# Patient Record
Sex: Male | Born: 1960
Health system: Southern US, Community
[De-identification: ages and names within clinical notes are randomized; demographics above are authoritative.]

## PROBLEM LIST (undated history)

## (undated) DIAGNOSIS — G5603 Carpal tunnel syndrome, bilateral upper limbs: Secondary | ICD-10-CM

## (undated) DIAGNOSIS — I10 Essential (primary) hypertension: Secondary | ICD-10-CM

## (undated) DIAGNOSIS — G4730 Sleep apnea, unspecified: Secondary | ICD-10-CM

## (undated) DIAGNOSIS — E7800 Pure hypercholesterolemia, unspecified: Secondary | ICD-10-CM

## (undated) DIAGNOSIS — I509 Heart failure, unspecified: Secondary | ICD-10-CM

## (undated) HISTORY — DX: Morbid (severe) obesity due to excess calories: E66.01

## (undated) HISTORY — DX: Carpal tunnel syndrome, bilateral upper limbs: G56.03

## (undated) HISTORY — DX: Sleep apnea, unspecified: G47.30

---

## 2009-01-28 HISTORY — PX: CARPAL TUNNEL RELEASE: SHX101

## 2010-09-09 ENCOUNTER — Emergency Department (INDEPENDENT_AMBULATORY_CARE_PROVIDER_SITE_OTHER): Payer: Self-pay

## 2010-09-09 ENCOUNTER — Emergency Department (HOSPITAL_BASED_OUTPATIENT_CLINIC_OR_DEPARTMENT_OTHER)
Admission: EM | Admit: 2010-09-09 | Discharge: 2010-09-09 | Disposition: A | Discharge status: Home / Self Care | Payer: Self-pay | Attending: Emergency Medicine | Admitting: Emergency Medicine

## 2010-09-09 ENCOUNTER — Emergency Department (HOSPITAL_BASED_OUTPATIENT_CLINIC_OR_DEPARTMENT_OTHER): Payer: Self-pay

## 2010-09-09 ENCOUNTER — Encounter: Payer: Self-pay | Admitting: Emergency Medicine

## 2010-09-09 DIAGNOSIS — M25569 Pain in unspecified knee: Secondary | ICD-10-CM

## 2010-09-09 DIAGNOSIS — M25461 Effusion, right knee: Secondary | ICD-10-CM

## 2010-09-09 DIAGNOSIS — M25469 Effusion, unspecified knee: Secondary | ICD-10-CM

## 2010-09-09 DIAGNOSIS — E7800 Pure hypercholesterolemia, unspecified: Secondary | ICD-10-CM | POA: Insufficient documentation

## 2010-09-09 DIAGNOSIS — R609 Edema, unspecified: Secondary | ICD-10-CM | POA: Insufficient documentation

## 2010-09-09 DIAGNOSIS — M898X9 Other specified disorders of bone, unspecified site: Secondary | ICD-10-CM

## 2010-09-09 DIAGNOSIS — I10 Essential (primary) hypertension: Secondary | ICD-10-CM | POA: Insufficient documentation

## 2010-09-09 HISTORY — DX: Essential (primary) hypertension: I10

## 2010-09-09 HISTORY — DX: Pure hypercholesterolemia, unspecified: E78.00

## 2010-09-09 MED ORDER — HYDROCODONE-ACETAMINOPHEN 5-325 MG PO TABS: 2.0000 | ORAL_TABLET | ORAL | Status: AC | PRN

## 2010-09-09 NOTE — ED Provider Notes (Signed)
Evaluation and management procedures were performed by the PA/NP under my supervision/collaboration.   Dione Booze, MD 09/09/10 [phone]

## 2010-09-09 NOTE — ED Notes (Signed)
Pt c/o RT knee pain x 4 days- no known injury.

## 2010-09-09 NOTE — ED Provider Notes (Signed)
History     CSN: [id number] Arrival date & time: 09/09/2010  1:09 PM  Chief Complaint  Patient presents with  . Knee Pain   Patient is a 50 y.o. male presenting with knee pain. The history is provided by the patient.  Knee Pain This is a new problem. The current episode started in the past 7 days. The problem occurs constantly. The problem has been gradually worsening. Associated symptoms include joint swelling and myalgias. Pertinent negatives include no weakness. The symptoms are aggravated by bending. He has tried nothing for the symptoms. The treatment provided no relief.    Past Medical History  Diagnosis Date  . Hypertension   . High cholesterol     Past Surgical History  Procedure Date  . Carpal tunnel release     No family history on file.  History  Substance Use Topics  . Smoking status: Never Smoker   . Smokeless tobacco: Not on file  . Alcohol Use: Yes     occ      Review of Systems  Musculoskeletal: Positive for myalgias and joint swelling.  Neurological: Negative for weakness.  All other systems reviewed and are negative.    Physical Exam  BP 157/106  Pulse 85  Temp(Src) 97.6 F (36.4 C) (Oral)  Resp 20  SpO2 98%  Physical Exam  Nursing note and vitals reviewed. Constitutional: He is oriented to person, place, and time. He appears well-developed and well-nourished.  HENT:  Head: Normocephalic and atraumatic.  Eyes: Pupils are equal, round, and reactive to light.  Neck: Normal range of motion.  Cardiovascular: Normal rate.   Pulmonary/Chest: Effort normal.  Musculoskeletal: He exhibits edema and tenderness.       Effusion knee,  Decreased range of motion,  No instability  Neurological: He is alert and oriented to person, place, and time. He has normal reflexes.  Skin: Skin is warm and dry.  Psychiatric: He has a normal mood and affect.    ED Course  Procedures  MDM Knee immbolizer,  Pt referred to Dr. Dion Saucier for evaluation        Langston Masker, [place] 09/09/10 1443

## 2012-03-29 ENCOUNTER — Encounter (HOSPITAL_BASED_OUTPATIENT_CLINIC_OR_DEPARTMENT_OTHER): Payer: Self-pay | Admitting: Emergency Medicine

## 2012-03-29 ENCOUNTER — Emergency Department (HOSPITAL_BASED_OUTPATIENT_CLINIC_OR_DEPARTMENT_OTHER)
Admission: EM | Admit: 2012-03-29 | Discharge: 2012-03-29 | Disposition: A | Discharge status: Home / Self Care | Payer: Self-pay | Attending: Emergency Medicine | Admitting: Emergency Medicine

## 2012-03-29 DIAGNOSIS — R05 Cough: Secondary | ICD-10-CM | POA: Insufficient documentation

## 2012-03-29 DIAGNOSIS — R0989 Other specified symptoms and signs involving the circulatory and respiratory systems: Secondary | ICD-10-CM | POA: Insufficient documentation

## 2012-03-29 DIAGNOSIS — R197 Diarrhea, unspecified: Secondary | ICD-10-CM | POA: Insufficient documentation

## 2012-03-29 DIAGNOSIS — J3489 Other specified disorders of nose and nasal sinuses: Secondary | ICD-10-CM | POA: Insufficient documentation

## 2012-03-29 DIAGNOSIS — J329 Chronic sinusitis, unspecified: Secondary | ICD-10-CM | POA: Insufficient documentation

## 2012-03-29 DIAGNOSIS — J069 Acute upper respiratory infection, unspecified: Secondary | ICD-10-CM | POA: Insufficient documentation

## 2012-03-29 DIAGNOSIS — Z862 Personal history of diseases of the blood and blood-forming organs and certain disorders involving the immune mechanism: Secondary | ICD-10-CM | POA: Insufficient documentation

## 2012-03-29 DIAGNOSIS — R059 Cough, unspecified: Secondary | ICD-10-CM | POA: Insufficient documentation

## 2012-03-29 DIAGNOSIS — R52 Pain, unspecified: Secondary | ICD-10-CM | POA: Insufficient documentation

## 2012-03-29 DIAGNOSIS — I10 Essential (primary) hypertension: Secondary | ICD-10-CM | POA: Insufficient documentation

## 2012-03-29 DIAGNOSIS — Z8639 Personal history of other endocrine, nutritional and metabolic disease: Secondary | ICD-10-CM | POA: Insufficient documentation

## 2012-03-29 DIAGNOSIS — R0609 Other forms of dyspnea: Secondary | ICD-10-CM | POA: Insufficient documentation

## 2012-03-29 MED ORDER — LEVOFLOXACIN 750 MG PO TABS: 750.0000 mg | ORAL_TABLET | Freq: Every day | ORAL | Status: DC

## 2012-03-29 MED ORDER — OXYMETAZOLINE HCL 0.05 % NA SOLN
1.0000 | Freq: Once | NASAL | Status: AC
Start: 2012-03-29 — End: 2012-03-29
  Administered 2012-03-29: 1 via NASAL
  Filled 2012-03-29: qty 15

## 2012-03-29 NOTE — ED Notes (Signed)
Pt having flu-like symptoms x 2 days.  Pt states he is having difficulty catching his breath.

## 2012-03-29 NOTE — ED Provider Notes (Signed)
History     CSN: [id number]  Arrival date & time 03/29/12  0725   First MD Initiated Contact with Patient 03/29/12 [phone]      Chief Complaint  Patient presents with  . Nasal Congestion  . Generalized Body Aches  . Diarrhea    (Consider location/radiation/quality/duration/timing/severity/associated sxs/prior treatment) HPI Comments: The patient presents with 2 days of persistent upper respiratory symptoms. Initially the patient had nasal congestion which has become severe and associated with sinus pressure and pain. He has now had an associated difficulty breathing because of a mild cough, diarrhea and generalized body aches. But has worse symptoms are nasal congestion and sinus pressure. Nothing seems to make this better or worse, there is no associated fevers or swelling or chest pain. He does not have any specific sick contacts of which she is aware. He is not a diabetic.  Patient is a 52 y.o. male presenting with diarrhea. The history is provided by the patient and the spouse.  Diarrhea   Past Medical History  Diagnosis Date  . Hypertension   . High cholesterol     Past Surgical History  Procedure Laterality Date  . Carpal tunnel release      No family history on file.  History  Substance Use Topics  . Smoking status: Never Smoker   . Smokeless tobacco: Not on file  . Alcohol Use: Yes     Comment: occ      Review of Systems  Gastrointestinal: Positive for diarrhea.  All other systems reviewed and are negative.    Allergies  Review of patient's allergies indicates no known allergies.  Home Medications   Current Outpatient Rx  Name  Route  Sig  Dispense  Refill  . levofloxacin (LEVAQUIN) 750 MG tablet   Oral   Take 1 tablet (750 mg total) by mouth daily.   7 tablet   0     BP 160/108  Pulse 99  Temp(Src) 99 F (37.2 C) (Oral)  Resp 24  Ht 5\' 10"  (1.778 m)  Wt 325 lb (147.419 kg)  BMI 46.63 kg/m2  SpO2 98%  Physical Exam  Nursing note and  vitals reviewed. Constitutional: He appears well-developed and well-nourished. No distress.  HENT:  Head: Normocephalic and atraumatic.  Oropharynx is erythematous but mucous membranes are moist. Tympanic membranes are clear bilaterally, nasal passages are significantly swollen with voluminous purulent and clear rhinorrhea. Tenderness over the sinuses bilaterally  Eyes: Conjunctivae and EOM are normal. Pupils are equal, round, and reactive to light. Right eye exhibits no discharge. Left eye exhibits no discharge. No scleral icterus.  Neck: Normal range of motion. Neck supple. No JVD present. No thyromegaly present.  Very supple neck, no lymphadenopathy  Cardiovascular: Normal rate, regular rhythm, normal heart sounds and intact distal pulses.  Exam reveals no gallop and no friction rub.   No murmur heard. Pulmonary/Chest: Effort normal and breath sounds normal. No respiratory distress. He has no wheezes. He has no rales.  Respiratory rate is normal, 18 on my exam, lungs are clear without wheezing rales or rhonchi, no increased work of breathing, speaks in full sentences  Abdominal: Soft. Bowel sounds are normal. He exhibits no distension and no mass. There is no tenderness.  Musculoskeletal: Normal range of motion. He exhibits no edema and no tenderness.  Lymphadenopathy:    He has no cervical adenopathy.  Neurological: He is alert. Coordination normal.  Skin: Skin is warm and dry. No rash noted. No erythema.  Psychiatric: He has  a normal mood and affect. His behavior is normal.    ED Course  Procedures (including critical care time)  Labs Reviewed - No data to display No results found.   1. Sinusitis   2. URI (upper respiratory infection)   3. Diarrhea       MDM  Vital signs reveal that the patient has mild hypertension, he does not have a fever nor is he tachycardic. His lungs are clear his oxygen level is 98% on room air. I will prescribe the patient a levofloxacin to treat a  possible sinus infection, the patient states that he gets very frequent sinus infections and with a sinus tenderness and significant nasal congestion he will need this. Afrin nasal spray has also been prescribed. His diarrhea is mild, he is under the understanding that this will not improved with the Levaquin but that he will need to drink plenty of fluids to stay hydrated. All questions have been answered, patient appears stable for discharge and has expressed his understanding for the indications for return.  Levaquin  Afrin        Vida Roller, MD 03/29/12 [phone]

## 2012-05-04 ENCOUNTER — Encounter: Payer: Self-pay | Admitting: Family Medicine

## 2012-05-04 ENCOUNTER — Other Ambulatory Visit: Payer: Self-pay

## 2012-05-04 ENCOUNTER — Ambulatory Visit (INDEPENDENT_AMBULATORY_CARE_PROVIDER_SITE_OTHER): Payer: Self-pay | Admitting: Family Medicine

## 2012-05-04 VITALS — BP 174/127 | HR 91 | Ht 70.0 in | Wt 356.0 lb

## 2012-05-04 DIAGNOSIS — J309 Allergic rhinitis, unspecified: Secondary | ICD-10-CM

## 2012-05-04 DIAGNOSIS — R0981 Nasal congestion: Secondary | ICD-10-CM

## 2012-05-04 DIAGNOSIS — J3489 Other specified disorders of nose and nasal sinuses: Secondary | ICD-10-CM

## 2012-05-04 DIAGNOSIS — I10 Essential (primary) hypertension: Secondary | ICD-10-CM

## 2012-05-04 DIAGNOSIS — G4710 Hypersomnia, unspecified: Secondary | ICD-10-CM

## 2012-05-04 MED ORDER — MOMETASONE FUROATE 50 MCG/ACT NA SUSP: 2.0000 | Freq: Every day | NASAL | Status: DC

## 2012-05-04 MED ORDER — MONTELUKAST SODIUM 10 MG PO TABS: 10.0000 mg | ORAL_TABLET | Freq: Every day | ORAL | Status: DC

## 2012-05-04 NOTE — Patient Instructions (Addendum)
1)  Head Congestion/Allergies:     A)  Neil Med Sinus Rinse - Distilled Water plus 1 blue packet in the 8 oz bottle. B)  Nasonex - 2 sprays inside each nostril twice a day C)  Antihistamines - Claritin, Zyrtec, Allegra (1-2 times per day) vs Chlorpheniramine/Benadryl - (1 up to 4 times per day)  D)  Decongestant - DO NOT USE DECONGESTANT SPRAY - Once we get your BP better you can try the phenylephrine 10 mg (2 tabs - 2 times per day)  E)  Singulair - 10 mg at night (Rx through Obamacare)    2)  Blood Pressure -   A) Start with the Tribenzor 1/2 tab daily for 7 days then increase to 1 tab.    3)  Blood Work - Fasting labs once your insurance is set.    DASH Diet The DASH diet stands for "Dietary Approaches to Stop Hypertension." It is a healthy eating plan that has been shown to reduce high blood pressure (hypertension) in as little as 14 days, while also possibly providing other significant health benefits. These other health benefits include reducing the risk of breast cancer after menopause and reducing the risk of type 2 diabetes, heart disease, colon cancer, and stroke. Health benefits also include weight loss and slowing kidney failure in patients with chronic kidney disease.  DIET GUIDELINES  Limit salt (sodium). Your diet should contain less than 1500 mg of sodium daily.  Limit refined or processed carbohydrates. Your diet should include mostly whole grains. Desserts and added sugars should be used sparingly.  Include small amounts of heart-healthy fats. These types of fats include nuts, oils, and tub margarine. Limit saturated and trans fats. These fats have been shown to be harmful in the body. CHOOSING FOODS  The following food groups are based on a 2000 calorie diet. See your Registered Dietitian for individual calorie needs. Grains and Grain Products (6 to 8 servings daily)  Eat More Often: Whole-wheat bread, brown rice, whole-grain or wheat pasta, quinoa, popcorn without  added fat or salt (air popped).  Eat Less Often: White bread, white pasta, white rice, cornbread. Vegetables (4 to 5 servings daily)  Eat More Often: Fresh, frozen, and canned vegetables. Vegetables may be raw, steamed, roasted, or grilled with a minimal amount of fat.  Eat Less Often/Avoid: Creamed or fried vegetables. Vegetables in a cheese sauce. Fruit (4 to 5 servings daily)  Eat More Often: All fresh, canned (in natural juice), or frozen fruits. Dried fruits without added sugar. One hundred percent fruit juice ( cup [237 mL] daily).  Eat Less Often: Dried fruits with added sugar. Canned fruit in light or heavy syrup. [employer], Fish, and Poultry (2 servings or less daily. One serving is 3 to 4 oz [85-114 g]).  Eat More Often: Ninety percent or leaner ground beef, tenderloin, sirloin. Round cuts of beef, chicken breast, [place] breast. All fish. Grill, bake, or broil your meat. Nothing should be fried.  Eat Less Often/Avoid: Fatty cuts of meat, [place], or chicken leg, thigh, or wing. Fried cuts of meat or fish. Dairy (2 to 3 servings)  Eat More Often: Low-fat or fat-free milk, low-fat plain or light yogurt, reduced-fat or part-skim cheese.  Eat Less Often/Avoid: Milk (whole, 2%).Whole milk yogurt. Full-fat cheeses. Nuts, Seeds, and Legumes (4 to 5 servings per week)  Eat More Often: All without added salt.  Eat Less Often/Avoid: Salted nuts and seeds, canned beans with added salt. Fats and Sweets (limited)  Eat More Often: Vegetable oils, tub margarines without trans fats, sugar-free gelatin. Mayonnaise and salad dressings.  Eat Less Often/Avoid: Coconut oils, palm oils, butter, stick margarine, cream, half and half, cookies, candy, pie. FOR MORE INFORMATION The Dash Diet Eating Plan: www.dashdiet.org Document Released: 01/03/2011 Document Revised: 04/08/2011 Document Reviewed: 01/03/2011 [facility] Patient Information 2013 [place], [place].

## 2012-05-04 NOTE — Progress Notes (Signed)
Subjective:     Patient ID: John Conner, male   DOB: 1960-09-24, 52 y.o.   MRN: [id number]  HPI John Conner is here today to establish care with our practice.  He found our name in the lobby of the [employer].  [facility].  He missed a few appointments with them and they discharged him from their practice.  He has been struggling with nasal/sinus congestion and difficulty breathing for about two years.  He has been diagnosed with hypertension in the past.    Review of Systems  Constitutional: Positive for fatigue (Excessive Sleepiness ).  HENT: Positive for congestion, rhinorrhea, sneezing, postnasal drip and sinus pressure.   Respiratory: Positive for apnea.   Cardiovascular: Negative for chest pain.       Objective:   Physical Exam  HENT:  Nose: Mucosal edema and rhinorrhea present.  Mouth/Throat: Oropharynx is clear and moist.  Neck: No thyromegaly present.  Cardiovascular: Normal rate, regular rhythm and normal heart sounds.   Pulmonary/Chest: Effort normal and breath sounds normal.  Lymphadenopathy:    He has no cervical adenopathy.       Assessment:     Sinus Congestion Allergic Rhinitis  Hypertension Sleep Apnea   Plan:     John Conner Med Sinus Rinse - Distilled Water plus 1 blue packet in the 8 oz bottle. B)  Nasonex - 2 sprays inside each nostril twice a day C)  Antihistamines - Claritin, Zyrtec, Allegra (1-2 times per day) vs Chlorpheniramine/Benadryl - (1 up to 4 times per day)  D)  Decongestant - DO NOT USE DECONGESTANT SPRAY - Once we get your BP better you can try the phenylephrine 10 mg (2 tabs - 2 times per day)  E)  Singulair - 10 mg at night (Rx through Obamacare)  F)  Tribenzor - 40/5/25 mg - Start with 1/2 tab and increase to 1 tab daily.   G)  Return for labs H)  We'll refer for a sleep study at his next visit.

## 2012-05-06 ENCOUNTER — Encounter: Payer: Self-pay | Admitting: Family Medicine

## 2012-05-06 DIAGNOSIS — R0981 Nasal congestion: Secondary | ICD-10-CM | POA: Insufficient documentation

## 2012-05-06 DIAGNOSIS — G4710 Hypersomnia, unspecified: Secondary | ICD-10-CM | POA: Insufficient documentation

## 2012-05-06 DIAGNOSIS — I10 Essential (primary) hypertension: Secondary | ICD-10-CM | POA: Insufficient documentation

## 2012-05-06 DIAGNOSIS — J309 Allergic rhinitis, unspecified: Secondary | ICD-10-CM | POA: Insufficient documentation

## 2012-06-03 ENCOUNTER — Other Ambulatory Visit: Payer: Self-pay

## 2012-06-10 ENCOUNTER — Ambulatory Visit: Payer: Self-pay | Admitting: Family Medicine

## 2012-06-10 DIAGNOSIS — Z0289 Encounter for other administrative examinations: Secondary | ICD-10-CM

## 2013-03-27 ENCOUNTER — Encounter (HOSPITAL_BASED_OUTPATIENT_CLINIC_OR_DEPARTMENT_OTHER): Payer: Self-pay | Admitting: Emergency Medicine

## 2013-03-27 ENCOUNTER — Emergency Department (HOSPITAL_BASED_OUTPATIENT_CLINIC_OR_DEPARTMENT_OTHER)
Admission: EM | Admit: 2013-03-27 | Discharge: 2013-03-27 | Disposition: A | Discharge status: Home / Self Care | Payer: Self-pay | Attending: Emergency Medicine | Admitting: Emergency Medicine

## 2013-03-27 DIAGNOSIS — L259 Unspecified contact dermatitis, unspecified cause: Secondary | ICD-10-CM | POA: Insufficient documentation

## 2013-03-27 DIAGNOSIS — Z8669 Personal history of other diseases of the nervous system and sense organs: Secondary | ICD-10-CM | POA: Insufficient documentation

## 2013-03-27 DIAGNOSIS — I10 Essential (primary) hypertension: Secondary | ICD-10-CM | POA: Insufficient documentation

## 2013-03-27 DIAGNOSIS — L309 Dermatitis, unspecified: Secondary | ICD-10-CM

## 2013-03-27 DIAGNOSIS — L853 Xerosis cutis: Secondary | ICD-10-CM

## 2013-03-27 DIAGNOSIS — Z87891 Personal history of nicotine dependence: Secondary | ICD-10-CM | POA: Insufficient documentation

## 2013-03-27 LAB — CBG MONITORING, ED: Glucose-Capillary: 117 mg/dL — ABNORMAL HIGH (ref 70–99)

## 2013-03-27 NOTE — ED Notes (Signed)
Patient here with tingling to hands and the bottom of his feet x 3 days. Reports that the symptoms were worse last night. Denies trauma, denies diabetes. States that he is not taking meds for his BP. No other associated symptoms.

## 2013-03-27 NOTE — Discharge Instructions (Signed)
Rash  A rash is a change in the color or feel of your skin. There are many different types of rashes. You may have other problems along with your rash.  HOME CARE  · Avoid the thing that caused your rash.  · Do not scratch your rash.  · You may take cools baths to help stop itching.  · Only take medicines as told by your doctor.  · Keep all doctor visits as told.  GET HELP RIGHT AWAY IF:   · Your pain, puffiness (swelling), or redness gets worse.  · You have a fever.  · You have new or severe problems.  · You have body aches, watery poop (diarrhea), or you throw up (vomit).  · Your rash is not better after 3 days.  MAKE SURE YOU:   · Understand these instructions.  · Will watch your condition.  · Will get help right away if you are not doing well or get worse.  Document Released: 07/03/2007 Document Revised: 04/08/2011 Document Reviewed: 10/29/2010  [employer]® Patient Information ©2014 [employer].

## 2013-03-27 NOTE — ED Provider Notes (Signed)
CSN: [id number]     Arrival date & time 03/27/13  0706 History   First MD Initiated Contact with Patient 03/27/13 0719     Chief Complaint  Patient presents with  . Tingling     (Consider location/radiation/quality/duration/timing/severity/associated sxs/prior Treatment) HPI Comments: She presents with complaints of burning, itching and tingling of both of his hands and feet for the last 2 or 3 days he has not noticed any rash. No new skin products have been used. Patient is concerned because his father had diabetes and he wants to know if he has diabetes.   Past Medical History  Diagnosis Date  . Hypertension   . High cholesterol   . Carpal tunnel syndrome on both sides   . Sleep apnea   . Morbid obesity    Past Surgical History  Procedure Laterality Date  . Carpal tunnel release  2011   Family History  Problem Relation Age of Onset  . Diabetes Father   . Hyperlipidemia Father   . Hypertension Father    History  Substance Use Topics  . Smoking status: Former Smoker    Types: Cigarettes    Quit date: 01/28/2005  . Smokeless tobacco: Not on file  . Alcohol Use: No     Comment: occ    Review of Systems  Skin:       Itching  All other systems reviewed and are negative.      Allergies  Review of patient's allergies indicates no known allergies.  Home Medications  No current outpatient prescriptions on file. BP 189/91  Pulse 83  Temp(Src) 98.7 F (37.1 C) (Oral)  Resp 20  Ht 5\' 11"  (1.803 m)  Wt 360 lb (163.295 kg)  BMI 50.23 kg/m2  SpO2 96% Physical Exam  Constitutional: He is oriented to person, place, and time. He appears well-developed and well-nourished. No distress.  HENT:  Head: Normocephalic and atraumatic.  Right Ear: Hearing normal.  Left Ear: Hearing normal.  Nose: Nose normal.  Mouth/Throat: Oropharynx is clear and moist and mucous membranes are normal.  Eyes: Conjunctivae and EOM are normal. Pupils are equal, round, and reactive to  light.  Neck: Normal range of motion. Neck supple.  Cardiovascular: Regular rhythm, S1 normal and S2 normal.  Exam reveals no gallop and no friction rub.   No murmur heard. Pulses:      Dorsalis pedis pulses are 2+ on the right side, and 2+ on the left side.  Pulmonary/Chest: Effort normal and breath sounds normal. No respiratory distress. He exhibits no tenderness.  Abdominal: Soft. Normal appearance and bowel sounds are normal. There is no hepatosplenomegaly. There is no tenderness. There is no rebound, no guarding, no tenderness at McBurney's point and negative Murphy's sign. No hernia.  Musculoskeletal: Normal range of motion.  Neurological: He is alert and oriented to person, place, and time. He has normal strength. No cranial nerve deficit or sensory deficit. Coordination normal. GCS eye subscore is 4. GCS verbal subscore is 5. GCS motor subscore is 6.  Skin: Skin is warm, dry and intact. No rash noted. No cyanosis.  Dryness, scaling, cracking of the skin of bilateral hands and feet  No sequela of scabies  No signs of tinea pedis  Psychiatric: He has a normal mood and affect. His speech is normal and behavior is normal. Thought content normal.    ED Course  Procedures (including critical care time) Labs Review Labs Reviewed  CBG MONITORING, ED - Abnormal; Notable for the following:  Glucose-Capillary 117 (*)    All other components within normal limits   Imaging Review No results found.   EKG Interpretation None      MDM   Final diagnoses:  Dermatitis  Dry skin dermatitis    Random blood glucose was 117. No concern for diabetes or diabetic neuropathy. No obvious rashes noted other than what appears to be significantly dry skin. Patient has normal distal blood flow. Normal neurologic function. Pulses are intact. No sign of fungal infections. No sign of bacterial infection. Patient's symptoms are likely secondary to significantly dry skin, I recommended moisturization.  Patient was reassured that his blood sugar was normal.    Orpah Greek, MD 03/27/13 [phone]

## 2013-08-24 ENCOUNTER — Emergency Department (HOSPITAL_BASED_OUTPATIENT_CLINIC_OR_DEPARTMENT_OTHER)
Admission: EM | Admit: 2013-08-24 | Discharge: 2013-08-24 | Disposition: A | Discharge status: Home / Self Care | Payer: Self-pay | Attending: Emergency Medicine | Admitting: Emergency Medicine

## 2013-08-24 ENCOUNTER — Encounter (HOSPITAL_BASED_OUTPATIENT_CLINIC_OR_DEPARTMENT_OTHER): Payer: Self-pay | Admitting: Emergency Medicine

## 2013-08-24 DIAGNOSIS — G5600 Carpal tunnel syndrome, unspecified upper limb: Secondary | ICD-10-CM | POA: Insufficient documentation

## 2013-08-24 DIAGNOSIS — K0889 Other specified disorders of teeth and supporting structures: Secondary | ICD-10-CM

## 2013-08-24 DIAGNOSIS — K089 Disorder of teeth and supporting structures, unspecified: Secondary | ICD-10-CM | POA: Insufficient documentation

## 2013-08-24 DIAGNOSIS — Z87891 Personal history of nicotine dependence: Secondary | ICD-10-CM | POA: Insufficient documentation

## 2013-08-24 DIAGNOSIS — I10 Essential (primary) hypertension: Secondary | ICD-10-CM | POA: Insufficient documentation

## 2013-08-24 DIAGNOSIS — Z79899 Other long term (current) drug therapy: Secondary | ICD-10-CM | POA: Insufficient documentation

## 2013-08-24 LAB — URINALYSIS, ROUTINE W REFLEX MICROSCOPIC
BILIRUBIN URINE: NEGATIVE
Glucose, UA: NEGATIVE mg/dL
HGB URINE DIPSTICK: NEGATIVE
KETONES UR: NEGATIVE mg/dL
Nitrite: NEGATIVE
PH: 6 (ref 5.0–8.0)
Protein, ur: NEGATIVE mg/dL
SPECIFIC GRAVITY, URINE: 1.022 (ref 1.005–1.030)
Urobilinogen, UA: 1 mg/dL (ref 0.0–1.0)

## 2013-08-24 LAB — URINE MICROSCOPIC-ADD ON

## 2013-08-24 LAB — BASIC METABOLIC PANEL
Anion gap: 13 (ref 5–15)
BUN: 10 mg/dL (ref 6–23)
CALCIUM: 9.3 mg/dL (ref 8.4–10.5)
CHLORIDE: 102 meq/L (ref 96–112)
CO2: 26 meq/L (ref 19–32)
CREATININE: 1.2 mg/dL (ref 0.50–1.35)
GFR calc Af Amer: 78 mL/min — ABNORMAL LOW (ref >90)
GFR calc non Af Amer: 67 mL/min — ABNORMAL LOW (ref >90)
Glucose, Bld: 136 mg/dL — ABNORMAL HIGH (ref 70–99)
Potassium: 4.2 mEq/L (ref 3.7–5.3)
Sodium: 141 mEq/L (ref 137–147)

## 2013-08-24 MED ORDER — HYDROCHLOROTHIAZIDE 25 MG PO TABS: 25.0000 mg | ORAL_TABLET | Freq: Every day | ORAL | Status: DC

## 2013-08-24 MED ORDER — OXYCODONE-ACETAMINOPHEN 5-325 MG PO TABS: 1.0000 | ORAL_TABLET | Freq: Four times a day (QID) | ORAL | Status: DC | PRN

## 2013-08-24 MED ORDER — LISINOPRIL 20 MG PO TABS: 20.0000 mg | ORAL_TABLET | Freq: Every day | ORAL | Status: DC

## 2013-08-24 MED ORDER — OXYCODONE-ACETAMINOPHEN 5-325 MG PO TABS
1.0000 | ORAL_TABLET | Freq: Once | ORAL | Status: AC
  Administered 2013-08-24: 1 via ORAL
  Filled 2013-08-24: qty 1

## 2013-08-24 NOTE — Discharge Instructions (Signed)
Return to the ED with any concerns including difficulty breathing or swallowing, fever/chills, chest pain, fainting, severe headache, numbness or weakness, decreased level of alertness/lethargy, or any other alarming symptoms  You should be sure to take your blood pressure medication as prescribed and arrange for a primary doctor followup appointment to get further BP medication prescriptions

## 2013-08-24 NOTE — ED Notes (Signed)
Pt reports tooth pain and facial pain on right side that started yesterday.

## 2013-08-24 NOTE — ED Provider Notes (Signed)
CSN: [id number]     Arrival date & time 08/24/13  4098 History   First MD Initiated Contact with Patient 08/24/13 0703     Chief Complaint  Patient presents with  . Dental Pain     (Consider location/radiation/quality/duration/timing/severity/associated sxs/prior Treatment) HPI Pt presenting with c/o tooth and facial pain that began yesterday.  Symptoms are constant.  He has not had any broken tooth or injury to tooth.  Pain is in right upper molar.  Pt also states he has not been  taking his blood pressure medication.  No chest pain, no difficulty breathing, no severe headache.  No fever/chills.  No difficulty swallowing.  He states he does not have a dentist.  There are no other associated systemic symptoms, there are no other alleviating or modifying factors.   Past Medical History  Diagnosis Date  . Hypertension   . High cholesterol   . Carpal tunnel syndrome on both sides   . Sleep apnea   . Morbid obesity    Past Surgical History  Procedure Laterality Date  . Carpal tunnel release  2011   Family History  Problem Relation Age of Onset  . Diabetes Father   . Hyperlipidemia Father   . Hypertension Father    History  Substance Use Topics  . Smoking status: Former Smoker    Types: Cigarettes    Quit date: 01/28/2005  . Smokeless tobacco: Not on file  . Alcohol Use: No     Comment: occ    Review of Systems ROS reviewed and all otherwise negative except for mentioned in HPI    Allergies  Review of patient's allergies indicates no known allergies.  Home Medications   Prior to Admission medications   Medication Sig Start Date End Date Taking? Authorizing Provider  hydrochlorothiazide (HYDRODIURIL) 25 MG tablet Take 1 tablet (25 mg total) by mouth daily. 08/24/13   Threasa Beards, MD  lisinopril (PRINIVIL,ZESTRIL) 20 MG tablet Take 1 tablet (20 mg total) by mouth daily. 08/24/13   Threasa Beards, MD  oxyCODONE-acetaminophen (PERCOCET/ROXICET) 5-325 MG per tablet  Take 1-2 tablets by mouth every 6 (six) hours as needed for severe pain. 08/24/13   Threasa Beards, MD   BP 145/113  Pulse 81  Temp(Src) 97.9 F (36.6 C) (Oral)  Resp 18  Ht 5\' 10"  (1.778 m)  Wt 325 lb (147.419 kg)  BMI 46.63 kg/m2  SpO2 96% Vitals reviewed Physical Exam Physical Examination: General appearance - alert, well appearing, and in no distress Mental status - alert, oriented to person, place, and time Eyes - no conjunctival injection, no scleral icterus Mouth - mucous membranes moist, pharynx normal without lesions, no dental decay present on exam, no gingival or periapical abscess Neck - supple, no significant adenopathy Chest - clear to auscultation, no wheezes, rales or rhonchi, symmetric air entry Heart - normal rate, regular rhythm, normal S1, S2, no murmurs, rubs, clicks or gallops Abdomen - soft, nontender, nondistended, no masses or organomegaly Extremities - peripheral pulses normal, no pedal edema, no clubbing or cyanosis Skin - normal coloration and turgor, no rashes  ED Course  Procedures (including critical care time) Labs Review Labs Reviewed  BASIC METABOLIC PANEL - Abnormal; Notable for the following:    Glucose, Bld 136 (*)    GFR calc non Af Amer 67 (*)    GFR calc Af Amer 78 (*)    All other components within normal limits  URINALYSIS, ROUTINE W REFLEX MICROSCOPIC - Abnormal; Notable for  the following:    APPearance CLOUDY (*)    Leukocytes, UA MODERATE (*)    All other components within normal limits  URINE MICROSCOPIC-ADD ON - Abnormal; Notable for the following:    Squamous Epithelial / LPF FEW (*)    Bacteria, UA FEW (*)    All other components within normal limits  URINE CULTURE    Imaging Review No results found.   EKG Interpretation   Date/Time:  Tuesday August 24 2013 07:28:10 EDT Ventricular Rate:  67 PR Interval:  176 QRS Duration: 98 QT Interval:  384 QTC Calculation: 405 R Axis:   -39 Text Interpretation:  Normal sinus  rhythm with sinus arrhythmia Possible  Left atrial enlargement Left axis deviation Incomplete right bundle branch  block Nonspecific T wave abnormality Abnormal ECG No old tracing to  compare Confirmed by [facility]  MD, [place] [phone]) on 08/24/2013 2:29:18 PM      MDM   Final diagnoses:  Pain, dental  Essential hypertension    Pt  Presenting with c/o right upper molar tooth pain, also hypertensive and does not have his BP medications.  Workup reveals no evidence of end organ damage.  Pt given rx for his blood pressure medications and encouraged to arrange for primary care followup for continued management of his blood pressure.  Also given information for dental followup  Discharged with strict return precautions.  Pt agreeable with plan.    Threasa Beards, MD 08/26/13 [phone]

## 2013-08-25 LAB — URINE CULTURE

## 2014-06-04 ENCOUNTER — Emergency Department (HOSPITAL_BASED_OUTPATIENT_CLINIC_OR_DEPARTMENT_OTHER)
Admission: EM | Admit: 2014-06-04 | Discharge: 2014-06-04 | Disposition: A | Discharge status: Home / Self Care | Payer: Self-pay | Attending: Emergency Medicine | Admitting: Emergency Medicine

## 2014-06-04 ENCOUNTER — Encounter (HOSPITAL_BASED_OUTPATIENT_CLINIC_OR_DEPARTMENT_OTHER): Payer: Self-pay

## 2014-06-04 ENCOUNTER — Emergency Department (HOSPITAL_BASED_OUTPATIENT_CLINIC_OR_DEPARTMENT_OTHER): Payer: Self-pay

## 2014-06-04 DIAGNOSIS — I10 Essential (primary) hypertension: Secondary | ICD-10-CM | POA: Insufficient documentation

## 2014-06-04 DIAGNOSIS — Z8669 Personal history of other diseases of the nervous system and sense organs: Secondary | ICD-10-CM | POA: Insufficient documentation

## 2014-06-04 DIAGNOSIS — H578 Other specified disorders of eye and adnexa: Secondary | ICD-10-CM | POA: Insufficient documentation

## 2014-06-04 DIAGNOSIS — M791 Myalgia: Secondary | ICD-10-CM | POA: Insufficient documentation

## 2014-06-04 DIAGNOSIS — Z79899 Other long term (current) drug therapy: Secondary | ICD-10-CM | POA: Insufficient documentation

## 2014-06-04 DIAGNOSIS — Z87891 Personal history of nicotine dependence: Secondary | ICD-10-CM | POA: Insufficient documentation

## 2014-06-04 DIAGNOSIS — B349 Viral infection, unspecified: Secondary | ICD-10-CM | POA: Insufficient documentation

## 2014-06-04 MED ORDER — CETIRIZINE HCL 10 MG PO TABS: 10.0000 mg | ORAL_TABLET | Freq: Every day | ORAL | Status: DC

## 2014-06-04 MED ORDER — BENZONATATE 100 MG PO CAPS
200.0000 mg | ORAL_CAPSULE | Freq: Once | ORAL | Status: AC
  Administered 2014-06-04: 200 mg via ORAL
  Filled 2014-06-04: qty 2

## 2014-06-04 MED ORDER — BENZONATATE 100 MG PO CAPS: 100.0000 mg | ORAL_CAPSULE | Freq: Three times a day (TID) | ORAL | Status: DC

## 2014-06-04 MED ORDER — NAPROXEN 375 MG PO TABS: 375.0000 mg | ORAL_TABLET | Freq: Two times a day (BID) | ORAL | Status: DC

## 2014-06-04 MED ORDER — FLUTICASONE PROPIONATE 50 MCG/ACT NA SUSP: 2.0000 | Freq: Every day | NASAL | Status: DC

## 2014-06-04 MED ORDER — LORATADINE 10 MG PO TABS
10.0000 mg | ORAL_TABLET | Freq: Once | ORAL | Status: AC
  Administered 2014-06-04: 10 mg via ORAL
  Filled 2014-06-04: qty 1

## 2014-06-04 MED ORDER — NAPROXEN 250 MG PO TABS
500.0000 mg | ORAL_TABLET | Freq: Once | ORAL | Status: AC
  Administered 2014-06-04: 500 mg via ORAL
  Filled 2014-06-04: qty 2

## 2014-06-04 NOTE — ED Notes (Signed)
Pt reports since yesterday with congestion, cough and sob, body aches, sore throat.  Denies fever.

## 2014-06-04 NOTE — ED Provider Notes (Signed)
CSN: [id number]     Arrival date & time 06/04/14  2213 History  This chart was scribed for John Mickelson, MD by Chester Holstein, ED Scribe. This patient was seen in room MH12/MH12 and the patient's care was started at 11:06 PM.    Chief Complaint  Patient presents with  . Influenza     Patient is a 54 y.o. male presenting with flu symptoms. The history is provided by the patient and the spouse. History limited by: falls back to sleep during history and exam. No language interpreter was used.  Influenza Presenting symptoms: cough, myalgias and sore throat   Presenting symptoms: no diarrhea, no fatigue, no fever, no shortness of breath and no vomiting   Severity:  Mild Onset quality:  Gradual Progression:  Unchanged Chronicity:  New Relieved by:  Nothing Worsened by:  Nothing tried Ineffective treatments:  OTC medications Associated symptoms: nasal congestion   Associated symptoms: no chills, no decreased appetite, no decrease in physical activity, no mental status change, no neck stiffness and no witnessed syncope   Risk factors: no liver disease    HPI Comments: John Conner is a 54 y.o. male who presents to the Emergency Department complaining of congestion with onset last night. Pt notes associated itchy eyes, cough, generalized body aches, and mild sore throat which has resolved. Pt took Benadryl for relief. Pt sleeping in exam room. Pt denies fever and chills.   Past Medical History  Diagnosis Date  . Hypertension   . High cholesterol   . Carpal tunnel syndrome on both sides   . Sleep apnea   . Morbid obesity    Past Surgical History  Procedure Laterality Date  . Carpal tunnel release  2011   Family History  Problem Relation Age of Onset  . Diabetes Father   . Hyperlipidemia Father   . Hypertension Father    History  Substance Use Topics  . Smoking status: Former Smoker    Types: Cigarettes    Quit date: 01/28/2005  . Smokeless tobacco: Not on file  . Alcohol  Use: Yes     Comment: occ    Review of Systems  Constitutional: Negative for fever, chills, fatigue and decreased appetite.  HENT: Positive for congestion and sore throat. Negative for dental problem and drooling.   Eyes: Positive for itching.  Respiratory: Positive for cough. Negative for shortness of breath.   Cardiovascular: Negative for chest pain.  Gastrointestinal: Negative for vomiting and diarrhea.  Musculoskeletal: Positive for myalgias. Negative for neck stiffness.  All other systems reviewed and are negative.     Allergies  Review of patient's allergies indicates no known allergies.  Home Medications   Prior to Admission medications   Medication Sig Start Date End Date Taking? Authorizing Provider  hydrochlorothiazide (HYDRODIURIL) 25 MG tablet Take 1 tablet (25 mg total) by mouth daily. 08/24/13   Alfonzo Beers, MD  lisinopril (PRINIVIL,ZESTRIL) 20 MG tablet Take 1 tablet (20 mg total) by mouth daily. 08/24/13   Alfonzo Beers, MD  oxyCODONE-acetaminophen (PERCOCET/ROXICET) 5-325 MG per tablet Take 1-2 tablets by mouth every 6 (six) hours as needed for severe pain. 08/24/13   Alfonzo Beers, MD   BP 162/110 mmHg  Pulse 95  Temp(Src) 98.4 F (36.9 C) (Oral)  Resp 26  Ht 5\' 10"  (1.778 m)  Wt 310 lb (140.615 kg)  BMI 44.48 kg/m2  SpO2 96% Physical Exam  Constitutional: He is oriented to person, place, and time. He appears well-developed and well-nourished. No distress.  HENT:  Head: Normocephalic.  Mouth/Throat: Uvula is midline, oropharynx is clear and moist and mucous membranes are normal. No oropharyngeal exudate.  Clear colored post nasal drip  Eyes: Conjunctivae and EOM are normal. Pupils are equal, round, and reactive to light.  Neck: Normal range of motion. Neck supple. No tracheal deviation present.  Cardiovascular: Normal rate, regular rhythm, normal heart sounds and intact distal pulses.   Pulmonary/Chest: Effort normal and breath sounds normal. No stridor.  No respiratory distress. He has no wheezes. He has no rales.  Abdominal: Soft. Bowel sounds are normal. There is no tenderness. There is no rebound and no guarding.  Musculoskeletal: Normal range of motion.  Lymphadenopathy:    He has no cervical adenopathy.  Neurological: He is alert and oriented to person, place, and time. He has normal reflexes.  Skin: Skin is warm and dry. No rash noted.  Psychiatric: He has a normal mood and affect. His behavior is normal.  Nursing note and vitals reviewed.   ED Course  Procedures (including critical care time) DIAGNOSTIC STUDIES: Oxygen Saturation is 96% on room air, normal by my interpretation.    COORDINATION OF CARE: 11:07 PM Discussed treatment plan with patient at beside, the patient agrees with the plan and has no further questions at this time.   Labs Review Labs Reviewed - No data to display  Imaging Review No results found.   EKG Interpretation None      MDM   Final diagnoses:  None  Viral syndrome: Complaining of pain but sound asleep in the room.  URI will treat symptomatically.      I personally performed the services described in this documentation, which was scribed in my presence. The recorded information has been reviewed and is accurate.      Veatrice Kells, MD 06/05/14 [phone]

## 2014-06-05 ENCOUNTER — Encounter (HOSPITAL_BASED_OUTPATIENT_CLINIC_OR_DEPARTMENT_OTHER): Payer: Self-pay | Admitting: Emergency Medicine

## 2014-08-27 ENCOUNTER — Emergency Department (HOSPITAL_BASED_OUTPATIENT_CLINIC_OR_DEPARTMENT_OTHER): Payer: Self-pay

## 2014-08-27 ENCOUNTER — Encounter (HOSPITAL_BASED_OUTPATIENT_CLINIC_OR_DEPARTMENT_OTHER): Payer: Self-pay | Admitting: Emergency Medicine

## 2014-08-27 ENCOUNTER — Emergency Department (HOSPITAL_BASED_OUTPATIENT_CLINIC_OR_DEPARTMENT_OTHER)
Admission: EM | Admit: 2014-08-27 | Discharge: 2014-08-27 | Disposition: A | Discharge status: Home / Self Care | Payer: Self-pay | Attending: Emergency Medicine | Admitting: Emergency Medicine

## 2014-08-27 DIAGNOSIS — I10 Essential (primary) hypertension: Secondary | ICD-10-CM | POA: Insufficient documentation

## 2014-08-27 DIAGNOSIS — M25561 Pain in right knee: Secondary | ICD-10-CM

## 2014-08-27 DIAGNOSIS — Z87891 Personal history of nicotine dependence: Secondary | ICD-10-CM | POA: Insufficient documentation

## 2014-08-27 DIAGNOSIS — M25461 Effusion, right knee: Secondary | ICD-10-CM | POA: Insufficient documentation

## 2014-08-27 DIAGNOSIS — Z8669 Personal history of other diseases of the nervous system and sense organs: Secondary | ICD-10-CM | POA: Insufficient documentation

## 2014-08-27 DIAGNOSIS — Z79899 Other long term (current) drug therapy: Secondary | ICD-10-CM | POA: Insufficient documentation

## 2014-08-27 MED ORDER — PREDNISONE 20 MG PO TABS: 60.0000 mg | ORAL_TABLET | Freq: Every day | ORAL | Status: DC

## 2014-08-27 MED ORDER — IBUPROFEN 800 MG PO TABS
800.0000 mg | ORAL_TABLET | Freq: Once | ORAL | Status: AC
  Administered 2014-08-27: 800 mg via ORAL
  Filled 2014-08-27: qty 1

## 2014-08-27 MED ORDER — OXYCODONE-ACETAMINOPHEN 5-325 MG PO TABS
1.0000 | ORAL_TABLET | Freq: Once | ORAL | Status: AC
  Administered 2014-08-27: 1 via ORAL
  Filled 2014-08-27: qty 1

## 2014-08-27 MED ORDER — OXYCODONE-ACETAMINOPHEN 5-325 MG PO TABS: 1.0000 | ORAL_TABLET | ORAL | Status: DC | PRN

## 2014-08-27 MED ORDER — PREDNISONE 50 MG PO TABS
60.0000 mg | ORAL_TABLET | Freq: Once | ORAL | Status: AC
  Administered 2014-08-27: 60 mg via ORAL
  Filled 2014-08-27 (×2): qty 1

## 2014-08-27 NOTE — ED Provider Notes (Signed)
CSN: [id number]     Arrival date & time 08/27/14  0539 History   First MD Initiated Contact with Patient 08/27/14 [phone]     Chief Complaint  Patient presents with  . Leg Swelling     (Consider location/radiation/quality/duration/timing/severity/associated sxs/prior Treatment) The history is provided by the patient.   54 year old male comes in complaining of right knee pain and swelling for the last 3 days. He denies any trauma. He states that couple of years ago, he had similar pain in his left knee. It is painful to walk and painful to touch. He rates pain at 9/10. He has been ambulating with a cane.  Past Medical History  Diagnosis Date  . Hypertension   . High cholesterol   . Carpal tunnel syndrome on both sides   . Sleep apnea   . Morbid obesity    Past Surgical History  Procedure Laterality Date  . Carpal tunnel release  2011   Family History  Problem Relation Age of Onset  . Diabetes Father   . Hyperlipidemia Father   . Hypertension Father    History  Substance Use Topics  . Smoking status: Former Smoker    Types: Cigarettes    Quit date: 01/28/2005  . Smokeless tobacco: Not on file  . Alcohol Use: Yes     Comment: occ    Review of Systems  All other systems reviewed and are negative.     Allergies  Review of patient's allergies indicates no known allergies.  Home Medications   Prior to Admission medications   Medication Sig Start Date End Date Taking? Authorizing Provider  hydrochlorothiazide (HYDRODIURIL) 25 MG tablet Take 1 tablet (25 mg total) by mouth daily. 08/24/13  Yes Alfonzo Beers, MD  lisinopril (PRINIVIL,ZESTRIL) 20 MG tablet Take 1 tablet (20 mg total) by mouth daily. 08/24/13  Yes Alfonzo Beers, MD  oxyCODONE-acetaminophen (PERCOCET) 5-325 MG per tablet Take 1 tablet by mouth every 4 (four) hours as needed for moderate pain. 9/51/88   Delora Fuel, MD  predniSONE (DELTASONE) 20 MG tablet Take 3 tablets (60 mg total) by mouth daily. 05/14/58    Delora Fuel, MD   BP 630/16 mmHg  Pulse 68  Temp(Src) 98.3 F (36.8 C) (Oral)  Resp 18  Ht 5\' 10"  (1.778 m)  Wt 325 lb (147.419 kg)  BMI 46.63 kg/m2  SpO2 100% Physical Exam  Nursing note and vitals reviewed.  Morbidly obese 54 year old male, resting comfortably and in no acute distress. Vital signs are significant for borderline hypertension. Oxygen saturation is 100%, which is normal. Head is normocephalic and atraumatic. PERRLA, EOMI. Oropharynx is clear. There is no sinus tenderness. Nasal cavity shows no edema of turbinates and no drainage. Neck is nontender and supple without adenopathy or JVD. Back is nontender and there is no CVA tenderness. Lungs are clear without rales, wheezes, or rhonchi. Chest is nontender. Heart has regular rate and rhythm without murmur. Abdomen is soft, flat, nontender without masses or hepatosplenomegaly and peristalsis is normoactive. Extremities have 1+ edema with venous stasis changes bilaterally. Right knee is swollen without and with a suprapatellar effusion, but no definite joint effusion. It is slightly warm to the touch compared with the left. Skin is warm and dry without rash. Neurologic: Mental status is normal, cranial nerves are intact, there are no motor or sensory deficits.  ED Course  Procedures (including critical care time)  MDM   Final diagnoses:  Pain and swelling of right knee    Right  knee pain which probably represents osteoarthritis. Consider possibility of gout. Without history of trauma no imaging is indicated. Old records are reviewed and he had asked she been seen for right knee effusion in 4 years ago. He had been referred to orthopedics for follow-up but never did follow-up. He will be treated with a short course of steroid-induced as well as NSAIDs and painkillers. He has a knee immobilizer from his previous visits and is told to use that as needed. Used a cane as needed. He is given prescriptions for prednisone and  oxycodone-acetaminophen and told to use over-the-counter naproxen.    Delora Fuel, MD 28/36/62 [id number]

## 2014-08-27 NOTE — ED Notes (Signed)
Pt with increased swelling to right knee x 3 days, painful to touch and walk on it

## 2014-08-27 NOTE — Discharge Instructions (Signed)
Wear your immobilizer as needed. Take two naproxen tablets at a time, twice a day.  Osteoarthritis Osteoarthritis is a disease that causes soreness and inflammation of a joint. It occurs when the cartilage at the affected joint wears down. Cartilage acts as a cushion, covering the ends of bones where they meet to form a joint. Osteoarthritis is the most common form of arthritis. It often occurs in older people. The joints affected most often by this condition include those in the:  Ends of the fingers.  Thumbs.  Neck.  Lower back.  Knees.  Hips. CAUSES  Over time, the cartilage that covers the ends of bones begins to wear away. This causes bone to rub on bone, producing pain and stiffness in the affected joints.  RISK FACTORS Certain factors can increase your chances of having osteoarthritis, including:  Older age.  Excessive body weight.  Overuse of joints.  Previous joint injury. SIGNS AND SYMPTOMS   Pain, swelling, and stiffness in the joint.  Over time, the joint may lose its normal shape.  Small deposits of bone (osteophytes) may grow on the edges of the joint.  Bits of bone or cartilage can break off and float inside the joint space. This may cause more pain and damage. DIAGNOSIS  Your health care provider will do a physical exam and ask about your symptoms. Various tests may be ordered, such as:  X-rays of the affected joint.  An MRI scan.  Blood tests to rule out other types of arthritis.  Joint fluid tests. This involves using a needle to draw fluid from the joint and examining the fluid under a microscope. TREATMENT  Goals of treatment are to control pain and improve joint function. Treatment plans may include:  A prescribed exercise program that allows for rest and joint relief.  A weight control plan.  Pain relief techniques, such as:  Properly applied heat and cold.  Electric pulses delivered to nerve endings under the skin (transcutaneous  electrical nerve stimulation [TENS]).  Massage.  Certain nutritional supplements.  Medicines to control pain, such as:  Acetaminophen.  Nonsteroidal anti-inflammatory drugs (NSAIDs), such as naproxen.  Narcotic or central-acting agents, such as tramadol.  Corticosteroids. These can be given orally or as an injection.  Surgery to reposition the bones and relieve pain (osteotomy) or to remove loose pieces of bone and cartilage. Joint replacement may be needed in advanced states of osteoarthritis. HOME CARE INSTRUCTIONS   Take medicines only as directed by your health care provider.  Maintain a healthy weight. Follow your health care provider's instructions for weight control. This may include dietary instructions.  Exercise as directed. Your health care provider can recommend specific types of exercise. These may include:  Strengthening exercises. These are done to strengthen the muscles that support joints affected by arthritis. They can be performed with weights or with exercise bands to add resistance.  Aerobic activities. These are exercises, such as brisk walking or low-impact aerobics, that get your heart pumping.  Range-of-motion activities. These keep your joints limber.  Balance and agility exercises. These help you maintain daily living skills.  Rest your affected joints as directed by your health care provider.  Keep all follow-up visits as directed by your health care provider. SEEK MEDICAL CARE IF:   Your skin turns red.  You develop a rash in addition to your joint pain.  You have worsening joint pain.  You have a fever along with joint or muscle aches. SEEK IMMEDIATE MEDICAL CARE IF:  You have a significant loss of weight or appetite.  You have night sweats. [place] of Arthritis and Musculoskeletal and Skin Diseases: www.niams.SouthExposed.es  [employer] on Aging: http://kim-miller.com/  American College of Rheumatology:  www.rheumatology.org Document Released: 01/14/2005 Document Revised: 05/31/2013 Document Reviewed: 09/21/2012 [facility] Patient Information 2015 [place], [place]. This information is not intended to replace advice given to you by your health care provider. Make sure you discuss any questions you have with your health care provider.  Gout Gout is an inflammatory arthritis caused by a buildup of uric acid crystals in the joints. Uric acid is a chemical that is normally present in the blood. When the level of uric acid in the blood is too high it can form crystals that deposit in your joints and tissues. This causes joint redness, soreness, and swelling (inflammation). Repeat attacks are common. Over time, uric acid crystals can form into masses (tophi) near a joint, destroying bone and causing disfigurement. Gout is treatable and often preventable. CAUSES  The disease begins with elevated levels of uric acid in the blood. Uric acid is produced by your body when it breaks down a naturally found substance called purines. Certain foods you eat, such as meats and fish, contain high amounts of purines. Causes of an elevated uric acid level include:  Being passed down from parent to child (heredity).  Diseases that cause increased uric acid production (such as obesity, psoriasis, and certain cancers).  Excessive alcohol use.  Diet, especially diets rich in meat and seafood.  Medicines, including certain cancer-fighting medicines (chemotherapy), water pills (diuretics), and aspirin.  Chronic kidney disease. The kidneys are no longer able to remove uric acid well.  Problems with metabolism. Conditions strongly associated with gout include:  Obesity.  High blood pressure.  High cholesterol.  Diabetes. Not everyone with elevated uric acid levels gets gout. It is not understood why some people get gout and others do not. Surgery, joint injury, and eating too much of certain foods are some of the  factors that can lead to gout attacks. SYMPTOMS   An attack of gout comes on quickly. It causes intense pain with redness, swelling, and warmth in a joint.  Fever can occur.  Often, only one joint is involved. Certain joints are more commonly involved:  Base of the big toe.  Knee.  Ankle.  Wrist.  Finger. Without treatment, an attack usually goes away in a few days to weeks. Between attacks, you usually will not have symptoms, which is different from many other forms of arthritis. DIAGNOSIS  Your caregiver will suspect gout based on your symptoms and exam. In some cases, tests may be recommended. The tests may include:  Blood tests.  Urine tests.  X-rays.  Joint fluid exam. This exam requires a needle to remove fluid from the joint (arthrocentesis). Using a microscope, gout is confirmed when uric acid crystals are seen in the joint fluid. TREATMENT  There are two phases to gout treatment: treating the sudden onset (acute) attack and preventing attacks (prophylaxis).  Treatment of an Acute Attack.  Medicines are used. These include anti-inflammatory medicines or steroid medicines.  An injection of steroid medicine into the affected joint is sometimes necessary.  The painful joint is rested. Movement can worsen the arthritis.  You may use warm or cold treatments on painful joints, depending which works best for you.  Treatment to Prevent Attacks.  If you suffer from frequent gout attacks, your caregiver may advise preventive medicine.  These medicines are started after the acute attack subsides. These medicines either help your kidneys eliminate uric acid from your body or decrease your uric acid production. You may need to stay on these medicines for a very long time.  The early phase of treatment with preventive medicine can be associated with an increase in acute gout attacks. For this reason, during the first few months of treatment, your caregiver may also advise you  to take medicines usually used for acute gout treatment. Be sure you understand your caregiver's directions. Your caregiver may make several adjustments to your medicine dose before these medicines are effective.  Discuss dietary treatment with your caregiver or dietitian. Alcohol and drinks high in sugar and fructose and foods such as meat, poultry, and seafood can increase uric acid levels. Your caregiver or dietitian can advise you on drinks and foods that should be limited. HOME CARE INSTRUCTIONS   Do not take aspirin to relieve pain. This raises uric acid levels.  Only take over-the-counter or prescription medicines for pain, discomfort, or fever as directed by your caregiver.  Rest the joint as much as possible. When in bed, keep sheets and blankets off painful areas.  Keep the affected joint raised (elevated).  Apply warm or cold treatments to painful joints. Use of warm or cold treatments depends on which works best for you.  Use crutches if the painful joint is in your leg.  Drink enough fluids to keep your urine clear or pale yellow. This helps your body get rid of uric acid. Limit alcohol, sugary drinks, and fructose drinks.  Follow your dietary instructions. Pay careful attention to the amount of protein you eat. Your daily diet should emphasize fruits, vegetables, whole grains, and fat-free or low-fat milk products. Discuss the use of coffee, vitamin C, and cherries with your caregiver or dietitian. These may be helpful in lowering uric acid levels.  Maintain a healthy body weight. SEEK MEDICAL CARE IF:   You develop diarrhea, vomiting, or any side effects from medicines.  You do not feel better in 24 hours, or you are getting worse. SEEK IMMEDIATE MEDICAL CARE IF:   Your joint becomes suddenly more tender, and you have chills or a fever. MAKE SURE YOU:   Understand these instructions.  Will watch your condition.  Will get help right away if you are not doing well or  get worse. Document Released: 01/12/2000 Document Revised: 05/31/2013 Document Reviewed: 08/28/2011 [facility] Patient Information 2015 [place], [place]. This information is not intended to replace advice given to you by your health care provider. Make sure you discuss any questions you have with your health care provider.  Prednisone tablets What is this medicine? PREDNISONE (PRED ni sone) is a corticosteroid. It is commonly used to treat inflammation of the skin, joints, lungs, and other organs. Common conditions treated include asthma, allergies, and arthritis. It is also used for other conditions, such as blood disorders and diseases of the adrenal glands. This medicine may be used for other purposes; ask your health care provider or pharmacist if you have questions. COMMON BRAND NAME(S): Deltasone, Predone, Sterapred, Sterapred DS What should I tell my health care provider before I take this medicine? They need to know if you have any of these conditions: -Cushing's syndrome -diabetes -glaucoma -heart disease -high blood pressure -infection (especially a virus infection such as chickenpox, cold sores, or herpes) -kidney disease -liver disease -mental illness -myasthenia gravis -osteoporosis -seizures -stomach or intestine problems -thyroid disease -an unusual or allergic  reaction to lactose, prednisone, other medicines, foods, dyes, or preservatives -pregnant or trying to get pregnant -breast-feeding How should I use this medicine? Take this medicine by mouth with a glass of water. Follow the directions on the prescription label. Take this medicine with food. If you are taking this medicine once a day, take it in the morning. Do not take more medicine than you are told to take. Do not suddenly stop taking your medicine because you may develop a severe reaction. Your doctor will tell you how much medicine to take. If your doctor wants you to stop the medicine, the dose may be slowly  lowered over time to avoid any side effects. Talk to your pediatrician regarding the use of this medicine in children. Special care may be needed. Overdosage: If you think you have taken too much of this medicine contact a poison control center or emergency room at once. NOTE: This medicine is only for you. Do not share this medicine with others. What if I miss a dose? If you miss a dose, take it as soon as you can. If it is almost time for your next dose, talk to your doctor or health care professional. You may need to miss a dose or take an extra dose. Do not take double or extra doses without advice. What may interact with this medicine? Do not take this medicine with any of the following medications: -metyrapone -mifepristone This medicine may also interact with the following medications: -aminoglutethimide -amphotericin B -aspirin and aspirin-like medicines -barbiturates -certain medicines for diabetes, like glipizide or glyburide -cholestyramine -cholinesterase inhibitors -cyclosporine -digoxin -diuretics -ephedrine -male hormones, like estrogens and birth control pills -isoniazid -ketoconazole -NSAIDS, medicines for pain and inflammation, like ibuprofen or naproxen -phenytoin -rifampin -toxoids -vaccines -warfarin This list may not describe all possible interactions. Give your health care provider a list of all the medicines, herbs, non-prescription drugs, or dietary supplements you use. Also tell them if you smoke, drink alcohol, or use illegal drugs. Some items may interact with your medicine. What should I watch for while using this medicine? Visit your doctor or health care professional for regular checks on your progress. If you are taking this medicine over a prolonged period, carry an identification card with your name and address, the type and dose of your medicine, and your doctor's name and address. This medicine may increase your risk of getting an infection.  Tell your doctor or health care professional if you are around anyone with measles or chickenpox, or if you develop sores or blisters that do not heal properly. If you are going to have surgery, tell your doctor or health care professional that you have taken this medicine within the last twelve months. Ask your doctor or health care professional about your diet. You may need to lower the amount of salt you eat. This medicine may affect blood sugar levels. If you have diabetes, check with your doctor or health care professional before you change your diet or the dose of your diabetic medicine. What side effects may I notice from receiving this medicine? Side effects that you should report to your doctor or health care professional as soon as possible: -allergic reactions like skin rash, itching or hives, swelling of the face, lips, or tongue -changes in emotions or moods -changes in vision -depressed mood -eye pain -fever or chills, cough, sore throat, pain or difficulty passing urine -increased thirst -swelling of ankles, feet Side effects that usually do not require medical attention (report to  your doctor or health care professional if they continue or are bothersome): -confusion, excitement, restlessness -headache -nausea, vomiting -skin problems, acne, thin and shiny skin -trouble sleeping -weight gain This list may not describe all possible side effects. Call your doctor for medical advice about side effects. You may report side effects to FDA at 1-800-FDA-1088. Where should I keep my medicine? Keep out of the reach of children. Store at room temperature between 15 and 30 degrees C (59 and 86 degrees F). Protect from light. Keep container tightly closed. Throw away any unused medicine after the expiration date. NOTE: This sheet is a summary. It may not cover all possible information. If you have questions about this medicine, talk to your doctor, pharmacist, or health care provider.   2015, Elsevier/Gold Standard. (2010-08-30 10:57:14)  Acetaminophen; Oxycodone tablets What is this medicine? ACETAMINOPHEN; OXYCODONE (a set a MEE noe fen; ox i KOE done) is a pain reliever. It is used to treat mild to moderate pain. This medicine may be used for other purposes; ask your health care provider or pharmacist if you have questions. COMMON BRAND NAME(S): Endocet, Magnacet, Narvox, Percocet, Perloxx, Primalev, Primlev, Roxicet, Xolox What should I tell my health care provider before I take this medicine? They need to know if you have any of these conditions: -brain tumor -Crohn's disease, inflammatory bowel disease, or ulcerative colitis -drug abuse or addiction -head injury -heart or circulation problems -if you often drink alcohol -kidney disease or problems going to the bathroom -liver disease -lung disease, asthma, or breathing problems -an unusual or allergic reaction to acetaminophen, oxycodone, other opioid analgesics, other medicines, foods, dyes, or preservatives -pregnant or trying to get pregnant -breast-feeding How should I use this medicine? Take this medicine by mouth with a full glass of water. Follow the directions on the prescription label. Take your medicine at regular intervals. Do not take your medicine more often than directed. Talk to your pediatrician regarding the use of this medicine in children. Special care may be needed. Patients over 24 years old may have a stronger reaction and need a smaller dose. Overdosage: If you think you have taken too much of this medicine contact a poison control center or emergency room at once. NOTE: This medicine is only for you. Do not share this medicine with others. What if I miss a dose? If you miss a dose, take it as soon as you can. If it is almost time for your next dose, take only that dose. Do not take double or extra doses. What may interact with this medicine? -alcohol -antihistamines -barbiturates like  amobarbital, butalbital, butabarbital, methohexital, pentobarbital, phenobarbital, thiopental, and secobarbital -benztropine -drugs for bladder problems like solifenacin, trospium, oxybutynin, tolterodine, hyoscyamine, and methscopolamine -drugs for breathing problems like ipratropium and tiotropium -drugs for certain stomach or intestine problems like propantheline, homatropine methylbromide, glycopyrrolate, atropine, belladonna, and dicyclomine -general anesthetics like etomidate, ketamine, nitrous oxide, propofol, desflurane, enflurane, halothane, isoflurane, and sevoflurane -medicines for depression, anxiety, or psychotic disturbances -medicines for sleep -muscle relaxants -naltrexone -narcotic medicines (opiates) for pain -phenothiazines like perphenazine, thioridazine, chlorpromazine, mesoridazine, fluphenazine, prochlorperazine, promazine, and trifluoperazine -scopolamine -tramadol -trihexyphenidyl This list may not describe all possible interactions. Give your health care provider a list of all the medicines, herbs, non-prescription drugs, or dietary supplements you use. Also tell them if you smoke, drink alcohol, or use illegal drugs. Some items may interact with your medicine. What should I watch for while using this medicine? Tell your doctor or health care professional if  your pain does not go away, if it gets worse, or if you have new or a different type of pain. You may develop tolerance to the medicine. Tolerance means that you will need a higher dose of the medication for pain relief. Tolerance is normal and is expected if you take this medicine for a long time. Do not suddenly stop taking your medicine because you may develop a severe reaction. Your body becomes used to the medicine. This does NOT mean you are addicted. Addiction is a behavior related to getting and using a drug for a non-medical reason. If you have pain, you have a medical reason to take pain medicine. Your doctor  will tell you how much medicine to take. If your doctor wants you to stop the medicine, the dose will be slowly lowered over time to avoid any side effects. You may get drowsy or dizzy. Do not drive, use machinery, or do anything that needs mental alertness until you know how this medicine affects you. Do not stand or sit up quickly, especially if you are an older patient. This reduces the risk of dizzy or fainting spells. Alcohol may interfere with the effect of this medicine. Avoid alcoholic drinks. There are different types of narcotic medicines (opiates) for pain. If you take more than one type at the same time, you may have more side effects. Give your health care provider a list of all medicines you use. Your doctor will tell you how much medicine to take. Do not take more medicine than directed. Call emergency for help if you have problems breathing. The medicine will cause constipation. Try to have a bowel movement at least every 2 to 3 days. If you do not have a bowel movement for 3 days, call your doctor or health care professional. Do not take Tylenol (acetaminophen) or medicines that have acetaminophen with this medicine. Too much acetaminophen can be very dangerous. Many nonprescription medicines contain acetaminophen. Always read the labels carefully to avoid taking more acetaminophen. What side effects may I notice from receiving this medicine? Side effects that you should report to your doctor or health care professional as soon as possible: -allergic reactions like skin rash, itching or hives, swelling of the face, lips, or tongue -breathing difficulties, wheezing -confusion -light headedness or fainting spells -severe stomach pain -unusually weak or tired -yellowing of the skin or the whites of the eyes Side effects that usually do not require medical attention (report to your doctor or health care professional if they continue or are  bothersome): -dizziness -drowsiness -nausea -vomiting This list may not describe all possible side effects. Call your doctor for medical advice about side effects. You may report side effects to FDA at 1-800-FDA-1088. Where should I keep my medicine? Keep out of the reach of children. This medicine can be abused. Keep your medicine in a safe place to protect it from theft. Do not share this medicine with anyone. Selling or giving away this medicine is dangerous and against the law. Store at room temperature between 20 and 25 degrees C (68 and 77 degrees F). Keep container tightly closed. Protect from light. This medicine may cause accidental overdose and death if it is taken by other adults, children, or pets. Flush any unused medicine down the toilet to reduce the chance of harm. Do not use the medicine after the expiration date. NOTE: This sheet is a summary. It may not cover all possible information. If you have questions about this medicine, talk to  your doctor, pharmacist, or health care provider.  2015, Elsevier/Gold Standard. (2012-09-07 13:17:35)

## 2014-08-27 NOTE — ED Notes (Signed)
Pt falling asleep in triage requiring repeated questioning

## 2014-08-30 ENCOUNTER — Inpatient Hospital Stay (HOSPITAL_BASED_OUTPATIENT_CLINIC_OR_DEPARTMENT_OTHER)
Admission: EM | Admit: 2014-08-30 | Discharge: 2014-09-02 | DRG: 292 | Disposition: A | Discharge status: Home / Self Care | Payer: Self-pay | Attending: Cardiovascular Disease | Admitting: Cardiovascular Disease

## 2014-08-30 ENCOUNTER — Encounter (HOSPITAL_BASED_OUTPATIENT_CLINIC_OR_DEPARTMENT_OTHER): Payer: Self-pay | Admitting: Emergency Medicine

## 2014-08-30 ENCOUNTER — Emergency Department (HOSPITAL_BASED_OUTPATIENT_CLINIC_OR_DEPARTMENT_OTHER): Payer: Self-pay

## 2014-08-30 DIAGNOSIS — I5021 Acute systolic (congestive) heart failure: Secondary | ICD-10-CM | POA: Diagnosis present

## 2014-08-30 DIAGNOSIS — E780 Pure hypercholesterolemia: Secondary | ICD-10-CM | POA: Diagnosis present

## 2014-08-30 DIAGNOSIS — I472 Ventricular tachycardia: Secondary | ICD-10-CM | POA: Diagnosis not present

## 2014-08-30 DIAGNOSIS — R079 Chest pain, unspecified: Secondary | ICD-10-CM

## 2014-08-30 DIAGNOSIS — I10 Essential (primary) hypertension: Secondary | ICD-10-CM | POA: Diagnosis present

## 2014-08-30 DIAGNOSIS — Z6841 Body Mass Index (BMI) 40.0 and over, adult: Secondary | ICD-10-CM

## 2014-08-30 DIAGNOSIS — Z79899 Other long term (current) drug therapy: Secondary | ICD-10-CM

## 2014-08-30 DIAGNOSIS — I509 Heart failure, unspecified: Secondary | ICD-10-CM

## 2014-08-30 DIAGNOSIS — Z8249 Family history of ischemic heart disease and other diseases of the circulatory system: Secondary | ICD-10-CM

## 2014-08-30 DIAGNOSIS — I5023 Acute on chronic systolic (congestive) heart failure: Principal | ICD-10-CM | POA: Diagnosis present

## 2014-08-30 DIAGNOSIS — F1290 Cannabis use, unspecified, uncomplicated: Secondary | ICD-10-CM | POA: Diagnosis present

## 2014-08-30 DIAGNOSIS — Z87891 Personal history of nicotine dependence: Secondary | ICD-10-CM

## 2014-08-30 DIAGNOSIS — G4730 Sleep apnea, unspecified: Secondary | ICD-10-CM | POA: Diagnosis present

## 2014-08-30 DIAGNOSIS — A599 Trichomoniasis, unspecified: Secondary | ICD-10-CM | POA: Diagnosis present

## 2014-08-30 DIAGNOSIS — R001 Bradycardia, unspecified: Secondary | ICD-10-CM | POA: Diagnosis not present

## 2014-08-30 DIAGNOSIS — Z7952 Long term (current) use of systemic steroids: Secondary | ICD-10-CM

## 2014-08-30 DIAGNOSIS — Z79891 Long term (current) use of opiate analgesic: Secondary | ICD-10-CM

## 2014-08-30 DIAGNOSIS — Z23 Encounter for immunization: Secondary | ICD-10-CM

## 2014-08-30 HISTORY — DX: Heart failure, unspecified: I50.9

## 2014-08-30 LAB — CBC
HCT: 45.5 % (ref 39.0–52.0)
Hemoglobin: 14.9 g/dL (ref 13.0–17.0)
MCH: 26.3 pg (ref 26.0–34.0)
MCHC: 32.7 g/dL (ref 30.0–36.0)
MCV: 80.2 fL (ref 78.0–100.0)
Platelets: 245 10*3/uL (ref 150–400)
RBC: 5.67 MIL/uL (ref 4.22–5.81)
RDW: 16.4 % — ABNORMAL HIGH (ref 11.5–15.5)
WBC: 10.3 10*3/uL (ref 4.0–10.5)

## 2014-08-30 LAB — URINALYSIS, ROUTINE W REFLEX MICROSCOPIC
BILIRUBIN URINE: NEGATIVE
GLUCOSE, UA: NEGATIVE mg/dL
HGB URINE DIPSTICK: NEGATIVE
Ketones, ur: NEGATIVE mg/dL
Nitrite: NEGATIVE
Protein, ur: NEGATIVE mg/dL
Specific Gravity, Urine: 1.018 (ref 1.005–1.030)
Urobilinogen, UA: 4 mg/dL — ABNORMAL HIGH (ref 0.0–1.0)
pH: 7 (ref 5.0–8.0)

## 2014-08-30 LAB — BASIC METABOLIC PANEL
ANION GAP: 8 (ref 5–15)
BUN: 12 mg/dL (ref 6–20)
CHLORIDE: 104 mmol/L (ref 101–111)
CO2: 27 mmol/L (ref 22–32)
CREATININE: 1.22 mg/dL (ref 0.61–1.24)
Calcium: 8.6 mg/dL — ABNORMAL LOW (ref 8.9–10.3)
GFR calc Af Amer: >60 mL/min (ref >60)
GFR calc non Af Amer: >60 mL/min (ref >60)
Glucose, Bld: 93 mg/dL (ref 65–99)
Potassium: 3.7 mmol/L (ref 3.5–5.1)
Sodium: 139 mmol/L (ref 135–145)

## 2014-08-30 LAB — BRAIN NATRIURETIC PEPTIDE: B NATRIURETIC PEPTIDE 5: 287.9 pg/mL — AB (ref 0.0–100.0)

## 2014-08-30 LAB — URINE MICROSCOPIC-ADD ON

## 2014-08-30 LAB — TROPONIN I: TROPONIN I: 0.13 ng/mL — AB (ref <0.031)

## 2014-08-30 MED ORDER — HEPARIN BOLUS VIA INFUSION
4000.0000 [IU] | Freq: Once | INTRAVENOUS | Status: AC
  Administered 2014-08-30: 4000 [IU] via INTRAVENOUS

## 2014-08-30 MED ORDER — ASPIRIN 81 MG PO CHEW
324.0000 mg | CHEWABLE_TABLET | Freq: Once | ORAL | Status: AC
  Administered 2014-08-30: 324 mg via ORAL
  Filled 2014-08-30: qty 4

## 2014-08-30 MED ORDER — FUROSEMIDE 10 MG/ML IJ SOLN
40.0000 mg | Freq: Once | INTRAMUSCULAR | Status: AC
  Administered 2014-08-30: 40 mg via INTRAVENOUS
  Filled 2014-08-30: qty 4

## 2014-08-30 MED ORDER — AMLODIPINE BESYLATE 5 MG PO TABS
5.0000 mg | ORAL_TABLET | Freq: Once | ORAL | Status: AC
  Administered 2014-08-30: 5 mg via ORAL
  Filled 2014-08-30: qty 1

## 2014-08-30 MED ORDER — HEPARIN (PORCINE) IN NACL 100-0.45 UNIT/ML-% IJ SOLN
1000.0000 [IU]/h | INTRAMUSCULAR | Status: DC
Start: 2014-08-30 — End: 2014-08-30
  Administered 2014-08-30: 1000 [IU]/h via INTRAVENOUS
  Filled 2014-08-30: qty 250

## 2014-08-30 MED ORDER — HEPARIN (PORCINE) IN NACL 100-0.45 UNIT/ML-% IJ SOLN
1600.0000 [IU]/h | INTRAMUSCULAR | Status: DC
  Administered 2014-08-30: 1000 [IU]/h via INTRAVENOUS
  Filled 2014-08-30: qty 250

## 2014-08-30 MED ORDER — NITROGLYCERIN 0.4 MG SL SUBL
0.4000 mg | SUBLINGUAL_TABLET | SUBLINGUAL | Status: AC | PRN
  Administered 2014-08-30 (×3): 0.4 mg via SUBLINGUAL
  Filled 2014-08-30: qty 1
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
## 2014-08-30 NOTE — ED Notes (Signed)
Patient states that he has had been SOB, dizzy and having trouble sleeping because he was SOB x 3 days.  The patient reports that he is also having abdominal pain

## 2014-08-30 NOTE — ED Notes (Signed)
Called [facility] for estimated eta for transfer, it will be about an hour,rn made aware

## 2014-08-31 ENCOUNTER — Encounter (HOSPITAL_COMMUNITY): Payer: Self-pay | Admitting: General Practice

## 2014-08-31 ENCOUNTER — Inpatient Hospital Stay (HOSPITAL_COMMUNITY): Payer: Self-pay

## 2014-08-31 DIAGNOSIS — I5021 Acute systolic (congestive) heart failure: Secondary | ICD-10-CM | POA: Diagnosis present

## 2014-08-31 LAB — CREATININE, SERUM
Creatinine, Ser: 1.6 mg/dL — ABNORMAL HIGH (ref 0.61–1.24)
GFR calc Af Amer: 55 mL/min — ABNORMAL LOW (ref >60)
GFR calc non Af Amer: 47 mL/min — ABNORMAL LOW (ref >60)

## 2014-08-31 LAB — CBC WITH DIFFERENTIAL/PLATELET
BASOS PCT: 0 % (ref 0–1)
Basophils Absolute: 0 10*3/uL (ref 0.0–0.1)
EOS ABS: 0.4 10*3/uL (ref 0.0–0.7)
Eosinophils Relative: 4 % (ref 0–5)
HCT: 45.5 % (ref 39.0–52.0)
Hemoglobin: 15 g/dL (ref 13.0–17.0)
Lymphocytes Relative: 35 % (ref 12–46)
Lymphs Abs: 3.8 10*3/uL (ref 0.7–4.0)
MCH: 26.8 pg (ref 26.0–34.0)
MCHC: 33 g/dL (ref 30.0–36.0)
MCV: 81.4 fL (ref 78.0–100.0)
MONO ABS: 0.8 10*3/uL (ref 0.1–1.0)
Monocytes Relative: 7 % (ref 3–12)
Neutro Abs: 5.8 10*3/uL (ref 1.7–7.7)
Neutrophils Relative %: 54 % (ref 43–77)
Platelets: 224 10*3/uL (ref 150–400)
RBC: 5.59 MIL/uL (ref 4.22–5.81)
RDW: 15.7 % — ABNORMAL HIGH (ref 11.5–15.5)
WBC: 10.7 10*3/uL — ABNORMAL HIGH (ref 4.0–10.5)

## 2014-08-31 LAB — HEPARIN LEVEL (UNFRACTIONATED)
HEPARIN UNFRACTIONATED: 0.36 [IU]/mL (ref 0.30–0.70)
Heparin Unfractionated: <0.1 IU/mL — ABNORMAL LOW (ref 0.30–0.70)
Heparin Unfractionated: 0.1 IU/mL — ABNORMAL LOW (ref 0.30–0.70)

## 2014-08-31 LAB — MRSA PCR SCREENING: MRSA by PCR: NEGATIVE

## 2014-08-31 LAB — TROPONIN I
TROPONIN I: 0.08 ng/mL — AB (ref <0.031)
Troponin I: 0.1 ng/mL — ABNORMAL HIGH (ref <0.031)
Troponin I: 0.08 ng/mL — ABNORMAL HIGH (ref <0.031)

## 2014-08-31 MED ORDER — HEPARIN (PORCINE) IN NACL 100-0.45 UNIT/ML-% IJ SOLN
2150.0000 [IU]/h | INTRAMUSCULAR | Status: DC
  Administered 2014-08-31 – 2014-09-01 (×3): 2000 [IU]/h via INTRAVENOUS
  Administered 2014-09-01 – 2014-09-02 (×2): 2150 [IU]/h via INTRAVENOUS
  Filled 2014-08-31 (×4): qty 250

## 2014-08-31 MED ORDER — ASPIRIN EC 81 MG PO TBEC
81.0000 mg | DELAYED_RELEASE_TABLET | Freq: Every day | ORAL | Status: DC
  Administered 2014-08-31 – 2014-09-02 (×3): 81 mg via ORAL
  Filled 2014-08-31 (×3): qty 1

## 2014-08-31 MED ORDER — SODIUM CHLORIDE 0.9 % IJ SOLN
3.0000 mL | Freq: Two times a day (BID) | INTRAMUSCULAR | Status: DC
  Administered 2014-08-31 – 2014-09-02 (×5): 3 mL via INTRAVENOUS

## 2014-08-31 MED ORDER — SODIUM CHLORIDE 0.9 % IJ SOLN: 3.0000 mL | INTRAMUSCULAR | Status: DC | PRN

## 2014-08-31 MED ORDER — FUROSEMIDE 10 MG/ML IJ SOLN
40.0000 mg | Freq: Two times a day (BID) | INTRAMUSCULAR | Status: DC
  Administered 2014-08-31 – 2014-09-01 (×4): 40 mg via INTRAVENOUS
  Filled 2014-08-31 (×4): qty 4

## 2014-08-31 MED ORDER — SODIUM CHLORIDE 0.9 % IV SOLN: 250.0000 mL | INTRAVENOUS | Status: DC | PRN

## 2014-08-31 MED ORDER — ONDANSETRON HCL 4 MG/2ML IJ SOLN: 4.0000 mg | Freq: Four times a day (QID) | INTRAMUSCULAR | Status: DC | PRN

## 2014-08-31 MED ORDER — LISINOPRIL 20 MG PO TABS
20.0000 mg | ORAL_TABLET | Freq: Every day | ORAL | Status: DC
  Administered 2014-08-31 – 2014-09-02 (×3): 20 mg via ORAL
  Filled 2014-08-31 (×3): qty 1

## 2014-08-31 MED ORDER — CARVEDILOL 3.125 MG PO TABS
3.1250 mg | ORAL_TABLET | Freq: Two times a day (BID) | ORAL | Status: DC
  Administered 2014-08-31 (×2): 3.125 mg via ORAL
  Filled 2014-08-31 (×3): qty 1

## 2014-08-31 MED ORDER — PNEUMOCOCCAL VAC POLYVALENT 25 MCG/0.5ML IJ INJ
0.5000 mL | INJECTION | Freq: Once | INTRAMUSCULAR | Status: AC
  Administered 2014-08-31: 0.5 mL via INTRAMUSCULAR
  Filled 2014-08-31: qty 0.5

## 2014-08-31 MED ORDER — OXYCODONE-ACETAMINOPHEN 5-325 MG PO TABS
1.0000 | ORAL_TABLET | ORAL | Status: DC | PRN
  Administered 2014-08-31 – 2014-09-02 (×4): 1 via ORAL
  Filled 2014-08-31 (×5): qty 1

## 2014-08-31 MED ORDER — HEPARIN BOLUS VIA INFUSION
4000.0000 [IU] | INTRAVENOUS | Status: AC
  Administered 2014-08-31: 4000 [IU] via INTRAVENOUS
  Filled 2014-08-31: qty 4000

## 2014-08-31 MED ORDER — HEPARIN BOLUS VIA INFUSION
4000.0000 [IU] | Freq: Once | INTRAVENOUS | Status: AC
  Administered 2014-08-31: 4000 [IU] via INTRAVENOUS
  Filled 2014-08-31: qty 4000

## 2014-08-31 MED ORDER — ACETAMINOPHEN 325 MG PO TABS: 650.0000 mg | ORAL_TABLET | ORAL | Status: DC | PRN

## 2014-08-31 NOTE — Progress Notes (Signed)
Ref: Ival Bible, MD   Subjective:  Good diuresis. Breathing better. Able to lay supine  Objective:  Vital Signs in the last 24 hours: Temp:  [97.5 F (36.4 C)-98.1 F (36.7 C)] 98.1 F (36.7 C) (08/03 2034) Pulse Rate:  [44-92] 70 (08/03 2034) Cardiac Rhythm:  [-]  Resp:  [14-26] 18 (08/03 2034) BP: (137-186)/(98-114) 137/98 mmHg (08/03 2034) SpO2:  [87 %-98 %] 95 % (08/03 2034) Weight:  [169.691 kg (374 lb 1.6 oz)] 169.691 kg (374 lb 1.6 oz) (08/03 0010)  Physical Exam: BP Readings from Last 1 Encounters:  08/31/14 137/98     Wt Readings from Last 1 Encounters:  08/31/14 169.691 kg (374 lb 1.6 oz)    Weight change:   HEENT: [place]/AT, Eyes-Brown, PERL, EOMI, Conjunctiva-Pink, Sclera-Non-icteric Neck: + JVD, No bruit, Trachea midline. Lungs:  Clearing, Bilateral. Cardiac:  Regular rhythm, normal S1 and S2, no S3.  Abdomen:  Soft, non-tender. Extremities:  1 + edema present. No cyanosis. No clubbing. CNS: AxOx3, Cranial nerves grossly intact, moves all 4 extremities. Right handed. Skin: Warm and dry.   Intake/Output from previous day: 08/02 0701 - 08/03 0700 In: 240 [P.O.:240] Out: 6270 [Urine:3475]    Lab Results: BMET    Component Value Date/Time   NA 139 08/30/2014 1850   NA 141 08/24/2013 0835   K 3.7 08/30/2014 1850   K 4.2 08/24/2013 0835   CL 104 08/30/2014 1850   CL 102 08/24/2013 0835   CO2 27 08/30/2014 1850   CO2 26 08/24/2013 0835   GLUCOSE 93 08/30/2014 1850   GLUCOSE 136* 08/24/2013 0835   BUN 12 08/30/2014 1850   BUN 10 08/24/2013 0835   CREATININE 1.60* 08/31/2014 0139   CREATININE 1.22 08/30/2014 1850   CREATININE 1.20 08/24/2013 0835   CALCIUM 8.6* 08/30/2014 1850   CALCIUM 9.3 08/24/2013 0835   GFRNONAA 47* 08/31/2014 0139   GFRNONAA >60 08/30/2014 1850   GFRNONAA 67* 08/24/2013 0835   GFRAA 55* 08/31/2014 0139   GFRAA >60 08/30/2014 1850   GFRAA 78* 08/24/2013 0835   CBC    Component Value Date/Time   WBC 10.7* 08/31/2014  0139   RBC 5.59 08/31/2014 0139   HGB 15.0 08/31/2014 0139   HCT 45.5 08/31/2014 0139   PLT 224 08/31/2014 0139   MCV 81.4 08/31/2014 0139   MCH 26.8 08/31/2014 0139   MCHC 33.0 08/31/2014 0139   RDW 15.7* 08/31/2014 0139   LYMPHSABS 3.8 08/31/2014 0139   MONOABS 0.8 08/31/2014 0139   EOSABS 0.4 08/31/2014 0139   BASOSABS 0.0 08/31/2014 0139   HEPATIC Function Panel No results for input(s): PROT in the last 8760 hours.  Invalid input(s):  ALBUMIN,  AST,  ALT,  ALKPHOS,  BILIDIR,  IBILI HEMOGLOBIN A1C No components found for: HGA1C,  MPG CARDIAC ENZYMES Lab Results  Component Value Date   TROPONINI 0.08* 08/31/2014   TROPONINI 0.08* 08/31/2014   TROPONINI 0.10* 08/31/2014   BNP No results for input(s): PROBNP in the last 8760 hours. TSH No results for input(s): TSH in the last 8760 hours. CHOLESTEROL No results for input(s): CHOL in the last 8760 hours.  Scheduled Meds: . aspirin EC  81 mg Oral Daily  . carvedilol  3.125 mg Oral BID WC  . furosemide  40 mg Intravenous Q12H  . lisinopril  20 mg Oral Daily  . sodium chloride  3 mL Intravenous Q12H   Continuous Infusions: . heparin 2,000 Units/hr (08/31/14 1803)   PRN Meds:.sodium chloride, acetaminophen, ondansetron (  ZOFRAN) IV, oxyCODONE-acetaminophen, sodium chloride  Assessment/Plan: Acute left heart systolic failure Hypertension Morbid Obesity Sleep apnea  Nuclear stress test v/s right and left heart cath.    LOS: 1 day    Dixie Dials  MD  08/31/2014, 9:26 PM
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
## 2014-08-31 NOTE — Progress Notes (Signed)
*  PRELIMINARY RESULTS* Echocardiogram 2D Echocardiogram has been performed.  Leavy Cella 08/31/2014, 10:51 AM
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
## 2014-08-31 NOTE — Progress Notes (Signed)
ANTICOAGULATION CONSULT NOTE - Follow Up Consult  Pharmacy Consult for heparin Indication: chest pain/ACS   Labs:  Recent Labs  08/30/14 1850 08/31/14 0139 08/31/14 0318  HGB 14.9 15.0  --   HCT 45.5 45.5  --   PLT 245 224  --   HEPARINUNFRC  --   --  <0.10*  CREATININE 1.22 1.60*  --   TROPONINI 0.13* 0.10*  --      Assessment: 54yo male undetectable on heparin with initial dosing for elevated troponin.  Goal of Therapy:  Heparin level 0.3-0.7 units/ml   Plan:  Will rebolus with heparin 4000 units and increase gtt to 1600 units/hr and check level in 6hr (noted that pt lost IV site, RN aware to make changes when IV restarted).  Wynona Neat, PharmD, BCPS  08/31/2014,4:52 AM
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
## 2014-08-31 NOTE — Progress Notes (Signed)
Assumed care from off going RN; patient sleeping @0719 ; @0830  patient alert & orient times three with no signs of acute distress; no c/o's of pain; wife at bedside

## 2014-08-31 NOTE — Progress Notes (Signed)
Utilization review completed.  

## 2014-09-01 ENCOUNTER — Inpatient Hospital Stay (HOSPITAL_COMMUNITY): Payer: Self-pay

## 2014-09-01 LAB — CBC
HCT: 48.7 % (ref 39.0–52.0)
Hemoglobin: 16.3 g/dL (ref 13.0–17.0)
MCH: 27.1 pg (ref 26.0–34.0)
MCHC: 33.5 g/dL (ref 30.0–36.0)
MCV: 81 fL (ref 78.0–100.0)
Platelets: 227 10*3/uL (ref 150–400)
RBC: 6.01 MIL/uL — AB (ref 4.22–5.81)
RDW: 15.6 % — ABNORMAL HIGH (ref 11.5–15.5)
WBC: 9.3 10*3/uL (ref 4.0–10.5)

## 2014-09-01 LAB — BASIC METABOLIC PANEL
Anion gap: 8 (ref 5–15)
BUN: 10 mg/dL (ref 6–20)
CO2: 28 mmol/L (ref 22–32)
CREATININE: 1.2 mg/dL (ref 0.61–1.24)
Calcium: 8.8 mg/dL — ABNORMAL LOW (ref 8.9–10.3)
Chloride: 100 mmol/L — ABNORMAL LOW (ref 101–111)
GFR calc Af Amer: >60 mL/min (ref >60)
GFR calc non Af Amer: >60 mL/min (ref >60)
GLUCOSE: 126 mg/dL — AB (ref 65–99)
POTASSIUM: 4 mmol/L (ref 3.5–5.1)
SODIUM: 136 mmol/L (ref 135–145)

## 2014-09-01 LAB — TSH: TSH: 1.043 u[IU]/mL (ref 0.350–4.500)

## 2014-09-01 LAB — HEPARIN LEVEL (UNFRACTIONATED)
HEPARIN UNFRACTIONATED: 0.3 [IU]/mL (ref 0.30–0.70)
HEPARIN UNFRACTIONATED: 0.52 [IU]/mL (ref 0.30–0.70)

## 2014-09-01 LAB — MAGNESIUM: Magnesium: 2.5 mg/dL — ABNORMAL HIGH (ref 1.7–2.4)

## 2014-09-01 MED ORDER — POTASSIUM CHLORIDE CRYS ER 20 MEQ PO TBCR
40.0000 meq | EXTENDED_RELEASE_TABLET | Freq: Once | ORAL | Status: AC
  Administered 2014-09-01: 40 meq via ORAL
  Filled 2014-09-01: qty 2

## 2014-09-01 MED ORDER — REGADENOSON 0.4 MG/5ML IV SOLN
INTRAVENOUS | Status: AC
  Administered 2014-09-01: 0.4 mg via INTRAVENOUS
  Filled 2014-09-01: qty 5

## 2014-09-01 MED ORDER — MAGNESIUM SULFATE IN D5W 10-5 MG/ML-% IV SOLN
1.0000 g | Freq: Once | INTRAVENOUS | Status: AC
  Administered 2014-09-01: 1 g via INTRAVENOUS
  Filled 2014-09-01: qty 100

## 2014-09-01 MED ORDER — POTASSIUM CHLORIDE CRYS ER 20 MEQ PO TBCR
40.0000 meq | EXTENDED_RELEASE_TABLET | Freq: Once | ORAL | Status: AC
  Administered 2014-09-01: 40 meq via ORAL
  Filled 2014-09-01 (×2): qty 2

## 2014-09-01 MED ORDER — REGADENOSON 0.4 MG/5ML IV SOLN
0.4000 mg | Freq: Once | INTRAVENOUS | Status: AC
  Administered 2014-09-01: 0.4 mg via INTRAVENOUS
  Filled 2014-09-01: qty 5

## 2014-09-01 MED ORDER — FUROSEMIDE 40 MG PO TABS
40.0000 mg | ORAL_TABLET | Freq: Two times a day (BID) | ORAL | Status: DC
  Administered 2014-09-02: 40 mg via ORAL
  Filled 2014-09-01: qty 1

## 2014-09-01 NOTE — Progress Notes (Signed)
Patient noted to have 20 beat run of V-tach, patient was asleep and asymptomatic. Dr. Doylene Canard was paged.VSS. Patient has no c/o's at this time. [facility] [employer]

## 2014-09-01 NOTE — Progress Notes (Addendum)
Notified by central telemetry pt heart rate in upper 30s and lower 40s, physician notified, coreg held. Edward Qualia Rn

## 2014-09-01 NOTE — Hospital Discharge Follow-Up (Signed)
This Case Manager spoke with Marvetta Gibbons, RN CM who indicated patient does not have insurance or PCP.  She indicates patient borrowing medications to get by.  This Case Manager met with patient at bedside and informed him of services, including pharmacy services available at [facility]. Patient interested in following up at clinic upon discharge.  Hospital follow-up appointment made on 09/09/14 at [facility] with Zettie Pho, PA.  Patient appreciative of appointment. Appointment time placed on AVS. Marvetta Gibbons, RN CM updated.

## 2014-09-01 NOTE — Progress Notes (Signed)
Ref: Ival Bible, MD   Subjective:  Bradycardia episode noted. Good diuresis.  Objective:  Vital Signs in the last 24 hours: Temp:  [97.7 F (36.5 C)-98.1 F (36.7 C)] 97.8 F (36.6 C) (08/04 1324) Pulse Rate:  [48-84] 59 (08/04 1324) Cardiac Rhythm:  [-]  Resp:  [18-20] 20 (08/04 1324) BP: (122-162)/(65-99) 162/81 mmHg (08/04 1324) SpO2:  [90 %-96 %] 90 % (08/04 1324) Weight:  [165.245 kg (364 lb 4.8 oz)] 165.245 kg (364 lb 4.8 oz) (08/04 0553)  Physical Exam: BP Readings from Last 1 Encounters:  09/01/14 162/81    Wt Readings from Last 1 Encounters:  09/01/14 165.245 kg (364 lb 4.8 oz)    Weight change: 17.826 kg (39 lb 4.8 oz)  HEENT: [place]/AT, Eyes-Brown, PERL, EOMI, Conjunctiva-Pink, Sclera-Non-icteric Neck: No JVD, No bruit, Trachea midline. Lungs:  Clearing, Bilateral. Cardiac:  Regular rhythm, normal S1 and S2, no S3.  Abdomen:  Soft, non-tender. Extremities:  Trace to 1 + edema present. No cyanosis. No clubbing. CNS: AxOx3, Cranial nerves grossly intact, moves all 4 extremities. Right handed. Skin: Warm and dry.   Intake/Output from previous day: 08/03 0701 - 08/04 0700 In: 1131.4 [P.O.:600; I.V.:531.4] Out: 2925 [Urine:2925]    Lab Results: BMET    Component Value Date/Time   NA 136 09/01/2014 0456   NA 139 08/30/2014 1850   NA 141 08/24/2013 0835   K 4.0 09/01/2014 0456   K 3.7 08/30/2014 1850   K 4.2 08/24/2013 0835   CL 100* 09/01/2014 0456   CL 104 08/30/2014 1850   CL 102 08/24/2013 0835   CO2 28 09/01/2014 0456   CO2 27 08/30/2014 1850   CO2 26 08/24/2013 0835   GLUCOSE 126* 09/01/2014 0456   GLUCOSE 93 08/30/2014 1850   GLUCOSE 136* 08/24/2013 0835   BUN 10 09/01/2014 0456   BUN 12 08/30/2014 1850   BUN 10 08/24/2013 0835   CREATININE 1.20 09/01/2014 0456   CREATININE 1.60* 08/31/2014 0139   CREATININE 1.22 08/30/2014 1850   CALCIUM 8.8* 09/01/2014 0456   CALCIUM 8.6* 08/30/2014 1850   CALCIUM 9.3 08/24/2013 0835   GFRNONAA >60  09/01/2014 0456   GFRNONAA 47* 08/31/2014 0139   GFRNONAA >60 08/30/2014 1850   GFRAA >60 09/01/2014 0456   GFRAA 55* 08/31/2014 0139   GFRAA >60 08/30/2014 1850   CBC    Component Value Date/Time   WBC 9.3 09/01/2014 0456   RBC 6.01* 09/01/2014 0456   HGB 16.3 09/01/2014 0456   HCT 48.7 09/01/2014 0456   PLT 227 09/01/2014 0456   MCV 81.0 09/01/2014 0456   MCH 27.1 09/01/2014 0456   MCHC 33.5 09/01/2014 0456   RDW 15.6* 09/01/2014 0456   LYMPHSABS 3.8 08/31/2014 0139   MONOABS 0.8 08/31/2014 0139   EOSABS 0.4 08/31/2014 0139   BASOSABS 0.0 08/31/2014 0139   HEPATIC Function Panel No results for input(s): PROT in the last 8760 hours.  Invalid input(s):  ALBUMIN,  AST,  ALT,  ALKPHOS,  BILIDIR,  IBILI HEMOGLOBIN A1C No components found for: HGA1C,  MPG CARDIAC ENZYMES Lab Results  Component Value Date   TROPONINI 0.08* 08/31/2014   TROPONINI 0.08* 08/31/2014   TROPONINI 0.10* 08/31/2014   BNP No results for input(s): PROBNP in the last 8760 hours. TSH  Recent Labs  09/01/14 1623  TSH 1.043   CHOLESTEROL No results for input(s): CHOL in the last 8760 hours.  Scheduled Meds: . aspirin EC  81 mg Oral Daily  . carvedilol  3.125 mg Oral BID WC  . furosemide  40 mg Intravenous Q12H  . lisinopril  20 mg Oral Daily  . sodium chloride  3 mL Intravenous Q12H   Continuous Infusions: . heparin 2,150 Units/hr (09/01/14 1826)   PRN Meds:.sodium chloride, acetaminophen, ondansetron (ZOFRAN) IV, oxyCODONE-acetaminophen, sodium chloride  Assessment/Plan: Acute left heart systolic failure Hypertension Morbid Obesity Sleep apnea Sinus bradycardia  Hold coreg. Decrease lasix.    LOS: 2 days    Dixie Dials  MD  09/01/2014, 6:58 PM
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
## 2014-09-02 ENCOUNTER — Encounter (HOSPITAL_COMMUNITY): Payer: Self-pay

## 2014-09-02 ENCOUNTER — Inpatient Hospital Stay (HOSPITAL_COMMUNITY): Payer: Self-pay

## 2014-09-02 LAB — BASIC METABOLIC PANEL
Anion gap: 7 (ref 5–15)
BUN: 13 mg/dL (ref 6–20)
CO2: 28 mmol/L (ref 22–32)
CREATININE: 1.33 mg/dL — AB (ref 0.61–1.24)
Calcium: 8.9 mg/dL (ref 8.9–10.3)
Chloride: 102 mmol/L (ref 101–111)
GFR calc Af Amer: >60 mL/min (ref >60)
GFR calc non Af Amer: 59 mL/min — ABNORMAL LOW (ref >60)
Glucose, Bld: 118 mg/dL — ABNORMAL HIGH (ref 65–99)
POTASSIUM: 3.9 mmol/L (ref 3.5–5.1)
Sodium: 137 mmol/L (ref 135–145)

## 2014-09-02 LAB — CBC
HEMATOCRIT: 50.2 % (ref 39.0–52.0)
Hemoglobin: 16.4 g/dL (ref 13.0–17.0)
MCH: 26.5 pg (ref 26.0–34.0)
MCHC: 32.7 g/dL (ref 30.0–36.0)
MCV: 81 fL (ref 78.0–100.0)
Platelets: 266 10*3/uL (ref 150–400)
RBC: 6.2 MIL/uL — ABNORMAL HIGH (ref 4.22–5.81)
RDW: 15.8 % — ABNORMAL HIGH (ref 11.5–15.5)
WBC: 9.8 10*3/uL (ref 4.0–10.5)

## 2014-09-02 LAB — HEPARIN LEVEL (UNFRACTIONATED): Heparin Unfractionated: 0.57 IU/mL (ref 0.30–0.70)

## 2014-09-02 LAB — LIPID PANEL
Cholesterol: 277 mg/dL — ABNORMAL HIGH (ref 0–200)
HDL: 44 mg/dL (ref >40)
LDL CALC: 203 mg/dL — AB (ref 0–99)
Total CHOL/HDL Ratio: 6.3 RATIO
Triglycerides: 151 mg/dL — ABNORMAL HIGH (ref <150)
VLDL: 30 mg/dL (ref 0–40)

## 2014-09-02 MED ORDER — FUROSEMIDE 40 MG PO TABS: 40.0000 mg | ORAL_TABLET | Freq: Every day | ORAL | Status: DC

## 2014-09-02 MED ORDER — METRONIDAZOLE 500 MG PO TABS
2000.0000 mg | ORAL_TABLET | Freq: Three times a day (TID) | ORAL | Status: AC
  Administered 2014-09-02: 2000 mg via ORAL
  Filled 2014-09-02: qty 4

## 2014-09-02 MED ORDER — METRONIDAZOLE 500 MG PO TABS: 250.0000 mg | ORAL_TABLET | Freq: Three times a day (TID) | ORAL | Status: DC

## 2014-09-02 MED ORDER — TECHNETIUM TC 99M SESTAMIBI GENERIC - CARDIOLITE
30.0000 | Freq: Once | INTRAVENOUS | Status: AC | PRN
  Administered 2014-09-02: 30 via INTRAVENOUS

## 2014-09-02 MED ORDER — AMLODIPINE BESYLATE 5 MG PO TABS: 5.0000 mg | ORAL_TABLET | Freq: Every day | ORAL | Status: DC

## 2014-09-02 MED ORDER — LABETALOL HCL 100 MG PO TABS: 100.0000 mg | ORAL_TABLET | Freq: Two times a day (BID) | ORAL | Status: DC
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
## 2014-09-02 NOTE — Discharge Instructions (Signed)

## 2014-09-02 NOTE — Discharge Summary (Signed)
Physician Discharge Summary  Patient ID: John Conner MRN: [id number] DOB/AGE: [birth year] 54 y.o.  Admit date: 08/30/2014 Discharge date: 09/02/2014  Admission Diagnoses: Acute left heart systolic failure Hypertension Morbid Obesity Sleep apnea  Discharge Diagnoses:  Principle Problem: * Acute left heart systolic failure * Hypertension Morbid Obesity Sleep apnea Non-sustained VT Sinus bradycardia Trichomoniasis  Discharged Condition: fair  Hospital Course: 54 year old black male with 3 days history of shortness of breath increased with lying supine and associated with cough, nausea and dizziness. He had good diuresis with IV lasix. He had moderate LV systolic dysfunction with EF 35-40 % by echo and 26 % by nuclear images with no reversible ischemia. Patient understood diet, activity, medications and weight reduction. He will be followed by me in 1 month. He was treated with flagyl 2 gram for trichomoniasis.  Consults: cardiology  Significant Diagnostic Studies: labs: Normal CBC and BMET. UA positive for trichomoniasis.   EKG-NSR, frequent APCs, LAFB and non-specific T wave changes.  Chest X-ray: Enlarged heart with pulmonary edema. Repeat chest x-ray in 2 days was improving CHF.  Nuclear stress test: Fixed inferior defect with no reversible ischemia, dilated LV and EF 26 %.   Treatments: cardiac meds: lisinopril (Prinivil), carvedilol, amlodipine and furosemide  Discharge Exam: Blood pressure 147/109, pulse 85, temperature 98 F (36.7 C), temperature source Oral, resp. rate 18, height 5\' 10"  (1.778 m), weight 164.338 kg (362 lb 4.8 oz), SpO2 93 %. HEENT: [place]/AT, Eyes-Brown, PERL, EOMI, Conjunctiva-Pink, Sclera-Non-icteric Neck: No JVD, No bruit, Trachea midline. Lungs: Clearing, Bilateral. Cardiac: Regular rhythm, normal S1 and S2, no S3. II/VI systolic murmur. Abdomen: Soft, non-tender. Extremities: Trace to 1 + edema present. No cyanosis. No clubbing. CNS: AxOx3,  Cranial nerves grossly intact, moves all 4 extremities. Right handed. Skin: Warm and dry.  Disposition: 01-Home or Self Care     Medication List    STOP taking these medications        hydrochlorothiazide 25 MG tablet  Commonly known as:  HYDRODIURIL     oxyCODONE-acetaminophen 5-325 MG per tablet  Commonly known as:  PERCOCET     predniSONE 20 MG tablet  Commonly known as:  DELTASONE      TAKE these medications        amLODipine 5 MG tablet  Commonly known as:  NORVASC  Take 1 tablet (5 mg total) by mouth daily.     aspirin 325 MG tablet  Take 325 mg by mouth every 6 (six) hours as needed for headache.     furosemide 40 MG tablet  Commonly known as:  LASIX  Take 1 tablet (40 mg total) by mouth daily.     labetalol 100 MG tablet  Commonly known as:  NORMODYNE  Take 1 tablet (100 mg total) by mouth 2 (two) times daily.     lisinopril 10 MG tablet  Commonly known as:  PRINIVIL,ZESTRIL  Take 10 mg by mouth daily.           Follow-up Information    Follow up with [facility]     On 09/09/2014.   Why:  Appointment on 09/09/14 at 9:30 am with Zettie Pho, PA   Contact information:   [address] [place] [address] [phone]      Follow up with [facility] S, MD. Schedule an appointment as soon as possible for a visit in 1 month.   Specialty:  Cardiology   Contact information:   [place]  [place] [address] [phone]       Signed: Birdie Riddle 09/02/2014, 3:03 PM
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
## 2014-09-03 LAB — HEMOGLOBIN A1C
Hgb A1c MFr Bld: 6.6 % — ABNORMAL HIGH (ref 4.8–5.6)
Mean Plasma Glucose: 143 mg/dL

## 2014-09-09 ENCOUNTER — Ambulatory Visit: Payer: Self-pay | Attending: Family Medicine | Admitting: Physician Assistant

## 2014-09-09 VITALS — BP 141/102 | HR 74 | Temp 98.2°F | Resp 18 | Ht 70.0 in | Wt 367.0 lb

## 2014-09-09 DIAGNOSIS — Z7982 Long term (current) use of aspirin: Secondary | ICD-10-CM | POA: Insufficient documentation

## 2014-09-09 DIAGNOSIS — N179 Acute kidney failure, unspecified: Secondary | ICD-10-CM | POA: Insufficient documentation

## 2014-09-09 DIAGNOSIS — H6692 Otitis media, unspecified, left ear: Secondary | ICD-10-CM | POA: Insufficient documentation

## 2014-09-09 DIAGNOSIS — I5021 Acute systolic (congestive) heart failure: Secondary | ICD-10-CM

## 2014-09-09 DIAGNOSIS — H6121 Impacted cerumen, right ear: Secondary | ICD-10-CM | POA: Insufficient documentation

## 2014-09-09 DIAGNOSIS — E119 Type 2 diabetes mellitus without complications: Secondary | ICD-10-CM

## 2014-09-09 DIAGNOSIS — G4733 Obstructive sleep apnea (adult) (pediatric): Secondary | ICD-10-CM

## 2014-09-09 DIAGNOSIS — E780 Pure hypercholesterolemia: Secondary | ICD-10-CM | POA: Insufficient documentation

## 2014-09-09 DIAGNOSIS — I5023 Acute on chronic systolic (congestive) heart failure: Secondary | ICD-10-CM | POA: Insufficient documentation

## 2014-09-09 DIAGNOSIS — I10 Essential (primary) hypertension: Secondary | ICD-10-CM

## 2014-09-09 DIAGNOSIS — Z79899 Other long term (current) drug therapy: Secondary | ICD-10-CM | POA: Insufficient documentation

## 2014-09-09 LAB — CMP AND LIVER
ALT: 37 U/L (ref 9–46)
AST: 27 U/L (ref 10–35)
Albumin: 4 g/dL (ref 3.6–5.1)
Alkaline Phosphatase: 71 U/L (ref 40–115)
BUN: 10 mg/dL (ref 7–25)
Bilirubin, Direct: 0.1 mg/dL (ref <=0.2)
CALCIUM: 9 mg/dL (ref 8.6–10.3)
CHLORIDE: 99 mmol/L (ref 98–110)
CO2: 25 mmol/L (ref 20–31)
Creat: 1.11 mg/dL (ref 0.70–1.33)
Glucose, Bld: 97 mg/dL (ref 65–99)
Indirect Bilirubin: 0.5 mg/dL (ref 0.2–1.2)
POTASSIUM: 4.7 mmol/L (ref 3.5–5.3)
SODIUM: 138 mmol/L (ref 135–146)
TOTAL PROTEIN: 6.8 g/dL (ref 6.1–8.1)
Total Bilirubin: 0.6 mg/dL (ref 0.2–1.2)

## 2014-09-09 MED ORDER — LISINOPRIL 10 MG PO TABS: 5.0000 mg | ORAL_TABLET | Freq: Two times a day (BID) | ORAL | Status: DC

## 2014-09-09 MED ORDER — ATORVASTATIN CALCIUM 40 MG PO TABS: 40.0000 mg | ORAL_TABLET | Freq: Every day | ORAL | Status: DC

## 2014-09-09 MED ORDER — AMOXICILLIN 500 MG PO CAPS: 500.0000 mg | ORAL_CAPSULE | Freq: Three times a day (TID) | ORAL | Status: DC

## 2014-09-09 MED ORDER — CLONIDINE HCL 0.1 MG PO TABS
0.1000 mg | ORAL_TABLET | Freq: Once | ORAL | Status: AC
  Administered 2014-09-09: 0.1 mg via ORAL
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
## 2014-09-09 NOTE — Patient Instructions (Signed)
Check your BP weekly and keep a log (can go to local pharmacy to have checked) Watch your salt and carbohydrate intake Get the sleep study done Make sure to go to the cardiology appointment Check your weight every other day and keep a log 30 minutes of walking 3-4 days per week is necessary Work on dropping some weight

## 2014-09-09 NOTE — Progress Notes (Signed)
Patient here for HFU for CHF. Patient reports pain today in right knee rated at an 8 described as throbbing. Pain has been present for 3 weeks now. Pain comes and goes.   Patient denies any SOB, wheezing, edema, and chest pain. Patient reports he has been walking more since he has been out of the hospital.   Patient has taken all his medications for today.

## 2014-09-09 NOTE — Addendum Note (Signed)
Addended byBrayton Caves on: 09/09/2014 03:23 PM   Modules accepted: Orders

## 2014-09-09 NOTE — Addendum Note (Signed)
Addended byBrayton Caves on: 09/09/2014 10:26 AM   Modules accepted: Orders

## 2014-09-12 ENCOUNTER — Other Ambulatory Visit (HOSPITAL_COMMUNITY): Payer: Self-pay | Admitting: Respiratory Therapy

## 2014-09-27 ENCOUNTER — Ambulatory Visit: Payer: Self-pay | Attending: Family Medicine | Admitting: Pharmacist

## 2014-09-27 ENCOUNTER — Ambulatory Visit: Payer: Self-pay

## 2014-09-27 VITALS — BP 144/102 | HR 66

## 2014-09-27 DIAGNOSIS — I5021 Acute systolic (congestive) heart failure: Secondary | ICD-10-CM | POA: Insufficient documentation

## 2014-09-27 DIAGNOSIS — Z87891 Personal history of nicotine dependence: Secondary | ICD-10-CM | POA: Insufficient documentation

## 2014-09-27 DIAGNOSIS — I10 Essential (primary) hypertension: Secondary | ICD-10-CM | POA: Insufficient documentation

## 2014-09-27 DIAGNOSIS — E119 Type 2 diabetes mellitus without complications: Secondary | ICD-10-CM | POA: Insufficient documentation

## 2014-09-27 MED ORDER — LISINOPRIL 10 MG PO TABS: 10.0000 mg | ORAL_TABLET | Freq: Every day | ORAL | Status: DC

## 2014-09-27 MED ORDER — ATORVASTATIN CALCIUM 40 MG PO TABS: 40.0000 mg | ORAL_TABLET | Freq: Every day | ORAL | Status: DC

## 2014-09-27 MED ORDER — AMLODIPINE BESYLATE 5 MG PO TABS: 5.0000 mg | ORAL_TABLET | Freq: Every day | ORAL | Status: DC

## 2014-09-27 NOTE — Progress Notes (Deleted)
Patient ID: John Conner, male   DOB: 04-30-1960, 54 y.o.   MRN: [id number]  S:    Patient arrives ***.    Presents for diabetes ***  Patient reports having history of Diabetes since the year of ***.   Patient {Actions; denies-reports:120008} adherence with medications. Current diabetes medications include   Patient {Actions; denies-reports:120008} hypoglycemic events.  Patient reported dietary habits:  Patient reported exercise habits:    Patient {Actions; denies-reports:120008} nocturia. Patient {Actions; denies-reports:120008} neuropathy. Patient {Actions; denies-reports:120008} visual changes.    O:  . Lab Results  Component Value Date   HGBA1C 6.6* 09/02/2014     Home fasting CBG: ***  2 hour post-prandial/random CBG: ***.  A/P: Diabetes diagnosed in *** currently ***.   {Actions; denies-reports:120008} hypoglycemic events and is able to verbalize appropriate hypoglycemia management plan.  {Actions; denies-reports:120008} adherence with medication. Control is suboptimal due to ***.  PICK 1 OF THE FOLLOWING 4 (DELETE the others AND THEN DELETE THIS LINE OF TEXT)  {Meds adjust:18428} basal insulin Lantus (insulin glargine). Patient will continue to titrate 1 unit/day if fasting CBGs > 100mg /dl until fasting CBGs reach goal or next visit.    {Meds adjust:18428} basal insulin Lantus (insulin glargine) to ***. {Meds adjust:18428} rapid insulin Novolog (insulin aspart) to ***.   {Meds adjust:18428} Victoza (liraglutide) to ***.   {Meds adjust:18428} basal insulin Lantus (insulin glargine) to ***. {Meds adjust:18428} rapid insulin Novolog (insulin aspart) to ***.      {Meds adjust:18428} Victoza (liraglutide) to ***.   Next A1C anticipated ***.  Written patient instructions provided.  Follow up in Pharmacist Clinic Visit ***.   Total time in face to face counseling *** minutes.  Patient seen with ***.

## 2014-09-27 NOTE — Patient Instructions (Signed)
Thank you for coming in today We will send your medications to the [facility] Please remember to take your medications.  Nicoletta Ba Pharmacist.
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
## 2014-10-05 ENCOUNTER — Ambulatory Visit: Payer: Self-pay

## 2014-10-11 ENCOUNTER — Encounter: Payer: Self-pay | Admitting: Pharmacist

## 2014-10-12 ENCOUNTER — Ambulatory Visit: Payer: Self-pay

## 2014-10-13 ENCOUNTER — Encounter: Payer: Self-pay | Admitting: Pharmacist

## 2014-10-18 ENCOUNTER — Encounter: Payer: Self-pay | Admitting: Pharmacist

## 2014-10-18 ENCOUNTER — Ambulatory Visit: Payer: Self-pay | Attending: Internal Medicine | Admitting: Pharmacist

## 2014-10-18 VITALS — BP 138/88 | HR 78

## 2014-10-18 DIAGNOSIS — I5021 Acute systolic (congestive) heart failure: Secondary | ICD-10-CM | POA: Insufficient documentation

## 2014-10-18 DIAGNOSIS — I10 Essential (primary) hypertension: Secondary | ICD-10-CM | POA: Insufficient documentation

## 2014-10-18 NOTE — Patient Instructions (Signed)
Thanks for coming in to see me  Schedule an appointment with me as soon as you have the [facility] so that we can adjust your medications

## 2014-10-18 NOTE — Progress Notes (Signed)
Patient arrives in good spirits.He presents to the clinic for hypertension evaluation.   Patient reports adherence with all of his medications except for atorvastatin. He is currently unable to afford atorvastatin so he has not picked it up.   Patient is working on getting his [employer]. He has a meeting with financial services here tomorrow.   Current BP Medications include:  Amlodipine 5 mg daily, furosemide 40 mg daily, labetalol 100 mg BID, lisinopril 10 mg daily.     O:   Last 3 Office BP readings: BP Readings from Last 3 Encounters:  10/18/14 138/88  09/27/14 144/102  09/09/14 141/102    BMET    Component Value Date/Time   NA 138 09/09/2014 1017   K 4.7 09/09/2014 1017   CL 99 09/09/2014 1017   CO2 25 09/09/2014 1017   GLUCOSE 97 09/09/2014 1017   BUN 10 09/09/2014 1017   CREATININE 1.11 09/09/2014 1017   CREATININE 1.33* 09/02/2014 0445   CALCIUM 9.0 09/09/2014 1017   GFRNONAA 59* 09/02/2014 0445   GFRAA >60 09/02/2014 0445   09/02/14 A1C: 6.6%  A/P:  History of hypertension which is currently controlled on current medications. I think this improvement is from the restarting of lisinopril but with heart failure, I would like to see his blood pressure lower and there is room to titrate with current medications.  Will not make medication changes at this time due to cost which will hopefully resolve once he has the orange card. Encouraged pt to continue current diet and exercise changes.   Systolic heart failure - patient currently on labetalol which is not an indicated beta blocker for LVSD. Will switch beta blocker once patient has [employer] and can afford the medication.    Results reviewed and written information provided.   F/U Clinic Visit with me once he has the orange card for medication adjustment.  Total time in face-to-face counseling 20 minutes.  Patient seen with Stephens November, PharmD, Pharmacy Resident.

## 2014-10-19 ENCOUNTER — Ambulatory Visit: Payer: Self-pay

## 2014-10-24 ENCOUNTER — Other Ambulatory Visit (HOSPITAL_COMMUNITY): Payer: Self-pay | Admitting: Respiratory Therapy

## 2014-10-25 ENCOUNTER — Ambulatory Visit: Payer: Self-pay

## 2015-03-15 MED FILL — AMLODIPINE BESYLATE 5 MG TA: 5 | 30 days supply | Qty: 30 | Fill #1

## 2015-03-15 MED FILL — ?LISINOPRIL 10 MG TABLET: 10 | 30 days supply | Qty: 30 | Fill #1

## 2015-03-15 MED FILL — ATORVASTATIN 40 MG TABLET: 40 | 30 days supply | Qty: 30 | Fill #1

## 2015-04-24 MED FILL — ATORVASTATIN 40 MG TABLET: 40 | 30 days supply | Qty: 30 | Fill #2

## 2015-04-24 MED FILL — AMLODIPINE BESYLATE 5 MG TA: 5 | 30 days supply | Qty: 30 | Fill #2

## 2015-04-24 MED FILL — LISINOPRIL 10 MG TABLET: 10 | 30 days supply | Qty: 30 | Fill #2

## 2015-06-14 MED FILL — AMLODIPINE BESYLATE 5 MG TA: 5 | 30 days supply | Qty: 30 | Fill #3

## 2015-06-14 MED FILL — LISINOPRIL 10 MG TABLET: 10 | 30 days supply | Qty: 30 | Fill #3

## 2015-06-14 MED FILL — ATORVASTATIN 40 MG TABLET: 40 | 30 days supply | Qty: 30 | Fill #3

## 2017-08-04 ENCOUNTER — Other Ambulatory Visit: Payer: Self-pay

## 2017-08-04 ENCOUNTER — Emergency Department (HOSPITAL_BASED_OUTPATIENT_CLINIC_OR_DEPARTMENT_OTHER): Payer: Self-pay

## 2017-08-04 ENCOUNTER — Encounter (HOSPITAL_BASED_OUTPATIENT_CLINIC_OR_DEPARTMENT_OTHER): Payer: Self-pay | Admitting: *Deleted

## 2017-08-04 ENCOUNTER — Emergency Department (HOSPITAL_BASED_OUTPATIENT_CLINIC_OR_DEPARTMENT_OTHER)
Admission: EM | Admit: 2017-08-04 | Discharge: 2017-08-04 | Disposition: A | Discharge status: Home / Self Care | Payer: Self-pay | Attending: Emergency Medicine | Admitting: Emergency Medicine

## 2017-08-04 DIAGNOSIS — Z87891 Personal history of nicotine dependence: Secondary | ICD-10-CM | POA: Insufficient documentation

## 2017-08-04 DIAGNOSIS — M79605 Pain in left leg: Secondary | ICD-10-CM | POA: Insufficient documentation

## 2017-08-04 DIAGNOSIS — R05 Cough: Secondary | ICD-10-CM | POA: Insufficient documentation

## 2017-08-04 DIAGNOSIS — R059 Cough, unspecified: Secondary | ICD-10-CM

## 2017-08-04 DIAGNOSIS — Z79899 Other long term (current) drug therapy: Secondary | ICD-10-CM | POA: Insufficient documentation

## 2017-08-04 DIAGNOSIS — I509 Heart failure, unspecified: Secondary | ICD-10-CM | POA: Insufficient documentation

## 2017-08-04 DIAGNOSIS — J810 Acute pulmonary edema: Secondary | ICD-10-CM | POA: Insufficient documentation

## 2017-08-04 DIAGNOSIS — R0989 Other specified symptoms and signs involving the circulatory and respiratory systems: Secondary | ICD-10-CM

## 2017-08-04 DIAGNOSIS — I110 Hypertensive heart disease with heart failure: Secondary | ICD-10-CM | POA: Insufficient documentation

## 2017-08-04 LAB — BASIC METABOLIC PANEL
ANION GAP: 9 (ref 5–15)
BUN: 12 mg/dL (ref 6–20)
CO2: 26 mmol/L (ref 22–32)
Calcium: 8.4 mg/dL — ABNORMAL LOW (ref 8.9–10.3)
Chloride: 101 mmol/L (ref 98–111)
Creatinine, Ser: 1.29 mg/dL — ABNORMAL HIGH (ref 0.61–1.24)
GFR calc Af Amer: >60 mL/min (ref >60)
GFR calc non Af Amer: 60 mL/min — ABNORMAL LOW (ref >60)
Glucose, Bld: 95 mg/dL (ref 70–99)
POTASSIUM: 3.7 mmol/L (ref 3.5–5.1)
Sodium: 136 mmol/L (ref 135–145)

## 2017-08-04 LAB — CBC
HEMATOCRIT: 49.9 % (ref 39.0–52.0)
Hemoglobin: 16.8 g/dL (ref 13.0–17.0)
MCH: 27.5 pg (ref 26.0–34.0)
MCHC: 33.7 g/dL (ref 30.0–36.0)
MCV: 81.5 fL (ref 78.0–100.0)
Platelets: 279 10*3/uL (ref 150–400)
RBC: 6.12 MIL/uL — AB (ref 4.22–5.81)
RDW: 17.4 % — ABNORMAL HIGH (ref 11.5–15.5)
WBC: 9.3 10*3/uL (ref 4.0–10.5)

## 2017-08-04 LAB — BRAIN NATRIURETIC PEPTIDE: B Natriuretic Peptide: 53.1 pg/mL (ref 0.0–100.0)

## 2017-08-04 LAB — TROPONIN I: Troponin I: <0.03 ng/mL (ref <0.03)
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
## 2017-08-04 NOTE — ED Notes (Signed)
ED Provider at bedside. 

## 2017-08-04 NOTE — ED Provider Notes (Signed)
9:26 PM BP (!) 146/92   Pulse (!) 54   Temp 98.8 F (37.1 C)   Resp 19   Ht 5\' 10"  (1.778 m)   Wt (!) 167.8 kg (370 lb)   SpO2 97%   BMI 53.09 kg/m  Patient given in sign out for follow-up on pulmonary vascular congestion, cough and leg pain.  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  No evidence of radiculopathy or concern for meralgia paresthetica.  Patient's work-up shows baseline creatinine, negative troponin, negative elevation in BNP.  Patient is not hypoxic and appears appropriate for discharge at this time.   Margarita Mail, PA-C 08/05/17 0058    Little, Wenda Overland, MD 08/05/17 [phone]

## 2017-08-04 NOTE — ED Triage Notes (Signed)
Pt /o upper thigh pain w/o injury x 2 weeks also c/o URI symptoms x 1 week

## 2017-08-04 NOTE — Discharge Instructions (Signed)
Please take Tylenol (acetaminophen) to relieve your pain.  You may take tylenol, up to 1,000 mg (two extra strength pills).  Do not take more than 3,000 mg tylenol in a 24 hour period.  Please check all medication labels as many medications such as pain and cold medications may contain tylenol. Please do not drink alcohol while taking this medication.  ° °

## 2017-08-04 NOTE — ED Notes (Addendum)
Patient c/o left lateral thigh pain.  He stated that he has to use a cane to walk while they were in [place].  I did not feel any lump or sore to site.

## 2017-12-23 ENCOUNTER — Other Ambulatory Visit: Payer: Self-pay

## 2017-12-23 ENCOUNTER — Emergency Department (HOSPITAL_BASED_OUTPATIENT_CLINIC_OR_DEPARTMENT_OTHER)
Admission: EM | Admit: 2017-12-23 | Discharge: 2017-12-23 | Disposition: A | Discharge status: Home / Self Care | Payer: Self-pay | Attending: Emergency Medicine | Admitting: Emergency Medicine

## 2017-12-23 ENCOUNTER — Encounter (HOSPITAL_BASED_OUTPATIENT_CLINIC_OR_DEPARTMENT_OTHER): Payer: Self-pay | Admitting: Emergency Medicine

## 2017-12-23 DIAGNOSIS — M545 Low back pain, unspecified: Secondary | ICD-10-CM

## 2017-12-23 DIAGNOSIS — Z87891 Personal history of nicotine dependence: Secondary | ICD-10-CM | POA: Insufficient documentation

## 2017-12-23 DIAGNOSIS — I5020 Unspecified systolic (congestive) heart failure: Secondary | ICD-10-CM | POA: Insufficient documentation

## 2017-12-23 DIAGNOSIS — I110 Hypertensive heart disease with heart failure: Secondary | ICD-10-CM | POA: Insufficient documentation

## 2017-12-23 LAB — CBC WITH DIFFERENTIAL/PLATELET
ABS IMMATURE GRANULOCYTES: 0.03 10*3/uL (ref 0.00–0.07)
BASOS PCT: 1 %
Basophils Absolute: 0.1 10*3/uL (ref 0.0–0.1)
EOS ABS: 0.2 10*3/uL (ref 0.0–0.5)
Eosinophils Relative: 2 %
HCT: 54.3 % — ABNORMAL HIGH (ref 39.0–52.0)
Hemoglobin: 17 g/dL (ref 13.0–17.0)
Immature Granulocytes: 0 %
Lymphocytes Relative: 36 %
Lymphs Abs: 3.6 10*3/uL (ref 0.7–4.0)
MCH: 26.7 pg (ref 26.0–34.0)
MCHC: 31.3 g/dL (ref 30.0–36.0)
MCV: 85.2 fL (ref 80.0–100.0)
MONO ABS: 0.6 10*3/uL (ref 0.1–1.0)
Monocytes Relative: 6 %
NEUTROS ABS: 5.6 10*3/uL (ref 1.7–7.7)
Neutrophils Relative %: 55 %
PLATELETS: 319 10*3/uL (ref 150–400)
RBC: 6.37 MIL/uL — ABNORMAL HIGH (ref 4.22–5.81)
RDW: 16.8 % — AB (ref 11.5–15.5)
WBC: 10.1 10*3/uL (ref 4.0–10.5)
nRBC: 0 % (ref 0.0–0.2)

## 2017-12-23 LAB — URINALYSIS, ROUTINE W REFLEX MICROSCOPIC
Glucose, UA: NEGATIVE mg/dL
HGB URINE DIPSTICK: NEGATIVE
KETONES UR: 15 mg/dL — AB
NITRITE: NEGATIVE
Protein, ur: NEGATIVE mg/dL
SPECIFIC GRAVITY, URINE: 1.025 (ref 1.005–1.030)
pH: 6 (ref 5.0–8.0)

## 2017-12-23 LAB — COMPREHENSIVE METABOLIC PANEL
ALT: 32 U/L (ref 0–44)
ANION GAP: 9 (ref 5–15)
AST: 28 U/L (ref 15–41)
Albumin: 4.1 g/dL (ref 3.5–5.0)
Alkaline Phosphatase: 79 U/L (ref 38–126)
BUN: 12 mg/dL (ref 6–20)
CALCIUM: 9.2 mg/dL (ref 8.9–10.3)
CO2: 25 mmol/L (ref 22–32)
Chloride: 102 mmol/L (ref 98–111)
Creatinine, Ser: 1.24 mg/dL (ref 0.61–1.24)
GFR calc non Af Amer: >60 mL/min (ref >60)
Glucose, Bld: 164 mg/dL — ABNORMAL HIGH (ref 70–99)
POTASSIUM: 3.8 mmol/L (ref 3.5–5.1)
SODIUM: 136 mmol/L (ref 135–145)
Total Bilirubin: 1 mg/dL (ref 0.3–1.2)
Total Protein: 7.6 g/dL (ref 6.5–8.1)

## 2017-12-23 LAB — URINALYSIS, MICROSCOPIC (REFLEX)

## 2017-12-23 MED ORDER — DIAZEPAM 5 MG PO TABS: 2.5000 mg | ORAL_TABLET | Freq: Three times a day (TID) | ORAL | 0 refills | Status: DC | PRN

## 2017-12-23 MED ORDER — DIAZEPAM 5 MG PO TABS
5.0000 mg | ORAL_TABLET | Freq: Once | ORAL | Status: AC
  Administered 2017-12-23: 5 mg via ORAL
  Filled 2017-12-23: qty 1

## 2017-12-23 MED FILL — diazePAM 5 MG TABS: 5 | 4 days supply | Qty: 10 | Fill #0

## 2017-12-23 NOTE — ED Triage Notes (Signed)
Non-radiating left flank pain x2 days.  Denies urinary sx.  Denies n/v/d.
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
## 2017-12-23 NOTE — ED Notes (Signed)
NAD at this time. Pt is stable and going home.  

## 2017-12-23 NOTE — ED Notes (Signed)
ED Provider at bedside. 

## 2017-12-24 LAB — URINE CULTURE: CULTURE: NO GROWTH

## 2018-07-30 ENCOUNTER — Encounter (HOSPITAL_BASED_OUTPATIENT_CLINIC_OR_DEPARTMENT_OTHER): Payer: Self-pay | Admitting: Emergency Medicine

## 2018-07-30 ENCOUNTER — Emergency Department (HOSPITAL_BASED_OUTPATIENT_CLINIC_OR_DEPARTMENT_OTHER)
Admission: EM | Admit: 2018-07-30 | Discharge: 2018-07-30 | Disposition: A | Discharge status: Other Acute Inpatient Hospital | Payer: Self-pay | Attending: Emergency Medicine | Admitting: Emergency Medicine

## 2018-07-30 ENCOUNTER — Other Ambulatory Visit: Payer: Self-pay

## 2018-07-30 ENCOUNTER — Emergency Department (HOSPITAL_BASED_OUTPATIENT_CLINIC_OR_DEPARTMENT_OTHER): Payer: Self-pay

## 2018-07-30 DIAGNOSIS — I509 Heart failure, unspecified: Secondary | ICD-10-CM | POA: Insufficient documentation

## 2018-07-30 DIAGNOSIS — Z87891 Personal history of nicotine dependence: Secondary | ICD-10-CM | POA: Insufficient documentation

## 2018-07-30 DIAGNOSIS — Z20828 Contact with and (suspected) exposure to other viral communicable diseases: Secondary | ICD-10-CM | POA: Insufficient documentation

## 2018-07-30 DIAGNOSIS — I110 Hypertensive heart disease with heart failure: Secondary | ICD-10-CM | POA: Insufficient documentation

## 2018-07-30 DIAGNOSIS — R55 Syncope and collapse: Secondary | ICD-10-CM | POA: Insufficient documentation

## 2018-07-30 DIAGNOSIS — Z7982 Long term (current) use of aspirin: Secondary | ICD-10-CM | POA: Insufficient documentation

## 2018-07-30 DIAGNOSIS — Z79899 Other long term (current) drug therapy: Secondary | ICD-10-CM | POA: Insufficient documentation

## 2018-07-30 DIAGNOSIS — R42 Dizziness and giddiness: Secondary | ICD-10-CM | POA: Insufficient documentation

## 2018-07-30 LAB — CBC WITH DIFFERENTIAL/PLATELET
Abs Immature Granulocytes: 0.01 10*3/uL (ref 0.00–0.07)
Basophils Absolute: 0.1 10*3/uL (ref 0.0–0.1)
Basophils Relative: 1 %
Eosinophils Absolute: 0.2 10*3/uL (ref 0.0–0.5)
Eosinophils Relative: 2 %
HCT: 57.7 % — ABNORMAL HIGH (ref 39.0–52.0)
Hemoglobin: 18.3 g/dL — ABNORMAL HIGH (ref 13.0–17.0)
Immature Granulocytes: 0 %
Lymphocytes Relative: 31 %
Lymphs Abs: 3.3 10*3/uL (ref 0.7–4.0)
MCH: 26.6 pg (ref 26.0–34.0)
MCHC: 31.7 g/dL (ref 30.0–36.0)
MCV: 83.7 fL (ref 80.0–100.0)
Monocytes Absolute: 1.1 10*3/uL — ABNORMAL HIGH (ref 0.1–1.0)
Monocytes Relative: 11 %
Neutro Abs: 5.9 10*3/uL (ref 1.7–7.7)
Neutrophils Relative %: 55 %
Platelets: 370 10*3/uL (ref 150–400)
RBC: 6.89 MIL/uL — ABNORMAL HIGH (ref 4.22–5.81)
RDW: 16.9 % — ABNORMAL HIGH (ref 11.5–15.5)
WBC: 10.7 10*3/uL — ABNORMAL HIGH (ref 4.0–10.5)
nRBC: 0 % (ref 0.0–0.2)

## 2018-07-30 LAB — BASIC METABOLIC PANEL
Anion gap: 11 (ref 5–15)
BUN: 14 mg/dL (ref 6–20)
CO2: 28 mmol/L (ref 22–32)
Calcium: 9.9 mg/dL (ref 8.9–10.3)
Chloride: 98 mmol/L (ref 98–111)
Creatinine, Ser: 1.1 mg/dL (ref 0.61–1.24)
GFR calc Af Amer: >60 mL/min (ref >60)
GFR calc non Af Amer: >60 mL/min (ref >60)
Glucose, Bld: 104 mg/dL — ABNORMAL HIGH (ref 70–99)
Potassium: 3.5 mmol/L (ref 3.5–5.1)
Sodium: 137 mmol/L (ref 135–145)

## 2018-07-30 LAB — BRAIN NATRIURETIC PEPTIDE: B Natriuretic Peptide: 68.9 pg/mL (ref 0.0–100.0)

## 2018-07-30 LAB — SARS CORONAVIRUS 2 AG (30 MIN TAT): SARS Coronavirus 2 Ag: NEGATIVE

## 2018-07-30 LAB — TROPONIN I (HIGH SENSITIVITY)
Troponin I (High Sensitivity): 33 ng/L — ABNORMAL HIGH (ref <18)
Troponin I (High Sensitivity): 26 ng/L — ABNORMAL HIGH (ref <18)

## 2018-07-30 LAB — MAGNESIUM: Magnesium: 2.3 mg/dL (ref 1.7–2.4)
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
## 2018-07-30 NOTE — ED Triage Notes (Signed)
Pt having dizziness and headache for two days.  Pt states dizziness and headache is intermittent.  Denies any chest pain, no weakness.  Pt is on several medications for BP but pt has not noticed any low readings at home.  No changes in medication, eating or sleeping patterns.

## 2020-01-09 ENCOUNTER — Encounter (HOSPITAL_BASED_OUTPATIENT_CLINIC_OR_DEPARTMENT_OTHER): Payer: Self-pay | Admitting: *Deleted

## 2020-01-09 ENCOUNTER — Other Ambulatory Visit: Payer: Self-pay

## 2020-01-09 ENCOUNTER — Emergency Department (HOSPITAL_BASED_OUTPATIENT_CLINIC_OR_DEPARTMENT_OTHER)
Admission: EM | Admit: 2020-01-09 | Discharge: 2020-01-09 | Disposition: A | Discharge status: Home / Self Care | Payer: Self-pay | Attending: Emergency Medicine | Admitting: Emergency Medicine

## 2020-01-09 DIAGNOSIS — I110 Hypertensive heart disease with heart failure: Secondary | ICD-10-CM | POA: Insufficient documentation

## 2020-01-09 DIAGNOSIS — Z7982 Long term (current) use of aspirin: Secondary | ICD-10-CM | POA: Insufficient documentation

## 2020-01-09 DIAGNOSIS — Z87891 Personal history of nicotine dependence: Secondary | ICD-10-CM | POA: Insufficient documentation

## 2020-01-09 DIAGNOSIS — Z79899 Other long term (current) drug therapy: Secondary | ICD-10-CM | POA: Insufficient documentation

## 2020-01-09 DIAGNOSIS — R1013 Epigastric pain: Secondary | ICD-10-CM | POA: Insufficient documentation

## 2020-01-09 DIAGNOSIS — I5021 Acute systolic (congestive) heart failure: Secondary | ICD-10-CM | POA: Insufficient documentation

## 2020-01-09 MED ORDER — METOCLOPRAMIDE HCL 5 MG PO TABS: 5.0000 mg | ORAL_TABLET | Freq: Three times a day (TID) | ORAL | 0 refills | Status: DC | PRN

## 2020-01-09 MED ORDER — ALUM & MAG HYDROXIDE-SIMETH 200-200-20 MG/5ML PO SUSP
30.0000 mL | Freq: Once | ORAL | Status: AC
  Administered 2020-01-09: 30 mL via ORAL
  Filled 2020-01-09: qty 30

## 2020-01-09 MED ORDER — SUCRALFATE 1 G PO TABS: 1.0000 g | ORAL_TABLET | Freq: Three times a day (TID) | ORAL | 0 refills | Status: DC

## 2020-01-09 MED ORDER — METOCLOPRAMIDE HCL 10 MG PO TABS
5.0000 mg | ORAL_TABLET | Freq: Once | ORAL | Status: AC
  Administered 2020-01-09: 5 mg via ORAL
  Filled 2020-01-09: qty 1

## 2020-01-09 MED ORDER — LIDOCAINE VISCOUS HCL 2 % MT SOLN
15.0000 mL | Freq: Once | OROMUCOSAL | Status: AC
  Administered 2020-01-09: 15 mL via ORAL
  Filled 2020-01-09: qty 15

## 2020-01-09 NOTE — Discharge Instructions (Signed)
Please return if you have worsening pain, fever, black or bloody stools. follow-up with your primary care doctor.

## 2020-01-09 NOTE — ED Triage Notes (Signed)
Pt c/o upper abd pain that started a week ago.  Last stool was 2 days ago. States BM was loose. Has taken tums without relief. Denies any black stools. Has been seen for same and started on prilosec. Denies any vomiting.
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
## 2021-01-27 ENCOUNTER — Inpatient Hospital Stay (HOSPITAL_BASED_OUTPATIENT_CLINIC_OR_DEPARTMENT_OTHER)
Admission: EM | Admit: 2021-01-27 | Discharge: 2021-02-07 | DRG: 286 | Disposition: A | Discharge status: Home / Self Care | Payer: No Typology Code available for payment source | Attending: Internal Medicine | Admitting: Internal Medicine

## 2021-01-27 ENCOUNTER — Encounter (HOSPITAL_BASED_OUTPATIENT_CLINIC_OR_DEPARTMENT_OTHER): Payer: Self-pay | Admitting: Emergency Medicine

## 2021-01-27 ENCOUNTER — Other Ambulatory Visit: Payer: Self-pay

## 2021-01-27 ENCOUNTER — Emergency Department (HOSPITAL_BASED_OUTPATIENT_CLINIC_OR_DEPARTMENT_OTHER): Payer: No Typology Code available for payment source

## 2021-01-27 DIAGNOSIS — I4729 Other ventricular tachycardia: Secondary | ICD-10-CM

## 2021-01-27 DIAGNOSIS — I110 Hypertensive heart disease with heart failure: Principal | ICD-10-CM | POA: Diagnosis present

## 2021-01-27 DIAGNOSIS — Z87891 Personal history of nicotine dependence: Secondary | ICD-10-CM | POA: Diagnosis not present

## 2021-01-27 DIAGNOSIS — E119 Type 2 diabetes mellitus without complications: Secondary | ICD-10-CM | POA: Diagnosis present

## 2021-01-27 DIAGNOSIS — E1169 Type 2 diabetes mellitus with other specified complication: Secondary | ICD-10-CM

## 2021-01-27 DIAGNOSIS — Z8616 Personal history of COVID-19: Secondary | ICD-10-CM | POA: Diagnosis not present

## 2021-01-27 DIAGNOSIS — I5033 Acute on chronic diastolic (congestive) heart failure: Secondary | ICD-10-CM | POA: Diagnosis present

## 2021-01-27 DIAGNOSIS — J9601 Acute respiratory failure with hypoxia: Secondary | ICD-10-CM | POA: Diagnosis not present

## 2021-01-27 DIAGNOSIS — I5043 Acute on chronic combined systolic (congestive) and diastolic (congestive) heart failure: Secondary | ICD-10-CM | POA: Diagnosis not present

## 2021-01-27 DIAGNOSIS — I420 Dilated cardiomyopathy: Secondary | ICD-10-CM | POA: Diagnosis present

## 2021-01-27 DIAGNOSIS — Z833 Family history of diabetes mellitus: Secondary | ICD-10-CM | POA: Diagnosis not present

## 2021-01-27 DIAGNOSIS — E7800 Pure hypercholesterolemia, unspecified: Secondary | ICD-10-CM | POA: Diagnosis present

## 2021-01-27 DIAGNOSIS — Z8249 Family history of ischemic heart disease and other diseases of the circulatory system: Secondary | ICD-10-CM

## 2021-01-27 DIAGNOSIS — I509 Heart failure, unspecified: Secondary | ICD-10-CM

## 2021-01-27 DIAGNOSIS — Z7982 Long term (current) use of aspirin: Secondary | ICD-10-CM | POA: Diagnosis not present

## 2021-01-27 DIAGNOSIS — I77810 Thoracic aortic ectasia: Secondary | ICD-10-CM | POA: Diagnosis not present

## 2021-01-27 DIAGNOSIS — R0602 Shortness of breath: Secondary | ICD-10-CM | POA: Diagnosis not present

## 2021-01-27 DIAGNOSIS — I10 Essential (primary) hypertension: Secondary | ICD-10-CM | POA: Diagnosis present

## 2021-01-27 DIAGNOSIS — Z91119 Patient's noncompliance with dietary regimen due to unspecified reason: Secondary | ICD-10-CM | POA: Diagnosis not present

## 2021-01-27 DIAGNOSIS — R9431 Abnormal electrocardiogram [ECG] [EKG]: Secondary | ICD-10-CM

## 2021-01-27 DIAGNOSIS — Z23 Encounter for immunization: Secondary | ICD-10-CM

## 2021-01-27 DIAGNOSIS — I214 Non-ST elevation (NSTEMI) myocardial infarction: Secondary | ICD-10-CM

## 2021-01-27 DIAGNOSIS — Z9114 Patient's other noncompliance with medication regimen: Secondary | ICD-10-CM | POA: Diagnosis not present

## 2021-01-27 DIAGNOSIS — R0902 Hypoxemia: Secondary | ICD-10-CM

## 2021-01-27 DIAGNOSIS — E669 Obesity, unspecified: Secondary | ICD-10-CM

## 2021-01-27 DIAGNOSIS — I472 Ventricular tachycardia, unspecified: Secondary | ICD-10-CM | POA: Diagnosis present

## 2021-01-27 DIAGNOSIS — Z7984 Long term (current) use of oral hypoglycemic drugs: Secondary | ICD-10-CM | POA: Diagnosis not present

## 2021-01-27 DIAGNOSIS — G4733 Obstructive sleep apnea (adult) (pediatric): Secondary | ICD-10-CM | POA: Diagnosis present

## 2021-01-27 DIAGNOSIS — Z6841 Body Mass Index (BMI) 40.0 and over, adult: Secondary | ICD-10-CM | POA: Diagnosis not present

## 2021-01-27 DIAGNOSIS — Z79899 Other long term (current) drug therapy: Secondary | ICD-10-CM

## 2021-01-27 LAB — CBC WITH DIFFERENTIAL/PLATELET
Abs Immature Granulocytes: 0.04 10*3/uL (ref 0.00–0.07)
Basophils Absolute: 0.1 10*3/uL (ref 0.0–0.1)
Basophils Relative: 1 %
Eosinophils Absolute: 0.3 10*3/uL (ref 0.0–0.5)
Eosinophils Relative: 2 %
HCT: 51.9 % (ref 39.0–52.0)
Hemoglobin: 16.2 g/dL (ref 13.0–17.0)
Immature Granulocytes: 0 %
Lymphocytes Relative: 41 %
Lymphs Abs: 5.2 10*3/uL — ABNORMAL HIGH (ref 0.7–4.0)
MCH: 26.6 pg (ref 26.0–34.0)
MCHC: 31.2 g/dL (ref 30.0–36.0)
MCV: 85.1 fL (ref 80.0–100.0)
Monocytes Absolute: 0.7 10*3/uL (ref 0.1–1.0)
Monocytes Relative: 6 %
Neutro Abs: 6.4 10*3/uL (ref 1.7–7.7)
Neutrophils Relative %: 50 %
Platelets: 340 10*3/uL (ref 150–400)
RBC: 6.1 MIL/uL — ABNORMAL HIGH (ref 4.22–5.81)
RDW: 17.1 % — ABNORMAL HIGH (ref 11.5–15.5)
WBC: 12.8 10*3/uL — ABNORMAL HIGH (ref 4.0–10.5)
nRBC: 0 % (ref 0.0–0.2)

## 2021-01-27 LAB — COMPREHENSIVE METABOLIC PANEL
ALT: 77 U/L — ABNORMAL HIGH (ref 0–44)
AST: 54 U/L — ABNORMAL HIGH (ref 15–41)
Albumin: 3.9 g/dL (ref 3.5–5.0)
Alkaline Phosphatase: 72 U/L (ref 38–126)
Anion gap: 11 (ref 5–15)
BUN: 13 mg/dL (ref 6–20)
CO2: 25 mmol/L (ref 22–32)
Calcium: 8.6 mg/dL — ABNORMAL LOW (ref 8.9–10.3)
Chloride: 102 mmol/L (ref 98–111)
Creatinine, Ser: 1.21 mg/dL (ref 0.61–1.24)
GFR, Estimated: >60 mL/min (ref >60)
Glucose, Bld: 131 mg/dL — ABNORMAL HIGH (ref 70–99)
Potassium: 3.6 mmol/L (ref 3.5–5.1)
Sodium: 138 mmol/L (ref 135–145)
Total Bilirubin: 1 mg/dL (ref 0.3–1.2)
Total Protein: 7.3 g/dL (ref 6.5–8.1)

## 2021-01-27 LAB — RESP PANEL BY RT-PCR (FLU A&B, COVID) ARPGX2
Influenza A by PCR: NEGATIVE
Influenza B by PCR: NEGATIVE
SARS Coronavirus 2 by RT PCR: NEGATIVE

## 2021-01-27 LAB — TROPONIN I (HIGH SENSITIVITY)
Troponin I (High Sensitivity): 129 ng/L (ref <18)
Troponin I (High Sensitivity): 141 ng/L (ref <18)

## 2021-01-27 LAB — MAGNESIUM: Magnesium: 2 mg/dL (ref 1.7–2.4)

## 2021-01-27 LAB — BRAIN NATRIURETIC PEPTIDE: B Natriuretic Peptide: 765.8 pg/mL — ABNORMAL HIGH (ref 0.0–100.0)

## 2021-01-27 LAB — LACTIC ACID, PLASMA: Lactic Acid, Venous: 1.2 mmol/L (ref 0.5–1.9)

## 2021-01-27 MED ORDER — LABETALOL HCL 5 MG/ML IV SOLN: 5.0000 mg | Freq: Once | INTRAVENOUS | Status: DC

## 2021-01-27 MED ORDER — SODIUM CHLORIDE 0.9 % IV SOLN: 1.0000 g | Freq: Once | INTRAVENOUS | Status: DC

## 2021-01-27 MED ORDER — MAGNESIUM SULFATE IN D5W 1-5 GM/100ML-% IV SOLN
1.0000 g | Freq: Once | INTRAVENOUS | Status: AC
  Administered 2021-01-27: 1 g via INTRAVENOUS
  Filled 2021-01-27: qty 100

## 2021-01-27 MED ORDER — FUROSEMIDE 10 MG/ML IJ SOLN
40.0000 mg | Freq: Once | INTRAMUSCULAR | Status: AC
  Administered 2021-01-27: 40 mg via INTRAVENOUS
  Filled 2021-01-27: qty 4

## 2021-01-27 MED ORDER — POTASSIUM CHLORIDE CRYS ER 20 MEQ PO TBCR
40.0000 meq | EXTENDED_RELEASE_TABLET | Freq: Once | ORAL | Status: AC
  Administered 2021-01-27: 40 meq via ORAL
  Filled 2021-01-27: qty 2

## 2021-01-27 MED ORDER — ALBUTEROL SULFATE HFA 108 (90 BASE) MCG/ACT IN AERS
2.0000 | INHALATION_SPRAY | RESPIRATORY_TRACT | Status: DC | PRN
  Administered 2021-01-27: 2 via RESPIRATORY_TRACT
  Filled 2021-01-27: qty 6.7

## 2021-01-27 MED ORDER — NITROGLYCERIN 2 % TD OINT
0.5000 [in_us] | TOPICAL_OINTMENT | Freq: Once | TRANSDERMAL | Status: AC
  Administered 2021-01-27: 0.5 [in_us] via TOPICAL
  Filled 2021-01-27: qty 1

## 2021-01-27 MED ORDER — CALCIUM GLUCONATE-NACL 1-0.675 GM/50ML-% IV SOLN
1.0000 g | Freq: Once | INTRAVENOUS | Status: AC
  Administered 2021-01-27: 1000 mg via INTRAVENOUS
  Filled 2021-01-27: qty 50
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
## 2021-01-27 NOTE — ED Triage Notes (Addendum)
Pt states he has been out of all of his medications x 3 days. Reports leg swelling and SHOB. Denies CP at this time. Pt profusely diaphoretic on arrival, oxygen 94%.

## 2021-01-27 NOTE — ED Provider Notes (Signed)
11:31 PM Patient had a nonsustained run of 8 beats of ventricular tachycardia:    Discussed with the on-call cardiologist who recommends against amiodarone at this time.  He does recommend we correct any electrolyte abnormalities.  He does have a slightly low calcium level and we will administer a gram of calcium gluconate.     Shanon Rosser, MD 01/27/21 [phone]

## 2021-01-27 NOTE — ED Notes (Signed)
Report called to Glenna Durand, RN.

## 2021-01-27 NOTE — ED Notes (Signed)
[facility] to bedside to assume care of patient. ?

## 2021-01-27 NOTE — Progress Notes (Signed)
Patient: John Conner, John Conner  DOB: 04/14/1960 PNT:[id number] Transferring facility: [facility] Requesting provider: Pati Gallo, PA (EDP at [facility]) Reason for transfer: admission for further evaluation and management of acute on chronic diastolic heart failure.  60 year old male with reported history of chronic diastolic heart failure, who presented to [facility] ED complaining of 2 to 3 days of progressive shortness of breath, with work-up notable for elevated BNP, chest x-ray showing evidence of pulmonary edema.  Initial oxygen saturation 88% on room air, subsequently improving into the mid 90s on 2 L nasal cannula in the context of no known baseline supplemental oxygen requirements.  Was also initially hypertensive, that is improved with Nitropaste and Lasix 40 mg IV x1.  The patient reportedly ran out of multiple of his home medications 1 week ago, including home Lasix 40 mg p.o. daily as well as HCTZ.  Was recently diagnosed with COVID.   Subsequently, I accepted this patient for transfer for inpatient admission to a cardiac telemetry bed at [facility] for further work-up and management of acute on chronic diastolic heart failure.    Babs Bertin, DO Hospitalist
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
## 2021-01-28 ENCOUNTER — Encounter (HOSPITAL_COMMUNITY): Payer: Self-pay | Admitting: Internal Medicine

## 2021-01-28 ENCOUNTER — Inpatient Hospital Stay (HOSPITAL_COMMUNITY): Payer: No Typology Code available for payment source

## 2021-01-28 DIAGNOSIS — Z9114 Patient's other noncompliance with medication regimen: Secondary | ICD-10-CM | POA: Diagnosis not present

## 2021-01-28 DIAGNOSIS — Z6841 Body Mass Index (BMI) 40.0 and over, adult: Secondary | ICD-10-CM | POA: Diagnosis not present

## 2021-01-28 DIAGNOSIS — I5033 Acute on chronic diastolic (congestive) heart failure: Secondary | ICD-10-CM | POA: Diagnosis not present

## 2021-01-28 DIAGNOSIS — E1169 Type 2 diabetes mellitus with other specified complication: Secondary | ICD-10-CM | POA: Diagnosis not present

## 2021-01-28 DIAGNOSIS — I509 Heart failure, unspecified: Secondary | ICD-10-CM | POA: Diagnosis not present

## 2021-01-28 DIAGNOSIS — I5043 Acute on chronic combined systolic (congestive) and diastolic (congestive) heart failure: Secondary | ICD-10-CM | POA: Diagnosis present

## 2021-01-28 DIAGNOSIS — R0902 Hypoxemia: Secondary | ICD-10-CM

## 2021-01-28 DIAGNOSIS — Z8616 Personal history of COVID-19: Secondary | ICD-10-CM | POA: Diagnosis not present

## 2021-01-28 DIAGNOSIS — I77810 Thoracic aortic ectasia: Secondary | ICD-10-CM | POA: Diagnosis present

## 2021-01-28 DIAGNOSIS — J9601 Acute respiratory failure with hypoxia: Secondary | ICD-10-CM | POA: Diagnosis present

## 2021-01-28 DIAGNOSIS — I10 Essential (primary) hypertension: Secondary | ICD-10-CM | POA: Diagnosis not present

## 2021-01-28 DIAGNOSIS — Z7984 Long term (current) use of oral hypoglycemic drugs: Secondary | ICD-10-CM | POA: Diagnosis not present

## 2021-01-28 DIAGNOSIS — Z87891 Personal history of nicotine dependence: Secondary | ICD-10-CM | POA: Diagnosis not present

## 2021-01-28 DIAGNOSIS — I214 Non-ST elevation (NSTEMI) myocardial infarction: Secondary | ICD-10-CM

## 2021-01-28 DIAGNOSIS — Z8249 Family history of ischemic heart disease and other diseases of the circulatory system: Secondary | ICD-10-CM | POA: Diagnosis not present

## 2021-01-28 DIAGNOSIS — R0602 Shortness of breath: Secondary | ICD-10-CM | POA: Diagnosis present

## 2021-01-28 DIAGNOSIS — I420 Dilated cardiomyopathy: Secondary | ICD-10-CM | POA: Diagnosis present

## 2021-01-28 DIAGNOSIS — I472 Ventricular tachycardia, unspecified: Secondary | ICD-10-CM | POA: Diagnosis present

## 2021-01-28 DIAGNOSIS — I110 Hypertensive heart disease with heart failure: Secondary | ICD-10-CM | POA: Diagnosis present

## 2021-01-28 DIAGNOSIS — Z91119 Patient's noncompliance with dietary regimen due to unspecified reason: Secondary | ICD-10-CM | POA: Diagnosis not present

## 2021-01-28 DIAGNOSIS — E669 Obesity, unspecified: Secondary | ICD-10-CM

## 2021-01-28 DIAGNOSIS — E7800 Pure hypercholesterolemia, unspecified: Secondary | ICD-10-CM | POA: Diagnosis present

## 2021-01-28 DIAGNOSIS — R9431 Abnormal electrocardiogram [ECG] [EKG]: Secondary | ICD-10-CM

## 2021-01-28 DIAGNOSIS — G4733 Obstructive sleep apnea (adult) (pediatric): Secondary | ICD-10-CM

## 2021-01-28 DIAGNOSIS — Z7982 Long term (current) use of aspirin: Secondary | ICD-10-CM | POA: Diagnosis not present

## 2021-01-28 DIAGNOSIS — Z79899 Other long term (current) drug therapy: Secondary | ICD-10-CM | POA: Diagnosis not present

## 2021-01-28 DIAGNOSIS — E119 Type 2 diabetes mellitus without complications: Secondary | ICD-10-CM | POA: Diagnosis present

## 2021-01-28 DIAGNOSIS — Z23 Encounter for immunization: Secondary | ICD-10-CM | POA: Diagnosis not present

## 2021-01-28 DIAGNOSIS — Z833 Family history of diabetes mellitus: Secondary | ICD-10-CM | POA: Diagnosis not present

## 2021-01-28 LAB — GLUCOSE, CAPILLARY
Glucose-Capillary: 132 mg/dL — ABNORMAL HIGH (ref 70–99)
Glucose-Capillary: 120 mg/dL — ABNORMAL HIGH (ref 70–99)
Glucose-Capillary: 105 mg/dL — ABNORMAL HIGH (ref 70–99)
Glucose-Capillary: 103 mg/dL — ABNORMAL HIGH (ref 70–99)
Glucose-Capillary: 110 mg/dL — ABNORMAL HIGH (ref 70–99)

## 2021-01-28 LAB — CBC
HCT: 51.2 % (ref 39.0–52.0)
Hemoglobin: 15.7 g/dL (ref 13.0–17.0)
MCH: 26.4 pg (ref 26.0–34.0)
MCHC: 30.7 g/dL (ref 30.0–36.0)
MCV: 86.1 fL (ref 80.0–100.0)
Platelets: 290 10*3/uL (ref 150–400)
RBC: 5.95 MIL/uL — ABNORMAL HIGH (ref 4.22–5.81)
RDW: 16.7 % — ABNORMAL HIGH (ref 11.5–15.5)
WBC: 11.7 10*3/uL — ABNORMAL HIGH (ref 4.0–10.5)
nRBC: 0 % (ref 0.0–0.2)

## 2021-01-28 LAB — TSH: TSH: 1.003 u[IU]/mL (ref 0.350–4.500)

## 2021-01-28 LAB — TROPONIN I (HIGH SENSITIVITY): Troponin I (High Sensitivity): 145 ng/L (ref <18)

## 2021-01-28 LAB — HEMOGLOBIN A1C
Hgb A1c MFr Bld: 6.4 % — ABNORMAL HIGH (ref 4.8–5.6)
Mean Plasma Glucose: 136.98 mg/dL

## 2021-01-28 LAB — HIV ANTIBODY (ROUTINE TESTING W REFLEX): HIV Screen 4th Generation wRfx: NONREACTIVE

## 2021-01-28 MED ORDER — CARVEDILOL 6.25 MG PO TABS
6.2500 mg | ORAL_TABLET | Freq: Two times a day (BID) | ORAL | Status: DC
  Administered 2021-01-28 – 2021-02-07 (×20): 6.25 mg via ORAL
  Filled 2021-01-28 (×20): qty 1

## 2021-01-28 MED ORDER — POTASSIUM CHLORIDE CRYS ER 20 MEQ PO TBCR
20.0000 meq | EXTENDED_RELEASE_TABLET | Freq: Every day | ORAL | Status: DC
Start: 2021-01-28 — End: 2021-02-07
  Administered 2021-01-29 – 2021-02-07 (×10): 20 meq via ORAL
  Filled 2021-01-28 (×11): qty 1

## 2021-01-28 MED ORDER — ENOXAPARIN SODIUM 40 MG/0.4ML IJ SOSY
40.0000 mg | PREFILLED_SYRINGE | INTRAMUSCULAR | Status: DC
  Administered 2021-01-28: 40 mg via SUBCUTANEOUS
  Filled 2021-01-28: qty 0.4

## 2021-01-28 MED ORDER — INSULIN ASPART 100 UNIT/ML IJ SOLN
6.0000 [IU] | Freq: Three times a day (TID) | INTRAMUSCULAR | Status: DC
  Administered 2021-01-28 – 2021-02-07 (×29): 6 [IU] via SUBCUTANEOUS

## 2021-01-28 MED ORDER — GUAIFENESIN-DM 100-10 MG/5ML PO SYRP
5.0000 mL | ORAL_SOLUTION | ORAL | Status: DC | PRN
  Administered 2021-01-28 – 2021-02-03 (×14): 5 mL via ORAL
  Filled 2021-01-28 (×14): qty 5

## 2021-01-28 MED ORDER — ASPIRIN EC 81 MG PO TBEC
81.0000 mg | DELAYED_RELEASE_TABLET | Freq: Every day | ORAL | Status: DC
  Administered 2021-01-28 – 2021-02-07 (×11): 81 mg via ORAL
  Filled 2021-01-28 (×12): qty 1

## 2021-01-28 MED ORDER — SODIUM CHLORIDE 0.9 % IV SOLN: 250.0000 mL | INTRAVENOUS | Status: DC | PRN

## 2021-01-28 MED ORDER — SODIUM CHLORIDE 0.9% FLUSH: 3.0000 mL | INTRAVENOUS | Status: DC | PRN

## 2021-01-28 MED ORDER — PERFLUTREN LIPID MICROSPHERE
1.0000 mL | INTRAVENOUS | Status: AC | PRN
  Administered 2021-01-28: 2 mL via INTRAVENOUS
  Filled 2021-01-28: qty 10

## 2021-01-28 MED ORDER — ATORVASTATIN CALCIUM 40 MG PO TABS
40.0000 mg | ORAL_TABLET | Freq: Every day | ORAL | Status: DC
  Administered 2021-01-28 – 2021-02-07 (×11): 40 mg via ORAL
  Filled 2021-01-28 (×11): qty 1

## 2021-01-28 MED ORDER — FUROSEMIDE 10 MG/ML IJ SOLN
40.0000 mg | Freq: Two times a day (BID) | INTRAMUSCULAR | Status: AC
  Administered 2021-01-28 – 2021-01-29 (×3): 40 mg via INTRAVENOUS
  Filled 2021-01-28 (×3): qty 4

## 2021-01-28 MED ORDER — INSULIN ASPART 100 UNIT/ML IJ SOLN: 0.0000 [IU] | Freq: Three times a day (TID) | INTRAMUSCULAR | Status: DC

## 2021-01-28 MED ORDER — CHLORHEXIDINE GLUCONATE 0.12 % MT SOLN
15.0000 mL | Freq: Two times a day (BID) | OROMUCOSAL | Status: DC
  Administered 2021-01-28 – 2021-02-06 (×18): 15 mL via OROMUCOSAL
  Filled 2021-01-28 (×18): qty 15

## 2021-01-28 MED ORDER — AMLODIPINE BESYLATE 5 MG PO TABS: 5.0000 mg | ORAL_TABLET | Freq: Every day | ORAL | Status: DC

## 2021-01-28 MED ORDER — LOSARTAN POTASSIUM 25 MG PO TABS: 25.0000 mg | ORAL_TABLET | Freq: Every day | ORAL | Status: DC

## 2021-01-28 MED ORDER — CARVEDILOL 12.5 MG PO TABS: 12.5000 mg | ORAL_TABLET | Freq: Two times a day (BID) | ORAL | Status: DC

## 2021-01-28 MED ORDER — INSULIN ASPART 100 UNIT/ML IJ SOLN
0.0000 [IU] | Freq: Three times a day (TID) | INTRAMUSCULAR | Status: DC
  Administered 2021-01-29 – 2021-02-06 (×13): 2 [IU] via SUBCUTANEOUS

## 2021-01-28 MED ORDER — METFORMIN HCL 500 MG PO TABS: 500.0000 mg | ORAL_TABLET | Freq: Two times a day (BID) | ORAL | Status: DC

## 2021-01-28 MED ORDER — INSULIN ASPART 100 UNIT/ML IJ SOLN: 0.0000 [IU] | Freq: Every day | INTRAMUSCULAR | Status: DC

## 2021-01-28 MED ORDER — INFLUENZA VAC SPLIT QUAD 0.5 ML IM SUSY
0.5000 mL | PREFILLED_SYRINGE | INTRAMUSCULAR | Status: AC
  Administered 2021-02-06: 0.5 mL via INTRAMUSCULAR
  Filled 2021-01-28: qty 0.5

## 2021-01-28 MED ORDER — POTASSIUM CHLORIDE CRYS ER 20 MEQ PO TBCR
40.0000 meq | EXTENDED_RELEASE_TABLET | Freq: Two times a day (BID) | ORAL | Status: AC
  Administered 2021-01-28 (×2): 40 meq via ORAL
  Filled 2021-01-28 (×2): qty 2

## 2021-01-28 MED ORDER — ACETAMINOPHEN 325 MG PO TABS
650.0000 mg | ORAL_TABLET | ORAL | Status: DC | PRN
  Administered 2021-01-28 – 2021-02-06 (×9): 650 mg via ORAL
  Filled 2021-01-28 (×9): qty 2

## 2021-01-28 MED ORDER — LISINOPRIL 20 MG PO TABS: 40.0000 mg | ORAL_TABLET | Freq: Every day | ORAL | Status: DC

## 2021-01-28 MED ORDER — LIVING BETTER WITH HEART FAILURE BOOK: Freq: Once | Status: AC

## 2021-01-28 MED ORDER — SODIUM CHLORIDE 0.9% FLUSH
3.0000 mL | Freq: Two times a day (BID) | INTRAVENOUS | Status: DC
  Administered 2021-01-28 – 2021-01-31 (×8): 3 mL via INTRAVENOUS

## 2021-01-28 MED ORDER — EMPAGLIFLOZIN 10 MG PO TABS
10.0000 mg | ORAL_TABLET | Freq: Every day | ORAL | Status: DC
  Administered 2021-01-28 – 2021-02-07 (×11): 10 mg via ORAL
  Filled 2021-01-28 (×11): qty 1

## 2021-01-28 MED ORDER — ENOXAPARIN SODIUM 100 MG/ML IJ SOSY
85.0000 mg | PREFILLED_SYRINGE | INTRAMUSCULAR | Status: DC
  Administered 2021-01-29 – 2021-01-31 (×3): 85 mg via SUBCUTANEOUS
  Filled 2021-01-28 (×3): qty 1

## 2021-01-28 MED ORDER — HYDROCHLOROTHIAZIDE 25 MG PO TABS: 25.0000 mg | ORAL_TABLET | Freq: Every day | ORAL | Status: DC

## 2021-01-28 NOTE — Progress Notes (Addendum)
Monitor alarmed for HR 140, patient had 25 beat run of SVT.  Patient states he just sat up in bed. Denies CP/SOB/dizziness. HR 70s NSR with frequent PACs and PVCs.  BP 129/79.  Dr. Aileen Fass notified.  No new orders at this time.  Will continue to monitor.

## 2021-01-28 NOTE — Progress Notes (Addendum)
[facility] PROGRESS NOTE    Progress Note  John Conner  OZD:[id number] DOB: 24-May-1960 DOA: 01/27/2021 PCP: Jonathon Resides, MD     Brief Narrative:   John Conner is an 61 y.o. male past medical history significant for essential hypertension, diabetes mellitus type 2, chronic systolic heart failure with a last EF of 40% in 2016, morbid obesity comes in for progressive shortness of breath over the last week, in the ED was found to be hypoxic started on nitroglycerin initial troponin 0 129, BNP was 700 white count of 12,800, chest x-ray showed vascular congestion    Assessment/Plan:   Acute respiratory failure with hypoxia secondary to acute on chronic combined systolic and diastolic heart failure  2D echo is pending. He was started on IV Lasix twice a day. Monitor strict I's and O's and daily weights. Out of bed to chair. He is on an ACE inhibitor beta-blocker and aspirin.  Elevated troponins: He denies any chest pain, cardiac biomarkers have remained flat 2D echo results are pending. Twelve-lead EKG showed no signs of ischemia. Likely demand ischemia.  Diabetes mellitus type 2: He was continued on metformin, Jardiance was added. A1c is 6.4, discontinue metformin started on sliding scale  Essential hypertension: Continue Coreg lisinopril, discontinue Norvasc. Blood pressures well controlled.  Obstructive sleep apnea: CPAP at night.  Prolonged QTC: Try to keep potassium greater than 4 magnesium greater than 2.  Morbid obesity: Counseling   DVT prophylaxis: lovenox Family Communication:none Status is: Inpatient  Remains inpatient appropriate because: Acute systolic and diastolic heart failure        Code Status:     Code Status Orders  (From admission, onward)           Start     Ordered   01/28/21 0122  Full code  Continuous        01/28/21 0126           Code Status History     Date Active Date Inactive Code Status Order ID  Comments User Context   08/31/2014 0053 09/02/2014 1942 Full Code [id number]  Dixie Dials, MD Inpatient         IV Access:   Peripheral IV   Procedures and diagnostic studies:   DG Chest Portable 1 View  Result Date: 01/27/2021 CLINICAL DATA:  COVID positive with worsening cough. EXAM: PORTABLE CHEST 1 VIEW COMPARISON:  October 31, 2020 FINDINGS: Mild atelectasis is suspected within the right lung base. The lower lung fields are limited in evaluation secondary to overlying breast soft tissue artifact. Prominence of the perihilar pulmonary vasculature is seen. There is no evidence of a pleural effusion or pneumothorax. The cardiac silhouette is markedly enlarged and unchanged in size. The visualized skeletal structures are unremarkable. IMPRESSION: 1. Mild right basilar atelectasis. 2. Stable cardiomegaly with pulmonary vascular congestion. Electronically Signed   By: Virgina Norfolk M.D.   On: 01/27/2021 20:52     Medical Consultants:   None.   Subjective:    John Conner relates he still short of breath and orthopnea  Objective:    Vitals:   01/27/21 2300 01/28/21 0041 01/28/21 0300 01/28/21 0336  BP: 110/86 (!) 122/96  123/88  Pulse: 66 82 77 78  Resp: 12 11 17 16   Temp:  98.2 F (36.8 C)  98.2 F (36.8 C)  TempSrc:  Oral  Oral  SpO2: 95% 96% 94% 95%  Weight:  (!) 169.2 kg  (!) 169.2 kg  Height:  5\' 10"  (1.778 m)  SpO2: 95 % O2 Flow Rate (L/min): 1 L/min FiO2 (%): 100 %   Intake/Output Summary (Last 24 hours) at 01/28/2021 0659 Last data filed at 01/28/2021 0300 Gross per 24 hour  Intake 509.32 ml  Output 535 ml  Net -25.68 ml   Filed Weights   01/27/21 1943 01/28/21 0041 01/28/21 0336  Weight: (!) 163.3 kg (!) 169.2 kg (!) 169.2 kg    Exam: General exam: In no acute distress. Respiratory system: Good air movement and clear to auscultation. Cardiovascular system: S1 & S2 heard, RRR. No JVD  Gastrointestinal system: Abdomen is nondistended, soft and  nontender.  Extremities: No pedal edema. Skin: No rashes, lesions or ulcers Psychiatry: Judgement and insight appear normal. Mood & affect appropriate.    Data Reviewed:    Labs: Basic Metabolic Panel: Recent Labs  Lab 01/27/21 2002  NA 138  K 3.6  CL 102  CO2 25  GLUCOSE 131*  BUN 13  CREATININE 1.21  CALCIUM 8.6*  MG 2.0   GFR Estimated Creatinine Clearance: 102.4 mL/min (by C-G formula based on SCr of 1.21 mg/dL). Liver Function Tests: Recent Labs  Lab 01/27/21 2002  AST 54*  ALT 77*  ALKPHOS 72  BILITOT 1.0  PROT 7.3  ALBUMIN 3.9   No results for input(s): LIPASE, AMYLASE in the last 168 hours. No results for input(s): AMMONIA in the last 168 hours. Coagulation profile No results for input(s): INR, PROTIME in the last 168 hours. COVID-19 Labs  No results for input(s): DDIMER, FERRITIN, LDH, CRP in the last 72 hours.  Lab Results  Component Value Date   [place] NEGATIVE 01/27/2021    CBC: Recent Labs  Lab 01/27/21 2002 01/28/21 0246  WBC 12.8* 11.7*  NEUTROABS 6.4  --   HGB 16.2 15.7  HCT 51.9 51.2  MCV 85.1 86.1  PLT 340 290   Cardiac Enzymes: No results for input(s): CKTOTAL, CKMB, CKMBINDEX, TROPONINI in the last 168 hours. BNP (last 3 results) No results for input(s): PROBNP in the last 8760 hours. CBG: Recent Labs  Lab 01/28/21 0108  GLUCAP 132*   D-Dimer: No results for input(s): DDIMER in the last 72 hours. Hgb A1c: Recent Labs    01/28/21 0246  HGBA1C 6.4*   Lipid Profile: No results for input(s): CHOL, HDL, LDLCALC, TRIG, CHOLHDL, LDLDIRECT in the last 72 hours. Thyroid function studies: Recent Labs    01/28/21 0246  TSH 1.003   Anemia work up: No results for input(s): VITAMINB12, FOLATE, FERRITIN, TIBC, IRON, RETICCTPCT in the last 72 hours. Sepsis Labs: Recent Labs  Lab 01/27/21 2002 01/28/21 0246  WBC 12.8* 11.7*  LATICACIDVEN 1.2  --    Microbiology Recent Results (from the past 240 hour(s))  Resp  Panel by RT-PCR (Flu A&B, Covid) Nasopharyngeal Swab     Status: None   Collection Time: 01/27/21  7:54 PM   Specimen: Nasopharyngeal Swab; Nasopharyngeal(NP) swabs in vial transport medium  Result Value Ref Range Status   SARS Coronavirus 2 by RT PCR NEGATIVE NEGATIVE Final    Comment: (NOTE) SARS-CoV-2 target nucleic acids are NOT DETECTED.  The SARS-CoV-2 RNA is generally detectable in upper respiratory specimens during the acute phase of infection. The lowest concentration of SARS-CoV-2 viral copies this assay can detect is 138 copies/mL. A negative result does not preclude SARS-Cov-2 infection and should not be used as the sole basis for treatment or other patient management decisions. A negative result may occur with  improper specimen collection/handling, submission of specimen other  than nasopharyngeal swab, presence of viral mutation(s) within the areas targeted by this assay, and inadequate number of viral copies(<138 copies/mL). A negative result must be combined with clinical observations, patient history, and epidemiological information. The expected result is Negative.  Fact Sheet for Patients:  EntrepreneurPulse.com.au  Fact Sheet for Healthcare Providers:  IncredibleEmployment.be  This test is no t yet approved or cleared by the [place] FDA and  has been authorized for detection and/or diagnosis of SARS-CoV-2 by FDA under an Emergency Use Authorization (EUA). This EUA will remain  in effect (meaning this test can be used) for the duration of the COVID-19 declaration under Section 564(b)(1) of the Act, 21 U.S.C.section 360bbb-3(b)(1), unless the authorization is terminated  or revoked sooner.       Influenza A by PCR NEGATIVE NEGATIVE Final   Influenza B by PCR NEGATIVE NEGATIVE Final    Comment: (NOTE) The Xpert Xpress SARS-CoV-2/FLU/RSV plus assay is intended as an aid in the diagnosis of influenza from Nasopharyngeal  swab specimens and should not be used as a sole basis for treatment. Nasal washings and aspirates are unacceptable for Xpert Xpress SARS-CoV-2/FLU/RSV testing.  Fact Sheet for Patients: EntrepreneurPulse.com.au  Fact Sheet for Healthcare Providers: IncredibleEmployment.be  This test is not yet approved or cleared by the [place] FDA and has been authorized for detection and/or diagnosis of SARS-CoV-2 by FDA under an Emergency Use Authorization (EUA). This EUA will remain in effect (meaning this test can be used) for the duration of the COVID-19 declaration under Section 564(b)(1) of the Act, 21 U.S.C. section 360bbb-3(b)(1), unless the authorization is terminated or revoked.  Performed at [facility], [address] [place], [place] [address]      Medications:    amLODipine  5 mg Oral Daily   aspirin EC  81 mg Oral Daily   atorvastatin  40 mg Oral Daily   carvedilol  12.5 mg Oral BID WC   chlorhexidine  15 mL Mouth Rinse BID   empagliflozin  10 mg Oral Daily   enoxaparin (LOVENOX) injection  40 mg Subcutaneous Q24H   furosemide  40 mg Intravenous Q12H   hydrochlorothiazide  25 mg Oral Daily   insulin aspart  0-9 Units Subcutaneous TID WC & HS   lisinopril  40 mg Oral Daily   Living Better with Heart Failure Book   Does not apply Once   metFORMIN  500 mg Oral BID WC   potassium chloride  20 mEq Oral Daily   sodium chloride flush  3 mL Intravenous Q12H   Continuous Infusions:  sodium chloride        LOS: 0 days   Charlynne Cousins  [facility]  01/28/2021, 6:59 AM
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
## 2021-01-28 NOTE — Progress Notes (Addendum)
°  Echocardiogram 2D Echocardiogram with contrast has been performed.  Merrie Roof F 01/28/2021, 10:37 AM

## 2021-01-28 NOTE — Social Work (Addendum)
CSW acknowledges consult for SNF/HH. The patient will require PT/OT evaluations. TOC will assist with disposition planning once the evaluations have been completed.  °  °TOC will continue to follow.    °

## 2021-01-29 DIAGNOSIS — I10 Essential (primary) hypertension: Secondary | ICD-10-CM | POA: Diagnosis not present

## 2021-01-29 DIAGNOSIS — I5033 Acute on chronic diastolic (congestive) heart failure: Secondary | ICD-10-CM | POA: Diagnosis not present

## 2021-01-29 DIAGNOSIS — E1169 Type 2 diabetes mellitus with other specified complication: Secondary | ICD-10-CM | POA: Diagnosis not present

## 2021-01-29 DIAGNOSIS — I509 Heart failure, unspecified: Secondary | ICD-10-CM | POA: Diagnosis not present

## 2021-01-29 LAB — GLUCOSE, CAPILLARY
Glucose-Capillary: 135 mg/dL — ABNORMAL HIGH (ref 70–99)
Glucose-Capillary: 109 mg/dL — ABNORMAL HIGH (ref 70–99)
Glucose-Capillary: 146 mg/dL — ABNORMAL HIGH (ref 70–99)
Glucose-Capillary: 117 mg/dL — ABNORMAL HIGH (ref 70–99)

## 2021-01-29 LAB — BASIC METABOLIC PANEL
Anion gap: 10 (ref 5–15)
BUN: 11 mg/dL (ref 6–20)
CO2: 27 mmol/L (ref 22–32)
Calcium: 8.6 mg/dL — ABNORMAL LOW (ref 8.9–10.3)
Chloride: 100 mmol/L (ref 98–111)
Creatinine, Ser: 1.16 mg/dL (ref 0.61–1.24)
GFR, Estimated: >60 mL/min (ref >60)
Glucose, Bld: 91 mg/dL (ref 70–99)
Potassium: 4.1 mmol/L (ref 3.5–5.1)
Sodium: 137 mmol/L (ref 135–145)

## 2021-01-29 MED ORDER — FUROSEMIDE 10 MG/ML IJ SOLN
80.0000 mg | Freq: Two times a day (BID) | INTRAMUSCULAR | Status: DC
  Administered 2021-01-29: 80 mg via INTRAVENOUS
  Administered 2021-01-29: 40 mg via INTRAVENOUS
  Administered 2021-01-30 – 2021-01-31 (×3): 80 mg via INTRAVENOUS
  Filled 2021-01-29 (×5): qty 8

## 2021-01-29 NOTE — Progress Notes (Signed)
Mobility Specialist Progress Note    01/29/21 1408  Mobility  Activity Ambulated in hall  Level of Assistance Standby assist, set-up cues, supervision of patient - no hands on  Assistive Device Front wheel walker  Distance Ambulated (ft) 250 ft  Mobility Ambulated with assistance in hallway  Mobility Response Tolerated fair  Mobility performed by Mobility specialist  Bed Position Chair  $Mobility charge 1 Mobility   Pt received sitting EOB and agreeable. Pt was DOE but could not get reliable pleth reading. Encouraged pursed lip breathing and took x1 short standing rest break. Upon return to chair SpO2 read 91%. Left with call bell in reach.   [facility] Mobility Specialist  M.S. Primary Phone: [phone] M.S. Secondary Phone: [phone]

## 2021-01-29 NOTE — Evaluation (Signed)
Physical Therapy Evaluation Patient Details Name: John Conner MRN: [id number] DOB: 1960-11-08 Today's Date: 01/29/2021  History of Present Illness  61 yo male [facility] on 12/31 with acute on chronic HF, out of medications x3 days. abnormal EKG with elevated troponins, trending troponins but attributed to likely demand ischemia. Pt with recent history of covid, now testing negative. PMH includes HLD, HTN, obesity, OSA on CPAP, carpal tunnel.  Clinical Impression   Pt presents with distal LE edema, dyspnea on exertion with SPO82min 85% on RA (recovers to 90s% on RA post-gait), impaired activity tolerance vs baseline. Pt to benefit from acute PT to address deficits. Pt ambulated good hallway distance, ambulatory with and without AD but prefers RW at this time. PT anticipates pt will return to PLOF quickly, no PT follow up anticipated. PT to progress mobility as tolerated, and will continue to follow acutely.         Recommendations for follow up therapy are one component of a multi-disciplinary discharge planning process, led by the attending physician.  Recommendations may be updated based on patient status, additional functional criteria and insurance authorization.  Follow Up Recommendations No PT follow up    Assistance Recommended at Discharge Set up Supervision/Assistance  Functional Status Assessment Patient has had a recent decline in their functional status and demonstrates the ability to make significant improvements in function in a reasonable and predictable amount of time.  Equipment Recommendations  None recommended by PT    Recommendations for Other Services       Precautions / Restrictions Precautions Precautions: Fall Restrictions Weight Bearing Restrictions: No      Mobility  Bed Mobility               General bed mobility comments: up in chair    Transfers Overall transfer level: Needs assistance Equipment used: None Transfers: Sit to/from  Stand Sit to Stand: Supervision           General transfer comment: for safety, no physical assist. PT managing lines/leads    Ambulation/Gait Ambulation/Gait assistance: Supervision Gait Distance (Feet): 200 Feet Assistive device: Rolling walker (2 wheels);None Gait Pattern/deviations: Step-through pattern;Decreased stride length;Trunk flexed Gait velocity: decr     General Gait Details: for safety, initially ambulatory without AD but requests RW to steady self. Cues for self-regulating distance given fatiguability, RW use. SPO2 85% on RA during gait, with recovery to 92% on RA at rest with cues for breathing technique  Stairs            Wheelchair Mobility    Modified Rankin (Stroke Patients Only)       Balance Overall balance assessment: Needs assistance;History of Falls Sitting-balance support: No upper extremity supported;Feet unsupported Sitting balance-Leahy Scale: Good     Standing balance support: During functional activity;No upper extremity supported Standing balance-Leahy Scale: Fair                               Pertinent Vitals/Pain Pain Assessment: Faces Faces Pain Scale: Hurts a little bit Pain Location: congested Pain Intervention(s): Limited activity within patient's tolerance;Monitored during session;Repositioned    Home Living Family/patient expects to be discharged to:: Private residence Living Arrangements: Spouse/significant other Available Help at Discharge: Family;Available PRN/intermittently Type of Home: House Home Access: Stairs to enter   [employer] of Steps: 2 +3 from the front, 1 or 2 from back door (preference for back door)   Home Layout: One level Home  Equipment: Marine scientist - single point      Prior Function Prior Level of Function : Independent/Modified Independent             Mobility Comments: pt using cane PTA ADLs Comments: pt reports independence with ADLs, was living with mom  but moving back in with wife at d/c. Pt was working at [employer], now states he is on disability     Hand Dominance   Dominant Hand: Left    Extremity/Trunk Assessment   Upper Extremity Assessment Upper Extremity Assessment: Defer to OT evaluation    Lower Extremity Assessment Lower Extremity Assessment: Overall WFL for tasks assessed (bilat edema, ted hose donned bilat and no pitting noted)    Cervical / Trunk Assessment Cervical / Trunk Assessment: Normal  Communication   Communication: No difficulties  Cognition Arousal/Alertness: Awake/alert Behavior During Therapy: WFL for tasks assessed/performed Overall Cognitive Status: Within Functional Limits for tasks assessed                                          General Comments      Exercises     Assessment/Plan    PT Assessment Patient needs continued PT services  PT Problem List Decreased mobility;Decreased safety awareness;Decreased balance;Decreased knowledge of use of DME;Decreased activity tolerance;Cardiopulmonary status limiting activity       PT Treatment Interventions DME instruction;Therapeutic activities;Gait training;Therapeutic exercise;Patient/family education;Balance training;Stair training;Functional mobility training;Neuromuscular re-education    PT Goals (Current goals can be found in the Care Plan section)  Acute Rehab PT Goals Patient Stated Goal: home PT Goal Formulation: With patient Time For Goal Achievement: 02/12/21 Potential to Achieve Goals: Good    Frequency Min 3X/week   Barriers to discharge        Co-evaluation               AM-PAC PT "6 Clicks" Mobility  Outcome Measure Help needed turning from your back to your side while in a flat bed without using bedrails?: None Help needed moving from lying on your back to sitting on the side of a flat bed without using bedrails?: A Little Help needed moving to and from a bed to a chair (including a wheelchair)?: A  Little Help needed standing up from a chair using your arms (e.g., wheelchair or bedside chair)?: A Little Help needed to walk in hospital room?: A Little Help needed climbing 3-5 steps with a railing? : A Little 6 Click Score: 19    End of Session   Activity Tolerance: Patient tolerated treatment well Patient left: in bed;with call bell/phone within reach;with family/visitor present Nurse Communication: Mobility status PT Visit Diagnosis: Other abnormalities of gait and mobility (R26.89)    Time: 8850-2774 PT Time Calculation (min) (ACUTE ONLY): 21 min   Charges:   PT Evaluation $PT Eval Low Complexity: 1 Low        Anet Logsdon S, PT DPT Acute Rehabilitation Services Pager [phone]  Office [phone]   [place] E Ruffin Pyo 01/29/2021, 10:25 AM

## 2021-01-29 NOTE — Progress Notes (Signed)
Pt stated he will place self on CPAP when he is ready. 2L of 02 bleed in.

## 2021-01-29 NOTE — Progress Notes (Signed)
[facility] PROGRESS NOTE    Progress Note  John Conner  WLN:[id number] DOB: 1960/04/09 DOA: 01/27/2021 PCP: Jonathon Resides, MD     Brief Narrative:   John Conner is an 61 y.o. male past medical history significant for essential hypertension, diabetes mellitus type 2, chronic systolic heart failure with a last EF of 40% in 2016, morbid obesity comes in for progressive shortness of breath over the last week, in the ED was found to be hypoxic started on nitroglycerin initial troponin 0 129, BNP was 700 white count of 12,800, chest x-ray showed vascular congestion    Assessment/Plan:   Acute respiratory failure with hypoxia secondary to acute on chronic combined systolic and diastolic heart failure  2D echo is pending. Increase Lasix to 80 mg twice a day continue replete his potassium has needed. Continue to monitor strict I's and O's and daily weight thiss. Out of bed to chair. Continue beta-blocker, will hold ACE inhibitor and try to get him on Entresto in 48 hours.  Elevated troponins: He denies any chest pain, cardiac biomarkers have remained flat 2D echo results are pending. Twelve-lead EKG showed no signs of ischemia. Likely demand ischemia.  Diabetes mellitus type 2: He was continued on metformin, Jardiance was added. A1c is 6.4, discontinue metformin started on sliding scale  Essential hypertension: Continue Coreg  Blood pressures well controlled.  Obstructive sleep apnea: CPAP at night.  Prolonged QTC: Try to keep potassium greater than 4 magnesium greater than 2.  Morbid obesity: Counseling   DVT prophylaxis: lovenox Family Communication:none Status is: Inpatient  Remains inpatient appropriate because: Acute systolic and diastolic heart failure   Code Status:     Code Status Orders  (From admission, onward)           Start     Ordered   01/28/21 0122  Full code  Continuous        01/28/21 0126           Code Status History      Date Active Date Inactive Code Status Order ID Comments User Context   08/31/2014 0053 09/02/2014 1942 Full Code [id number]  Dixie Dials, MD Inpatient         IV Access:   Peripheral IV   Procedures and diagnostic studies:   DG Chest Portable 1 View  Result Date: 01/27/2021 CLINICAL DATA:  COVID positive with worsening cough. EXAM: PORTABLE CHEST 1 VIEW COMPARISON:  October 31, 2020 FINDINGS: Mild atelectasis is suspected within the right lung base. The lower lung fields are limited in evaluation secondary to overlying breast soft tissue artifact. Prominence of the perihilar pulmonary vasculature is seen. There is no evidence of a pleural effusion or pneumothorax. The cardiac silhouette is markedly enlarged and unchanged in size. The visualized skeletal structures are unremarkable. IMPRESSION: 1. Mild right basilar atelectasis. 2. Stable cardiomegaly with pulmonary vascular congestion. Electronically Signed   By: Virgina Norfolk M.D.   On: 01/27/2021 20:52     Medical Consultants:   None.   Subjective:    John Conner relates that shortness of breath is better but still orthopneic  Objective:    Vitals:   01/28/21 2100 01/28/21 2331 01/29/21 0617 01/29/21 0758  BP: (!) 130/91  98/80   Pulse: 68 60 76 71  Resp: 20 18 18    Temp: 97.7 F (36.5 C)  97.6 F (36.4 C)   TempSrc: Oral  Oral   SpO2: 95% 96% 95% 93%  Weight:   (!) 163.5 kg  Height:       SpO2: 93 % O2 Flow Rate (L/min): 3 L/min FiO2 (%): 100 %   Intake/Output Summary (Last 24 hours) at 01/29/2021 4854 Last data filed at 01/29/2021 0036 Gross per 24 hour  Intake 963 ml  Output 2525 ml  Net -1562 ml    Filed Weights   01/28/21 0041 01/28/21 0336 01/29/21 0617  Weight: (!) 169.2 kg (!) 169.2 kg (!) 163.5 kg    Exam: General exam: In no acute distress. Respiratory system: Good air movement and clear to auscultation. Cardiovascular system: S1 & S2 heard, RRR. No JVD. Gastrointestinal system:  Abdomen is nondistended, soft and nontender.  Extremities: No pedal edema. Skin: No rashes, lesions or ulcers Psychiatry: Judgement and insight appear normal. Mood & affect appropriate.   Data Reviewed:    Labs: Basic Metabolic Panel: Recent Labs  Lab 01/27/21 2002 01/29/21 0415  NA 138 137  K 3.6 4.1  CL 102 100  CO2 25 27  GLUCOSE 131* 91  BUN 13 11  CREATININE 1.21 1.16  CALCIUM 8.6* 8.6*  MG 2.0  --     GFR Estimated Creatinine Clearance: 104.6 mL/min (by C-G formula based on SCr of 1.16 mg/dL). Liver Function Tests: Recent Labs  Lab 01/27/21 2002  AST 54*  ALT 77*  ALKPHOS 72  BILITOT 1.0  PROT 7.3  ALBUMIN 3.9    No results for input(s): LIPASE, AMYLASE in the last 168 hours. No results for input(s): AMMONIA in the last 168 hours. Coagulation profile No results for input(s): INR, PROTIME in the last 168 hours. COVID-19 Labs  No results for input(s): DDIMER, FERRITIN, LDH, CRP in the last 72 hours.  Lab Results  Component Value Date   [place] NEGATIVE 01/27/2021    CBC: Recent Labs  Lab 01/27/21 2002 01/28/21 0246  WBC 12.8* 11.7*  NEUTROABS 6.4  --   HGB 16.2 15.7  HCT 51.9 51.2  MCV 85.1 86.1  PLT 340 290    Cardiac Enzymes: No results for input(s): CKTOTAL, CKMB, CKMBINDEX, TROPONINI in the last 168 hours. BNP (last 3 results) No results for input(s): PROBNP in the last 8760 hours. CBG: Recent Labs  Lab 01/28/21 0742 01/28/21 1145 01/28/21 1555 01/28/21 2105 01/29/21 0807  GLUCAP 120* 105* 103* 110* 135*    D-Dimer: No results for input(s): DDIMER in the last 72 hours. Hgb A1c: Recent Labs    01/28/21 0246  HGBA1C 6.4*    Lipid Profile: No results for input(s): CHOL, HDL, LDLCALC, TRIG, CHOLHDL, LDLDIRECT in the last 72 hours. Thyroid function studies: Recent Labs    01/28/21 0246  TSH 1.003    Anemia work up: No results for input(s): VITAMINB12, FOLATE, FERRITIN, TIBC, IRON, RETICCTPCT in the last 72  hours. Sepsis Labs: Recent Labs  Lab 01/27/21 2002 01/28/21 0246  WBC 12.8* 11.7*  LATICACIDVEN 1.2  --     Microbiology Recent Results (from the past 240 hour(s))  Resp Panel by RT-PCR (Flu A&B, Covid) Nasopharyngeal Swab     Status: None   Collection Time: 01/27/21  7:54 PM   Specimen: Nasopharyngeal Swab; Nasopharyngeal(NP) swabs in vial transport medium  Result Value Ref Range Status   SARS Coronavirus 2 by RT PCR NEGATIVE NEGATIVE Final    Comment: (NOTE) SARS-CoV-2 target nucleic acids are NOT DETECTED.  The SARS-CoV-2 RNA is generally detectable in upper respiratory specimens during the acute phase of infection. The lowest concentration of SARS-CoV-2 viral copies this assay can detect is 138 copies/mL.  A negative result does not preclude SARS-Cov-2 infection and should not be used as the sole basis for treatment or other patient management decisions. A negative result may occur with  improper specimen collection/handling, submission of specimen other than nasopharyngeal swab, presence of viral mutation(s) within the areas targeted by this assay, and inadequate number of viral copies(<138 copies/mL). A negative result must be combined with clinical observations, patient history, and epidemiological information. The expected result is Negative.  Fact Sheet for Patients:  EntrepreneurPulse.com.au  Fact Sheet for Healthcare Providers:  IncredibleEmployment.be  This test is no t yet approved or cleared by the [place] FDA and  has been authorized for detection and/or diagnosis of SARS-CoV-2 by FDA under an Emergency Use Authorization (EUA). This EUA will remain  in effect (meaning this test can be used) for the duration of the COVID-19 declaration under Section 564(b)(1) of the Act, 21 U.S.C.section 360bbb-3(b)(1), unless the authorization is terminated  or revoked sooner.       Influenza A by PCR NEGATIVE NEGATIVE Final    Influenza B by PCR NEGATIVE NEGATIVE Final    Comment: (NOTE) The Xpert Xpress SARS-CoV-2/FLU/RSV plus assay is intended as an aid in the diagnosis of influenza from Nasopharyngeal swab specimens and should not be used as a sole basis for treatment. Nasal washings and aspirates are unacceptable for Xpert Xpress SARS-CoV-2/FLU/RSV testing.  Fact Sheet for Patients: EntrepreneurPulse.com.au  Fact Sheet for Healthcare Providers: IncredibleEmployment.be  This test is not yet approved or cleared by the [place] FDA and has been authorized for detection and/or diagnosis of SARS-CoV-2 by FDA under an Emergency Use Authorization (EUA). This EUA will remain in effect (meaning this test can be used) for the duration of the COVID-19 declaration under Section 564(b)(1) of the Act, 21 U.S.C. section 360bbb-3(b)(1), unless the authorization is terminated or revoked.  Performed at [facility], [address] [place], [place] [address]      Medications:    aspirin EC  81 mg Oral Daily   atorvastatin  40 mg Oral Daily   carvedilol  6.25 mg Oral BID WC   chlorhexidine  15 mL Mouth Rinse BID   empagliflozin  10 mg Oral Daily   enoxaparin (LOVENOX) injection  85 mg Subcutaneous Q24H   influenza vac split quadrivalent PF  0.5 mL Intramuscular Tomorrow-1000   insulin aspart  0-15 Units Subcutaneous TID WC   insulin aspart  0-5 Units Subcutaneous QHS   insulin aspart  6 Units Subcutaneous TID WC   losartan  25 mg Oral Daily   potassium chloride  20 mEq Oral Daily   sodium chloride flush  3 mL Intravenous Q12H   Continuous Infusions:  sodium chloride        LOS: 1 day   Charlynne Cousins  [facility]  01/29/2021, 8:32 AM

## 2021-01-30 DIAGNOSIS — I5033 Acute on chronic diastolic (congestive) heart failure: Secondary | ICD-10-CM | POA: Diagnosis not present

## 2021-01-30 DIAGNOSIS — I10 Essential (primary) hypertension: Secondary | ICD-10-CM | POA: Diagnosis not present

## 2021-01-30 DIAGNOSIS — I214 Non-ST elevation (NSTEMI) myocardial infarction: Secondary | ICD-10-CM

## 2021-01-30 DIAGNOSIS — I509 Heart failure, unspecified: Secondary | ICD-10-CM | POA: Diagnosis not present

## 2021-01-30 DIAGNOSIS — E1169 Type 2 diabetes mellitus with other specified complication: Secondary | ICD-10-CM | POA: Diagnosis not present

## 2021-01-30 LAB — BASIC METABOLIC PANEL
Anion gap: 12 (ref 5–15)
BUN: 15 mg/dL (ref 6–20)
CO2: 29 mmol/L (ref 22–32)
Calcium: 8.6 mg/dL — ABNORMAL LOW (ref 8.9–10.3)
Chloride: 97 mmol/L — ABNORMAL LOW (ref 98–111)
Creatinine, Ser: 1.22 mg/dL (ref 0.61–1.24)
GFR, Estimated: >60 mL/min (ref >60)
Glucose, Bld: 97 mg/dL (ref 70–99)
Potassium: 3.7 mmol/L (ref 3.5–5.1)
Sodium: 138 mmol/L (ref 135–145)

## 2021-01-30 LAB — ECHOCARDIOGRAM COMPLETE
Area-P 1/2: 4.31 cm2
Calc EF: 32.2 %
Height: 70 in
S' Lateral: 4.3 cm
Single Plane A2C EF: 31.3 %
Single Plane A4C EF: 30.2 %
Weight: 5968 oz

## 2021-01-30 LAB — GLUCOSE, CAPILLARY
Glucose-Capillary: 128 mg/dL — ABNORMAL HIGH (ref 70–99)
Glucose-Capillary: 92 mg/dL (ref 70–99)
Glucose-Capillary: 129 mg/dL — ABNORMAL HIGH (ref 70–99)
Glucose-Capillary: 106 mg/dL — ABNORMAL HIGH (ref 70–99)

## 2021-01-30 LAB — RESP PANEL BY RT-PCR (FLU A&B, COVID) ARPGX2
Influenza A by PCR: NEGATIVE
Influenza B by PCR: NEGATIVE
SARS Coronavirus 2 by RT PCR: NEGATIVE

## 2021-01-30 MED ORDER — SACUBITRIL-VALSARTAN 24-26 MG PO TABS
1.0000 | ORAL_TABLET | Freq: Two times a day (BID) | ORAL | Status: DC
  Administered 2021-01-31 – 2021-02-07 (×15): 1 via ORAL
  Filled 2021-01-30 (×15): qty 1

## 2021-01-30 MED ORDER — POTASSIUM CHLORIDE CRYS ER 20 MEQ PO TBCR
40.0000 meq | EXTENDED_RELEASE_TABLET | Freq: Two times a day (BID) | ORAL | Status: AC
  Administered 2021-01-30 (×2): 40 meq via ORAL
  Filled 2021-01-30 (×2): qty 2

## 2021-01-30 NOTE — Progress Notes (Signed)
Continues to have [facility] PROGRESS NOTE    Progress Note  John Conner  YIF:[id number] DOB: 01-Sep-1960 DOA: 01/27/2021 PCP: Jonathon Resides, MD     Brief Narrative:   John Conner is an 61 y.o. male past medical history significant for essential hypertension, diabetes mellitus type 2, chronic systolic heart failure with a last EF of 40% in 2016, morbid obesity comes in for progressive shortness of breath over the last week, in the ED was found to be hypoxic started on nitroglycerin initial troponin 0 129, BNP was 700 white count of 12,800, chest x-ray showed vascular congestion    Assessment/Plan:   Acute respiratory failure with hypoxia secondary to acute on chronic combined systolic and diastolic heart failure  2D echo is pending. Good diuresis on IV Lasix continue current dose, monitor electrolytes and replete as needed. Continue monitor strict I's and O's and daily weight. Out of bed to chair, consult physical therapy. Continue beta-blocker as blood pressure is holding steady, will start low-dose Entresto tomorrow. TED hose. Weaned to room air  Elevated troponins: He denies any chest pain, cardiac biomarkers have remained flat 2D echo results are pending. Twelve-lead EKG showed no signs of ischemia. Likely demand ischemia.  Diabetes mellitus type 2: He was continued on metformin, Jardiance was added. A1c is 6.4, discontinue metformin started on sliding scale  Essential hypertension: Continue Coreg, blood pressure has remained stable we will start Entresto tomorrow. Blood pressures well controlled.  Obstructive sleep apnea: CPAP at night.  Prolonged QTC: Try to keep potassium greater than 4 magnesium greater than 2.  Morbid obesity: Counseling   DVT prophylaxis: lovenox Family Communication:none Status is: Inpatient  Remains inpatient appropriate because: Acute systolic and diastolic heart failure   Code Status:     Code Status Orders  (From  admission, onward)           Start     Ordered   01/28/21 0122  Full code  Continuous        01/28/21 0126           Code Status History     Date Active Date Inactive Code Status Order ID Comments User Context   08/31/2014 0053 09/02/2014 1942 Full Code [id number]  Dixie Dials, MD Inpatient         IV Access:   Peripheral IV   Procedures and diagnostic studies:   No results found.   Medical Consultants:   None.   Subjective:    John Conner orthopnea resolved he is no longer short of breath.  Objective:    Vitals:   01/29/21 1748 01/29/21 2114 01/30/21 0604 01/30/21 0812  BP: (!) 112/92 108/82 (!) 121/104 (!) 113/96  Pulse: 71 60 74 82  Resp:      Temp:  97.9 F (36.6 C) 97.9 F (36.6 C)   TempSrc:  Oral Oral   SpO2:  92% 94%   Weight:   (!) 162.7 kg   Height:       SpO2: 94 % O2 Flow Rate (L/min): 3 L/min FiO2 (%): 100 %   Intake/Output Summary (Last 24 hours) at 01/30/2021 7096 Last data filed at 01/30/2021 0800 Gross per 24 hour  Intake --  Output 3350 ml  Net -3350 ml    Filed Weights   01/28/21 0336 01/29/21 0617 01/30/21 0604  Weight: (!) 169.2 kg (!) 163.5 kg (!) 162.7 kg    Exam: General exam: In no acute distress. Respiratory system: Good air movement and clear to  auscultation. Cardiovascular system: S1 & S2 heard, RRR. No JVD. Gastrointestinal system: Abdomen is nondistended, soft and nontender.  Extremities: 2+ edema. Skin: No rashes, lesions or ulcers Psychiatry: Judgement and insight appear normal. Mood & affect appropriate.   Data Reviewed:    Labs: Basic Metabolic Panel: Recent Labs  Lab 01/27/21 2002 01/29/21 0415 01/30/21 0337  NA 138 137 138  K 3.6 4.1 3.7  CL 102 100 97*  CO2 25 27 29   GLUCOSE 131* 91 97  BUN 13 11 15   CREATININE 1.21 1.16 1.22  CALCIUM 8.6* 8.6* 8.6*  MG 2.0  --   --     GFR Estimated Creatinine Clearance: 99.2 mL/min (by C-G formula based on SCr of 1.22 mg/dL). Liver  Function Tests: Recent Labs  Lab 01/27/21 2002  AST 54*  ALT 77*  ALKPHOS 72  BILITOT 1.0  PROT 7.3  ALBUMIN 3.9    No results for input(s): LIPASE, AMYLASE in the last 168 hours. No results for input(s): AMMONIA in the last 168 hours. Coagulation profile No results for input(s): INR, PROTIME in the last 168 hours. COVID-19 Labs  No results for input(s): DDIMER, FERRITIN, LDH, CRP in the last 72 hours.  Lab Results  Component Value Date   [place] NEGATIVE 01/27/2021    CBC: Recent Labs  Lab 01/27/21 2002 01/28/21 0246  WBC 12.8* 11.7*  NEUTROABS 6.4  --   HGB 16.2 15.7  HCT 51.9 51.2  MCV 85.1 86.1  PLT 340 290    Cardiac Enzymes: No results for input(s): CKTOTAL, CKMB, CKMBINDEX, TROPONINI in the last 168 hours. BNP (last 3 results) No results for input(s): PROBNP in the last 8760 hours. CBG: Recent Labs  Lab 01/29/21 0807 01/29/21 1121 01/29/21 1613 01/29/21 1956 01/30/21 0723  GLUCAP 135* 109* 146* 117* 128*    D-Dimer: No results for input(s): DDIMER in the last 72 hours. Hgb A1c: Recent Labs    01/28/21 0246  HGBA1C 6.4*    Lipid Profile: No results for input(s): CHOL, HDL, LDLCALC, TRIG, CHOLHDL, LDLDIRECT in the last 72 hours. Thyroid function studies: Recent Labs    01/28/21 0246  TSH 1.003    Anemia work up: No results for input(s): VITAMINB12, FOLATE, FERRITIN, TIBC, IRON, RETICCTPCT in the last 72 hours. Sepsis Labs: Recent Labs  Lab 01/27/21 2002 01/28/21 0246  WBC 12.8* 11.7*  LATICACIDVEN 1.2  --     Microbiology Recent Results (from the past 240 hour(s))  Resp Panel by RT-PCR (Flu A&B, Covid) Nasopharyngeal Swab     Status: None   Collection Time: 01/27/21  7:54 PM   Specimen: Nasopharyngeal Swab; Nasopharyngeal(NP) swabs in vial transport medium  Result Value Ref Range Status   SARS Coronavirus 2 by RT PCR NEGATIVE NEGATIVE Final    Comment: (NOTE) SARS-CoV-2 target nucleic acids are NOT DETECTED.  The  SARS-CoV-2 RNA is generally detectable in upper respiratory specimens during the acute phase of infection. The lowest concentration of SARS-CoV-2 viral copies this assay can detect is 138 copies/mL. A negative result does not preclude SARS-Cov-2 infection and should not be used as the sole basis for treatment or other patient management decisions. A negative result may occur with  improper specimen collection/handling, submission of specimen other than nasopharyngeal swab, presence of viral mutation(s) within the areas targeted by this assay, and inadequate number of viral copies(<138 copies/mL). A negative result must be combined with clinical observations, patient history, and epidemiological information. The expected result is Negative.  Fact Sheet for  Patients:  EntrepreneurPulse.com.au  Fact Sheet for Healthcare Providers:  IncredibleEmployment.be  This test is no t yet approved or cleared by the [place] FDA and  has been authorized for detection and/or diagnosis of SARS-CoV-2 by FDA under an Emergency Use Authorization (EUA). This EUA will remain  in effect (meaning this test can be used) for the duration of the COVID-19 declaration under Section 564(b)(1) of the Act, 21 U.S.C.section 360bbb-3(b)(1), unless the authorization is terminated  or revoked sooner.       Influenza A by PCR NEGATIVE NEGATIVE Final   Influenza B by PCR NEGATIVE NEGATIVE Final    Comment: (NOTE) The Xpert Xpress SARS-CoV-2/FLU/RSV plus assay is intended as an aid in the diagnosis of influenza from Nasopharyngeal swab specimens and should not be used as a sole basis for treatment. Nasal washings and aspirates are unacceptable for Xpert Xpress SARS-CoV-2/FLU/RSV testing.  Fact Sheet for Patients: EntrepreneurPulse.com.au  Fact Sheet for Healthcare Providers: IncredibleEmployment.be  This test is not yet approved or  cleared by the [place] FDA and has been authorized for detection and/or diagnosis of SARS-CoV-2 by FDA under an Emergency Use Authorization (EUA). This EUA will remain in effect (meaning this test can be used) for the duration of the COVID-19 declaration under Section 564(b)(1) of the Act, 21 U.S.C. section 360bbb-3(b)(1), unless the authorization is terminated or revoked.  Performed at [facility], [address] [place], [place] [address]      Medications:    aspirin EC  81 mg Oral Daily   atorvastatin  40 mg Oral Daily   carvedilol  6.25 mg Oral BID WC   chlorhexidine  15 mL Mouth Rinse BID   empagliflozin  10 mg Oral Daily   enoxaparin (LOVENOX) injection  85 mg Subcutaneous Q24H   furosemide  80 mg Intravenous Q12H   influenza vac split quadrivalent PF  0.5 mL Intramuscular Tomorrow-1000   insulin aspart  0-15 Units Subcutaneous TID WC   insulin aspart  0-5 Units Subcutaneous QHS   insulin aspart  6 Units Subcutaneous TID WC   potassium chloride  20 mEq Oral Daily   sodium chloride flush  3 mL Intravenous Q12H   Continuous Infusions:  sodium chloride        LOS: 2 days   Charlynne Cousins  [facility]  01/30/2021, 9:38 AM

## 2021-01-30 NOTE — Progress Notes (Signed)
Physical Therapy Treatment Patient Details Name: Jeran Hiltz MRN: [id number] DOB: 1960/03/27 Today's Date: 01/30/2021   History of Present Illness 61 yo male presents to [facility] on 12/31 with acute on chronic HF, out of medications x3 days. abnormal EKG with elevated troponins, trending troponins but attributed to likely demand ischemia. Pt with recent history of covid, now testing negative. PMH includes HLD, HTN, obesity, OSA on CPAP, carpal tunnel.    PT Comments    Patient making steady progress with mobility and ambulated functional distance in hallway of ~160' with supervision and use of RW. Pt is progressing towards baseline level of independence. He was instructed on exercises for LE strengthening and to promote LE circulation given ongoing LE edema. Pt demonstrated exercises and educated on frequency. Anticipate he will continue to progress mobility with no therapy needs when medically ready for discharge home. Acute PT will continue to progress during his stay.    Recommendations for follow up therapy are one component of a multi-disciplinary discharge planning process, led by the attending physician.  Recommendations may be updated based on patient status, additional functional criteria and insurance authorization.  Follow Up Recommendations  No PT follow up     Assistance Recommended at Discharge Set up Supervision/Assistance  Patient can return home with the following     Equipment Recommendations  None recommended by PT    Recommendations for Other Services       Precautions / Restrictions Precautions Precautions: Fall Restrictions Weight Bearing Restrictions: No     Mobility  Bed Mobility               General bed mobility comments: OOB in recliner    Transfers Overall transfer level: Needs assistance Equipment used: None Transfers: Sit to/from Stand Sit to Stand: Supervision           General transfer comment: supervision for safety, pt steady with  rise.    Ambulation/Gait Ambulation/Gait assistance: Supervision Gait Distance (Feet): 160 Feet Assistive device: Rolling walker (2 wheels);None Gait Pattern/deviations: Step-through pattern;Decreased stride length;Trunk flexed Gait velocity: fair     General Gait Details: sueprvision for safety. pt ambulating short distance in room, no device, slightly unsteady reaching for bed rail for support. pt prefers use of RW for increased gait distance. Amb functional distance in hall, limited by DOE (3/4 on scale). HR stable in 90's during gait, SpO2 >92% on RA.   Stairs             Wheelchair Mobility    Modified Rankin (Stroke Patients Only)       Balance Overall balance assessment: Needs assistance;History of Falls Sitting-balance support: No upper extremity supported;Feet unsupported Sitting balance-Leahy Scale: Good     Standing balance support: During functional activity;No upper extremity supported Standing balance-Leahy Scale: Fair                              Cognition Arousal/Alertness: Awake/alert Behavior During Therapy: WFL for tasks assessed/performed Overall Cognitive Status: Within Functional Limits for tasks assessed                                          Exercises Other Exercises Other Exercises: 10 reps sit<>stand (no UE use) pt required rest break after 4 reps. Other Exercises: 10 reps bil LE heel raises with UE support at window  General Comments        Pertinent Vitals/Pain Pain Assessment: 0-10 Pain Score: 7  Pain Location: Lt knee pain Pain Descriptors / Indicators: Aching Pain Intervention(s): Limited activity within patient's tolerance;Monitored during session    Home Living                          Prior Function            PT Goals (current goals can now be found in the care plan section) Acute Rehab PT Goals Patient Stated Goal: home PT Goal Formulation: With patient Time For Goal  Achievement: 02/12/21 Potential to Achieve Goals: Good Progress towards PT goals: Progressing toward goals    Frequency    Min 3X/week      PT Plan Current plan remains appropriate    Co-evaluation              AM-PAC PT "6 Clicks" Mobility   Outcome Measure  Help needed turning from your back to your side while in a flat bed without using bedrails?: None Help needed moving from lying on your back to sitting on the side of a flat bed without using bedrails?: None Help needed moving to and from a bed to a chair (including a wheelchair)?: None Help needed standing up from a chair using your arms (e.g., wheelchair or bedside chair)?: None Help needed to walk in hospital room?: A Little Help needed climbing 3-5 steps with a railing? : A Little 6 Click Score: 22    End of Session Equipment Utilized During Treatment: Gait belt Activity Tolerance: Patient tolerated treatment well Patient left: with call bell/phone within reach;in chair Nurse Communication: Mobility status PT Visit Diagnosis: Other abnormalities of gait and mobility (R26.89)     Time: 3557-3220 PT Time Calculation (min) (ACUTE ONLY): 17 min  Charges:  $Therapeutic Exercise: 8-22 mins                     Verner Mould, DPT Acute Rehabilitation Services Office [phone] Pager [phone]   Jacques Navy 01/30/2021, 12:39 PM

## 2021-01-30 NOTE — Care Management (Signed)
0992 01-30-21 Case Manager spoke with the patient regarding medication assistance and PCP consult. Patient states he was going to the [facility] in [facility] and he wants a new PCP. Patient has [facility] insurance for the new year. Case Manager advised the patient to call the 1-800 number on the back of the insurance card for a new provide in network with his insurance and to schedule a visit. Patient uses [facility] on [address] in [place]. Case Manager will be unable to assist the patient with medications since he is insured. No further needs from Case Manager at this time.
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
## 2021-01-30 NOTE — Progress Notes (Signed)
Mobility Specialist Progress Note    01/30/21 1512  Mobility  Activity Ambulated in hall  Level of Assistance Standby assist, set-up cues, supervision of patient - no hands on  Assistive Device Front wheel walker  Distance Ambulated (ft) 280 ft  Mobility Ambulated with assistance in hallway  Mobility Response Tolerated fair  Mobility performed by Mobility specialist  Bed Position Chair  $Mobility charge 1 Mobility   Pt received sitting EOB and agreeable. C/o not being able to sleep well and wanting something from RN for sleep. No complaints on walk and did not take any standing rest breaks. Left in chair with call bell in reach.   [facility] Mobility Specialist  M.S. Primary Phone: [phone] M.S. Secondary Phone: [phone]

## 2021-01-30 NOTE — Plan of Care (Signed)
°  Problem: Education: Goal: Ability to demonstrate management of disease process will improve Outcome: Progressing   Problem: Activity: Goal: Capacity to carry out activities will improve Outcome: Progressing   Problem: Cardiac: Goal: Ability to achieve and maintain adequate cardiopulmonary perfusion will improve Outcome: Progressing   Problem: Education: Goal: Knowledge of General Education information will improve Description: Including pain rating scale, medication(s)/side effects and non-pharmacologic comfort measures Outcome: Progressing   Problem: Clinical Measurements: Goal: Ability to maintain clinical measurements within normal limits will improve Outcome: Progressing   Problem: Clinical Measurements: Goal: Respiratory complications will improve Outcome: Progressing   Problem: Clinical Measurements: Goal: Cardiovascular complication will be avoided Outcome: Progressing   Problem: Safety: Goal: Ability to remain free from injury will improve Outcome: Progressing   Problem: Pain Managment: Goal: General experience of comfort will improve Outcome: Progressing

## 2021-01-31 ENCOUNTER — Encounter (HOSPITAL_COMMUNITY): Payer: Self-pay | Admitting: Internal Medicine

## 2021-01-31 ENCOUNTER — Other Ambulatory Visit (HOSPITAL_COMMUNITY): Payer: Self-pay

## 2021-01-31 DIAGNOSIS — I5043 Acute on chronic combined systolic (congestive) and diastolic (congestive) heart failure: Secondary | ICD-10-CM | POA: Diagnosis not present

## 2021-01-31 LAB — CBC
HCT: 54.1 % — ABNORMAL HIGH (ref 39.0–52.0)
Hemoglobin: 17.1 g/dL — ABNORMAL HIGH (ref 13.0–17.0)
MCH: 26.6 pg (ref 26.0–34.0)
MCHC: 31.6 g/dL (ref 30.0–36.0)
MCV: 84 fL (ref 80.0–100.0)
Platelets: 385 10*3/uL (ref 150–400)
RBC: 6.44 MIL/uL — ABNORMAL HIGH (ref 4.22–5.81)
RDW: 17.2 % — ABNORMAL HIGH (ref 11.5–15.5)
WBC: 12.1 10*3/uL — ABNORMAL HIGH (ref 4.0–10.5)
nRBC: 0 % (ref 0.0–0.2)

## 2021-01-31 LAB — BASIC METABOLIC PANEL
Anion gap: 11 (ref 5–15)
BUN: 17 mg/dL (ref 6–20)
CO2: 27 mmol/L (ref 22–32)
Calcium: 9.1 mg/dL (ref 8.9–10.3)
Chloride: 100 mmol/L (ref 98–111)
Creatinine, Ser: 1.24 mg/dL (ref 0.61–1.24)
GFR, Estimated: >60 mL/min (ref >60)
Glucose, Bld: 87 mg/dL (ref 70–99)
Potassium: 4 mmol/L (ref 3.5–5.1)
Sodium: 138 mmol/L (ref 135–145)

## 2021-01-31 LAB — GLUCOSE, CAPILLARY
Glucose-Capillary: 120 mg/dL — ABNORMAL HIGH (ref 70–99)
Glucose-Capillary: 123 mg/dL — ABNORMAL HIGH (ref 70–99)
Glucose-Capillary: 128 mg/dL — ABNORMAL HIGH (ref 70–99)
Glucose-Capillary: 121 mg/dL — ABNORMAL HIGH (ref 70–99)

## 2021-01-31 MED ORDER — FUROSEMIDE 10 MG/ML IJ SOLN
80.0000 mg | Freq: Every day | INTRAMUSCULAR | Status: DC
  Administered 2021-02-02 – 2021-02-05 (×4): 80 mg via INTRAVENOUS
  Filled 2021-01-31 (×5): qty 8

## 2021-01-31 MED ORDER — SODIUM CHLORIDE 0.9% FLUSH
3.0000 mL | Freq: Two times a day (BID) | INTRAVENOUS | Status: DC
  Administered 2021-01-31 – 2021-02-06 (×3): 3 mL via INTRAVENOUS

## 2021-01-31 MED ORDER — SODIUM CHLORIDE 0.9 % IV SOLN: 250.0000 mL | INTRAVENOUS | Status: DC | PRN

## 2021-01-31 MED ORDER — ENOXAPARIN SODIUM 80 MG/0.8ML IJ SOSY: 80.0000 mg | PREFILLED_SYRINGE | INTRAMUSCULAR | Status: DC

## 2021-01-31 MED ORDER — SODIUM CHLORIDE 0.9 % IV SOLN: INTRAVENOUS | Status: DC

## 2021-01-31 MED ORDER — SODIUM CHLORIDE 0.9% FLUSH: 3.0000 mL | INTRAVENOUS | Status: DC | PRN

## 2021-01-31 NOTE — TOC Benefit Eligibility Note (Addendum)
Patient Advocate Encounter   Received notification that prior authorization for Entresto 24-26 mg Tab is required.   PA submitted on 01/31/2021 [employer] and [id number] Status is pending       Lyndel Safe, [place] Patient Advocate Specialist [place] Patient Advocate Team Direct Number: [phone]  Fax: [phone]

## 2021-01-31 NOTE — Progress Notes (Signed)
Mobility Specialist Progress Note    01/31/21 1310  Mobility  Activity Ambulated in hall  Level of Assistance Standby assist, set-up cues, supervision of patient - no hands on  Assistive Device Front wheel walker  Distance Ambulated (ft) 320 ft  Mobility Ambulated with assistance in hallway  Mobility Response Tolerated fair  Mobility performed by Mobility specialist  $Mobility charge 1 Mobility   Pt received sitting EOB and agreeable. No complaints on walk. Pt stated he was interested in going home with an AD, CM notified. Left sitting EOB with call bell in reach.  [facility] Mobility Specialist  M.S. Primary Phone: [phone] M.S. Secondary Phone: [phone]
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
## 2021-01-31 NOTE — Consult Note (Signed)
Ref: Jonathon Resides, MD   Subjective:  Awake. Able to sleep flat in bad.  Good diuresis over 4 days. VS stable.  Objective:  Vital Signs in the last 24 hours: Temp:  [97.7 F (36.5 C)-98.1 F (36.7 C)] 97.7 F (36.5 C) (01/04 1437) Pulse Rate:  [61-80] 63 (01/04 1655) Cardiac Rhythm: Normal sinus rhythm;Heart block (01/04 0701) Resp:  [18-19] 18 (01/04 1437) BP: (97-134)/(73-92) 125/82 (01/04 1655) SpO2:  [95 %-96 %] 96 % (01/03 2239) Weight:  [160.5 kg] 160.5 kg (01/04 0302)  Physical Exam: BP Readings from Last 1 Encounters:  01/31/21 125/82     Wt Readings from Last 1 Encounters:  01/31/21 (!) 160.5 kg    Weight change: -2.177 kg Body mass index is 50.78 kg/m. HEENT: /AT, Eyes-Brown, Conjunctiva-Pink, Sclera-Non-icteric Neck: No JVD, No bruit, Trachea midline. Lungs:  Clearing, Bilateral. Cardiac:  Regular rhythm, normal S1 and S2, no S3. II/VI systolic murmur. Abdomen:  Soft, non-tender. BS present. Extremities:  1 + edema present. No cyanosis. No clubbing. CNS: AxOx3, Cranial nerves grossly intact, moves all 4 extremities.  Skin: Warm and dry.   Intake/Output from previous day: 01/03 0701 - 01/04 0700 In: 360 [P.O.:360] Out: 2900 [Urine:2900]    Lab Results: BMET    Component Value Date/Time   NA 138 01/31/2021 0218   NA 138 01/30/2021 0337   NA 137 01/29/2021 0415   K 4.0 01/31/2021 0218   K 3.7 01/30/2021 0337   K 4.1 01/29/2021 0415   CL 100 01/31/2021 0218   CL 97 (L) 01/30/2021 0337   CL 100 01/29/2021 0415   CO2 27 01/31/2021 0218   CO2 29 01/30/2021 0337   CO2 27 01/29/2021 0415   GLUCOSE 87 01/31/2021 0218   GLUCOSE 97 01/30/2021 0337   GLUCOSE 91 01/29/2021 0415   BUN 17 01/31/2021 0218   BUN 15 01/30/2021 0337   BUN 11 01/29/2021 0415   CREATININE 1.24 01/31/2021 0218   CREATININE 1.22 01/30/2021 0337   CREATININE 1.16 01/29/2021 0415   CREATININE 1.11 09/09/2014 1017   CALCIUM 9.1 01/31/2021 0218   CALCIUM 8.6 (L) 01/30/2021  0337   CALCIUM 8.6 (L) 01/29/2021 0415   GFRNONAA >60 01/31/2021 0218   GFRNONAA >60 01/30/2021 0337   GFRNONAA >60 01/29/2021 0415   GFRAA >60 07/30/2018 1115   GFRAA >60 12/23/2017 0915   GFRAA >60 08/04/2017 2006   CBC    Component Value Date/Time   WBC 12.1 (H) 01/31/2021 0218   RBC 6.44 (H) 01/31/2021 0218   HGB 17.1 (H) 01/31/2021 0218   HCT 54.1 (H) 01/31/2021 0218   PLT 385 01/31/2021 0218   MCV 84.0 01/31/2021 0218   MCH 26.6 01/31/2021 0218   MCHC 31.6 01/31/2021 0218   RDW 17.2 (H) 01/31/2021 0218   LYMPHSABS 5.2 (H) 01/27/2021 2002   MONOABS 0.7 01/27/2021 2002   EOSABS 0.3 01/27/2021 2002   BASOSABS 0.1 01/27/2021 2002   HEPATIC Function Panel Recent Labs    01/27/21 2002  PROT 7.3   HEMOGLOBIN A1C No components found for: HGA1C,  MPG CARDIAC ENZYMES Lab Results  Component Value Date   TROPONINI <0.03 08/04/2017   TROPONINI 0.08 (H) 08/31/2014   TROPONINI 0.08 (H) 08/31/2014   BNP No results for input(s): PROBNP in the last 8760 hours. TSH Recent Labs    01/28/21 0246  TSH 1.003   CHOLESTEROL No results for input(s): CHOL in the last 8760 hours.  Scheduled Meds:  aspirin EC  81  mg Oral Daily   atorvastatin  40 mg Oral Daily   carvedilol  6.25 mg Oral BID WC   chlorhexidine  15 mL Mouth Rinse BID   empagliflozin  10 mg Oral Daily   [START ON 02/01/2021] enoxaparin (LOVENOX) injection  80 mg Subcutaneous Q24H   [START ON 02/01/2021] furosemide  80 mg Intravenous Daily   influenza vac split quadrivalent PF  0.5 mL Intramuscular Tomorrow-1000   insulin aspart  0-15 Units Subcutaneous TID WC   insulin aspart  0-5 Units Subcutaneous QHS   insulin aspart  6 Units Subcutaneous TID WC   potassium chloride  20 mEq Oral Daily   sacubitril-valsartan  1 tablet Oral BID   sodium chloride flush  3 mL Intravenous Q12H   sodium chloride flush  3 mL Intravenous Q12H   Continuous Infusions:  sodium chloride     PRN Meds:.sodium chloride, acetaminophen,  guaiFENesin-dextromethorphan, sodium chloride flush  Assessment/Plan: Acute on chronic systolic left heart failure Dilated cardiomyopathy HTN HLD Morbid obesity Type 2 DM  Plan: Cardiac cath tomorrow. Patient understood procedure, risk and alternatives wants to proceed with cardiac catheterization.   LOS: 3 days   Time spent including chart review, lab review, examination, discussion with patient : 30 min   Dixie Dials  MD  01/31/2021, 8:09 PM

## 2021-01-31 NOTE — Plan of Care (Signed)
°  Problem: Education: Goal: Ability to demonstrate management of disease process will improve Outcome: Progressing   Problem: Activity: Goal: Capacity to carry out activities will improve Outcome: Progressing   Problem: Cardiac: Goal: Ability to achieve and maintain adequate cardiopulmonary perfusion will improve Outcome: Progressing   Problem: Clinical Measurements: Goal: Ability to maintain clinical measurements within normal limits will improve Outcome: Progressing   Problem: Clinical Measurements: Goal: Respiratory complications will improve Outcome: Progressing   Problem: Clinical Measurements: Goal: Cardiovascular complication will be avoided Outcome: Progressing   Problem: Activity: Goal: Risk for activity intolerance will decrease Outcome: Progressing   Problem: Safety: Goal: Ability to remain free from injury will improve Outcome: Progressing

## 2021-01-31 NOTE — TOC Benefit Eligibility Note (Addendum)
Patient Teacher, English as a foreign language completed.    The patient is currently admitted and upon discharge could be taking Entresto 24-26 mg.  Prior Authorization Required  The patient is currently admitted and upon discharge could be taking Farxiga 10 mg.  Prior Authorization Required  The patient is currently admitted and upon discharge could be taking Jardiance 10 mg.  The current 30 day co-pay is, $4.00.   The patient is insured through [employer] and [place], [place] Patient Advocate Specialist [place] Patient Advocate Team Direct Number: [phone]  Fax: [phone]  .

## 2021-01-31 NOTE — Progress Notes (Signed)
PROGRESS NOTE   John Conner  FVC:[id number]    DOB: 1960-09-08    DOA: 01/27/2021  PCP: Jonathon Resides, MD   I have briefly reviewed patients previous medical records in [facility].  Chief Complaint  Patient presents with   Shortness of Breath    Brief Narrative:  61 year old male with medical history significant for HTN, HLD, type II DM, chronic systolic CHF, morbid obesity, OSA on CPAP, stopped taking his Lasix and HCTZ for approximately a week prior to admission due to insurance issues and presented with progressive dyspnea and leg edema.  He also developed cough on Christmas Day and tested positive for COVID-19 on 12/26, treated with Paxlovid.  In the ED hypoxic to 89% on room air.  Admitted for acute respiratory failure with hypoxia due to acute on chronic combined systolic and diastolic CHF, elevated troponins felt to be due to demand ischemia but cannot rule out ACS.  Cardiology following and planning cardiac cath pending volume optimization.   Assessment & Plan:  Principal Problem:   Acute on chronic diastolic heart failure (HCC) Active Problems:   Essential hypertension, benign   Hypocalcemia   OSA (obstructive sleep apnea)   Hypoxia   Morbid obesity with BMI of 50.0-59.9, adult (HCC)   NSTEMI (non-ST elevated myocardial infarction) ([place])   Diabetes mellitus type 2 in obese (HCC)   Prolonged QT interval   Acute on chronic combined systolic and diastolic CHF: 2D echo 06/30/8464: LVEF 30-35%, LV with global hypokinesis and grade 2 diastolic dysfunction.  Moderate MR.  EF appears slightly worse than prior echo in 2016. Concern for ischemic cardiomyopathy with dilated cardiomyopathy IV Lasix was increased from 40 to 80 mg every 12 hours. -9.8 L since admission and weight down approximately 20 pounds. Remains significantly volume overloaded. Continue aspirin, carvedilol, Entresto and Jardiance  Acute respiratory failure with hypoxia secondary to decompensated  CHF: Had respiratory distress and oxygen saturations of 89% on room air on arrival to ED. Appears to have weaned down to room air.  Elevated troponins Mildly elevated high-sensitivity troponins, peaked to 145.  No anginal symptoms. Suspect oxygen supply demand mismatch due to presentation with decompensated CHF and acute respiratory failure but ACS cannot be ruled out. Cardiology consulting and plan cardiac cath pending stabilization of volume status. As per discussion with Dr. Doylene Canard, plan for cath 1/5.  Type II DM A1c 6.4.  Metformin on hold. Good control on SSI.  Essential hypertension Controlled. Continue carvedilol.  Entresto initiated.  OSA on CPAP Continue  Prolonged QTC EKG 12/31 showed QTC of 490 ms.  Most recent potassium is 4 and magnesium 2.  Repeat and follow EKG.  Minimize QT prolonging meds.  Mild dilatation of ascending aorta Noted on 2D echo 01/28/2021.  Measuring 41 mm with mild atheroma plaque involving the aortic root and ascending aorta. Outpatient follow-up.  Body mass index is 50.78 kg/m./Morbid obesity Needs lifestyle modification and weight loss.   DVT prophylaxis: Place TED hose Start: 01/28/21 1212 Place TED hose Start: 01/28/21 1009     Code Status: Full Code Family Communication: None at bedside Disposition:  Status is: Inpatient  Remains inpatient appropriate because: Severity of illness, ongoing aggressive IV Lasix diuresis and need for cardiac cath.      Consultants:   Cardiology  Procedures:   None  Antimicrobials:   None   Subjective:  Overall feels better.  Dyspnea improved but breathing not yet at baseline.  Feels that he can lie flat for procedure.  No chest pain.  Leg swelling also improved but still present.  Denies smoking.  Objective:   Vitals:   01/31/21 0306 01/31/21 0740 01/31/21 0834 01/31/21 1437  BP: (!) 134/92 118/80 118/80 101/81  Pulse:  68 77   Resp: 19 18  18   Temp: 97.9 F (36.6 C) 98 F (36.7 C)   97.7 F (36.5 C)  TempSrc: Axillary Oral  Oral  SpO2:      Weight:      Height:        General exam: Middle-age male, moderately built and morbidly obese sitting up comfortably in chair this morning. Respiratory system: Slightly diminished breath sounds in the bases but otherwise clear to auscultation.  No increased work of breathing Cardiovascular system: S1 & S2 heard, RRR. No JVD, murmurs, rubs, gallops or clicks.  2+ pitting bilateral leg edema up to knees.  Telemetry personally reviewed: Sinus rhythm. Gastrointestinal system: Abdomen is nondistended but protuberant, soft and nontender. No organomegaly or masses felt. Normal bowel sounds heard. Central nervous system: Alert and oriented. No focal neurological deficits. Extremities: Symmetric 5 x 5 power. Skin: No rashes, lesions or ulcers Psychiatry: Judgement and insight appear normal. Mood & affect appropriate.     Data Reviewed:   I have personally reviewed following labs and imaging studies   CBC: Recent Labs  Lab 01/27/21 2002 01/28/21 0246 01/31/21 0218  WBC 12.8* 11.7* 12.1*  NEUTROABS 6.4  --   --   HGB 16.2 15.7 17.1*  HCT 51.9 51.2 54.1*  MCV 85.1 86.1 84.0  PLT 340 290 629    Basic Metabolic Panel: Recent Labs  Lab 01/27/21 2002 01/29/21 0415 01/30/21 0337 01/31/21 0218  NA 138 137 138 138  K 3.6 4.1 3.7 4.0  CL 102 100 97* 100  CO2 25 27 29 27   GLUCOSE 131* 91 97 87  BUN 13 11 15 17   CREATININE 1.21 1.16 1.22 1.24  CALCIUM 8.6* 8.6* 8.6* 9.1  MG 2.0  --   --   --     Liver Function Tests: Recent Labs  Lab 01/27/21 2002  AST 54*  ALT 77*  ALKPHOS 72  BILITOT 1.0  PROT 7.3  ALBUMIN 3.9    CBG: Recent Labs  Lab 01/30/21 2038 01/31/21 0739 01/31/21 1119  GLUCAP 106* 120* 123*    Microbiology Studies:   Recent Results (from the past 240 hour(s))  Resp Panel by RT-PCR (Flu A&B, Covid) Nasopharyngeal Swab     Status: None   Collection Time: 01/27/21  7:54 PM   Specimen:  Nasopharyngeal Swab; Nasopharyngeal(NP) swabs in vial transport medium  Result Value Ref Range Status   SARS Coronavirus 2 by RT PCR NEGATIVE NEGATIVE Final    Comment: (NOTE) SARS-CoV-2 target nucleic acids are NOT DETECTED.  The SARS-CoV-2 RNA is generally detectable in upper respiratory specimens during the acute phase of infection. The lowest concentration of SARS-CoV-2 viral copies this assay can detect is 138 copies/mL. A negative result does not preclude SARS-Cov-2 infection and should not be used as the sole basis for treatment or other patient management decisions. A negative result may occur with  improper specimen collection/handling, submission of specimen other than nasopharyngeal swab, presence of viral mutation(s) within the areas targeted by this assay, and inadequate number of viral copies(<138 copies/mL). A negative result must be combined with clinical observations, patient history, and epidemiological information. The expected result is Negative.  Fact Sheet for Patients:  EntrepreneurPulse.com.au  Fact Sheet for Healthcare Providers:  IncredibleEmployment.be  This test is no t yet approved or cleared by the [place] and  has been authorized for detection and/or diagnosis of SARS-CoV-2 by FDA under an Emergency Use Authorization (EUA). This EUA will remain  in effect (meaning this test can be used) for the duration of the COVID-19 declaration under Section 564(b)(1) of the Act, 21 U.S.C.section 360bbb-3(b)(1), unless the authorization is terminated  or revoked sooner.       Influenza A by PCR NEGATIVE NEGATIVE Final   Influenza B by PCR NEGATIVE NEGATIVE Final    Comment: (NOTE) The Xpert Xpress SARS-CoV-2/FLU/RSV plus assay is intended as an aid in the diagnosis of influenza from Nasopharyngeal swab specimens and should not be used as a sole basis for treatment. Nasal washings and aspirates are unacceptable for  Xpert Xpress SARS-CoV-2/FLU/RSV testing.  Fact Sheet for Patients: EntrepreneurPulse.com.au  Fact Sheet for Healthcare Providers: IncredibleEmployment.be  This test is not yet approved or cleared by the [place] FDA and has been authorized for detection and/or diagnosis of SARS-CoV-2 by FDA under an Emergency Use Authorization (EUA). This EUA will remain in effect (meaning this test can be used) for the duration of the COVID-19 declaration under Section 564(b)(1) of the Act, 21 U.S.C. section 360bbb-3(b)(1), unless the authorization is terminated or revoked.  Performed at [facility], [address] [place], [place] [address]   Resp Panel by RT-PCR (Flu A&B, Covid) Nasopharyngeal Swab     Status: None   Collection Time: 01/30/21 12:40 PM   Specimen: Nasopharyngeal Swab; Nasopharyngeal(NP) swabs in vial transport medium  Result Value Ref Range Status   SARS Coronavirus 2 by RT PCR NEGATIVE NEGATIVE Final    Comment: (NOTE) SARS-CoV-2 target nucleic acids are NOT DETECTED.  The SARS-CoV-2 RNA is generally detectable in upper respiratory specimens during the acute phase of infection. The lowest concentration of SARS-CoV-2 viral copies this assay can detect is 138 copies/mL. A negative result does not preclude SARS-Cov-2 infection and should not be used as the sole basis for treatment or other patient management decisions. A negative result may occur with  improper specimen collection/handling, submission of specimen other than nasopharyngeal swab, presence of viral mutation(s) within the areas targeted by this assay, and inadequate number of viral copies(<138 copies/mL). A negative result must be combined with clinical observations, patient history, and epidemiological information. The expected result is Negative.  Fact Sheet for Patients:  EntrepreneurPulse.com.au  Fact Sheet for Healthcare Providers:   IncredibleEmployment.be  This test is no t yet approved or cleared by the [place] FDA and  has been authorized for detection and/or diagnosis of SARS-CoV-2 by FDA under an Emergency Use Authorization (EUA). This EUA will remain  in effect (meaning this test can be used) for the duration of the COVID-19 declaration under Section 564(b)(1) of the Act, 21 U.S.C.section 360bbb-3(b)(1), unless the authorization is terminated  or revoked sooner.       Influenza A by PCR NEGATIVE NEGATIVE Final   Influenza B by PCR NEGATIVE NEGATIVE Final    Comment: (NOTE) The Xpert Xpress SARS-CoV-2/FLU/RSV plus assay is intended as an aid in the diagnosis of influenza from Nasopharyngeal swab specimens and should not be used as a sole basis for treatment. Nasal washings and aspirates are unacceptable for Xpert Xpress SARS-CoV-2/FLU/RSV testing.  Fact Sheet for Patients: EntrepreneurPulse.com.au  Fact Sheet for Healthcare Providers: IncredibleEmployment.be  This test is not yet approved or cleared by the [place] and has been authorized  for detection and/or diagnosis of SARS-CoV-2 by FDA under an Emergency Use Authorization (EUA). This EUA will remain in effect (meaning this test can be used) for the duration of the COVID-19 declaration under Section 564(b)(1) of the Act, 21 U.S.C. section 360bbb-3(b)(1), unless the authorization is terminated or revoked.  Performed at [facility], [place] [address], [place], [place] [address]     Radiology Studies:  No results found.  Scheduled Meds:    aspirin EC  81 mg Oral Daily   atorvastatin  40 mg Oral Daily   carvedilol  6.25 mg Oral BID WC   chlorhexidine  15 mL Mouth Rinse BID   empagliflozin  10 mg Oral Daily   [START ON 02/01/2021] enoxaparin (LOVENOX) injection  80 mg Subcutaneous Q24H   [START ON 02/01/2021] furosemide  80 mg Intravenous Daily   influenza vac split  quadrivalent PF  0.5 mL Intramuscular Tomorrow-1000   insulin aspart  0-15 Units Subcutaneous TID WC   insulin aspart  0-5 Units Subcutaneous QHS   insulin aspart  6 Units Subcutaneous TID WC   potassium chloride  20 mEq Oral Daily   sacubitril-valsartan  1 tablet Oral BID   sodium chloride flush  3 mL Intravenous Q12H    Continuous Infusions:    sodium chloride       LOS: 3 days     Vernell Leep, MD,  FACP, [facility], [facility], [facility] (Care Management Physician Certified) [place]  To contact the attending provider between 7A-7P or the covering provider during after hours 7P-7A, please log into the web site [email or web address] and access using universal [place] password for that web site. If you do not have the password, please call the hospital operator.  01/31/2021, 3:42 PM

## 2021-01-31 NOTE — H&P (View-Only) (Signed)
Ref: Jonathon Resides, MD   Subjective:  Awake. Able to sleep flat in bad.  Good diuresis over 4 days. VS stable.  Objective:  Vital Signs in the last 24 hours: Temp:  [97.7 F (36.5 C)-98.1 F (36.7 C)] 97.7 F (36.5 C) (01/04 1437) Pulse Rate:  [61-80] 63 (01/04 1655) Cardiac Rhythm: Normal sinus rhythm;Heart block (01/04 0701) Resp:  [18-19] 18 (01/04 1437) BP: (97-134)/(73-92) 125/82 (01/04 1655) SpO2:  [95 %-96 %] 96 % (01/03 2239) Weight:  [160.5 kg] 160.5 kg (01/04 0302)  Physical Exam: BP Readings from Last 1 Encounters:  01/31/21 125/82     Wt Readings from Last 1 Encounters:  01/31/21 (!) 160.5 kg    Weight change: -2.177 kg Body mass index is 50.78 kg/m. HEENT: [place]/AT, Eyes-Brown, Conjunctiva-Pink, Sclera-Non-icteric Neck: No JVD, No bruit, Trachea midline. Lungs:  Clearing, Bilateral. Cardiac:  Regular rhythm, normal S1 and S2, no S3. II/VI systolic murmur. Abdomen:  Soft, non-tender. BS present. Extremities:  1 + edema present. No cyanosis. No clubbing. CNS: AxOx3, Cranial nerves grossly intact, moves all 4 extremities.  Skin: Warm and dry.   Intake/Output from previous day: 01/03 0701 - 01/04 0700 In: 360 [P.O.:360] Out: 2900 [Urine:2900]    Lab Results: BMET    Component Value Date/Time   NA 138 01/31/2021 0218   NA 138 01/30/2021 0337   NA 137 01/29/2021 0415   K 4.0 01/31/2021 0218   K 3.7 01/30/2021 0337   K 4.1 01/29/2021 0415   CL 100 01/31/2021 0218   CL 97 (L) 01/30/2021 0337   CL 100 01/29/2021 0415   CO2 27 01/31/2021 0218   CO2 29 01/30/2021 0337   CO2 27 01/29/2021 0415   GLUCOSE 87 01/31/2021 0218   GLUCOSE 97 01/30/2021 0337   GLUCOSE 91 01/29/2021 0415   BUN 17 01/31/2021 0218   BUN 15 01/30/2021 0337   BUN 11 01/29/2021 0415   CREATININE 1.24 01/31/2021 0218   CREATININE 1.22 01/30/2021 0337   CREATININE 1.16 01/29/2021 0415   CREATININE 1.11 09/09/2014 1017   CALCIUM 9.1 01/31/2021 0218   CALCIUM 8.6 (L) 01/30/2021  0337   CALCIUM 8.6 (L) 01/29/2021 0415   GFRNONAA >60 01/31/2021 0218   GFRNONAA >60 01/30/2021 0337   GFRNONAA >60 01/29/2021 0415   GFRAA >60 07/30/2018 1115   GFRAA >60 12/23/2017 0915   GFRAA >60 08/04/2017 2006   CBC    Component Value Date/Time   WBC 12.1 (H) 01/31/2021 0218   RBC 6.44 (H) 01/31/2021 0218   HGB 17.1 (H) 01/31/2021 0218   HCT 54.1 (H) 01/31/2021 0218   PLT 385 01/31/2021 0218   MCV 84.0 01/31/2021 0218   MCH 26.6 01/31/2021 0218   MCHC 31.6 01/31/2021 0218   RDW 17.2 (H) 01/31/2021 0218   LYMPHSABS 5.2 (H) 01/27/2021 2002   MONOABS 0.7 01/27/2021 2002   EOSABS 0.3 01/27/2021 2002   BASOSABS 0.1 01/27/2021 2002   HEPATIC Function Panel Recent Labs    01/27/21 2002  PROT 7.3   HEMOGLOBIN A1C No components found for: HGA1C,  MPG CARDIAC ENZYMES Lab Results  Component Value Date   TROPONINI <0.03 08/04/2017   TROPONINI 0.08 (H) 08/31/2014   TROPONINI 0.08 (H) 08/31/2014   BNP No results for input(s): PROBNP in the last 8760 hours. TSH Recent Labs    01/28/21 0246  TSH 1.003   CHOLESTEROL No results for input(s): CHOL in the last 8760 hours.  Scheduled Meds:  aspirin EC  81  mg Oral Daily   atorvastatin  40 mg Oral Daily   carvedilol  6.25 mg Oral BID WC   chlorhexidine  15 mL Mouth Rinse BID   empagliflozin  10 mg Oral Daily   [START ON 02/01/2021] enoxaparin (LOVENOX) injection  80 mg Subcutaneous Q24H   [START ON 02/01/2021] furosemide  80 mg Intravenous Daily   influenza vac split quadrivalent PF  0.5 mL Intramuscular Tomorrow-1000   insulin aspart  0-15 Units Subcutaneous TID WC   insulin aspart  0-5 Units Subcutaneous QHS   insulin aspart  6 Units Subcutaneous TID WC   potassium chloride  20 mEq Oral Daily   sacubitril-valsartan  1 tablet Oral BID   sodium chloride flush  3 mL Intravenous Q12H   sodium chloride flush  3 mL Intravenous Q12H   Continuous Infusions:  sodium chloride     PRN Meds:.sodium chloride, acetaminophen,  guaiFENesin-dextromethorphan, sodium chloride flush  Assessment/Plan: Acute on chronic systolic left heart failure Dilated cardiomyopathy HTN HLD Morbid obesity Type 2 DM  Plan: Cardiac cath tomorrow. Patient understood procedure, risk and alternatives wants to proceed with cardiac catheterization.   LOS: 3 days   Time spent including chart review, lab review, examination, discussion with patient : 30 min   Dixie Dials  MD  01/31/2021, 8:09 PM

## 2021-02-01 ENCOUNTER — Encounter (HOSPITAL_COMMUNITY): Payer: Self-pay | Admitting: Cardiovascular Disease

## 2021-02-01 ENCOUNTER — Encounter (HOSPITAL_COMMUNITY)
Admission: EM | Disposition: A | Discharge status: Home / Self Care | Payer: Self-pay | Source: Home / Self Care | Attending: Internal Medicine

## 2021-02-01 DIAGNOSIS — I5033 Acute on chronic diastolic (congestive) heart failure: Secondary | ICD-10-CM | POA: Diagnosis not present

## 2021-02-01 HISTORY — PX: CORONARY ANGIOGRAPHY: CATH118303

## 2021-02-01 LAB — COMPREHENSIVE METABOLIC PANEL
ALT: 65 U/L — ABNORMAL HIGH (ref 0–44)
AST: 52 U/L — ABNORMAL HIGH (ref 15–41)
Albumin: 3.9 g/dL (ref 3.5–5.0)
Alkaline Phosphatase: 79 U/L (ref 38–126)
Anion gap: 10 (ref 5–15)
BUN: 17 mg/dL (ref 6–20)
CO2: 27 mmol/L (ref 22–32)
Calcium: 9.2 mg/dL (ref 8.9–10.3)
Chloride: 97 mmol/L — ABNORMAL LOW (ref 98–111)
Creatinine, Ser: 1.3 mg/dL — ABNORMAL HIGH (ref 0.61–1.24)
GFR, Estimated: >60 mL/min (ref >60)
Glucose, Bld: 94 mg/dL (ref 70–99)
Potassium: 3.5 mmol/L (ref 3.5–5.1)
Sodium: 134 mmol/L — ABNORMAL LOW (ref 135–145)
Total Bilirubin: 1.3 mg/dL — ABNORMAL HIGH (ref 0.3–1.2)
Total Protein: 7.4 g/dL (ref 6.5–8.1)

## 2021-02-01 LAB — GLUCOSE, CAPILLARY
Glucose-Capillary: 135 mg/dL — ABNORMAL HIGH (ref 70–99)
Glucose-Capillary: 127 mg/dL — ABNORMAL HIGH (ref 70–99)
Glucose-Capillary: 118 mg/dL — ABNORMAL HIGH (ref 70–99)
Glucose-Capillary: 131 mg/dL — ABNORMAL HIGH (ref 70–99)

## 2021-02-01 LAB — CBC
HCT: 58.8 % — ABNORMAL HIGH (ref 39.0–52.0)
Hemoglobin: 18 g/dL — ABNORMAL HIGH (ref 13.0–17.0)
MCH: 26.2 pg (ref 26.0–34.0)
MCHC: 30.6 g/dL (ref 30.0–36.0)
MCV: 85.6 fL (ref 80.0–100.0)
Platelets: 385 10*3/uL (ref 150–400)
RBC: 6.87 MIL/uL — ABNORMAL HIGH (ref 4.22–5.81)
RDW: 17.7 % — ABNORMAL HIGH (ref 11.5–15.5)
WBC: 12 10*3/uL — ABNORMAL HIGH (ref 4.0–10.5)
nRBC: 0 % (ref 0.0–0.2)

## 2021-02-01 SURGERY — CORONARY ANGIOGRAPHY (CATH LAB)
Anesthesia: LOCAL

## 2021-02-01 MED ORDER — HEPARIN SODIUM (PORCINE) 1000 UNIT/ML IJ SOLN
INTRAMUSCULAR | Status: DC | PRN
  Administered 2021-02-01: 7000 [IU] via INTRAVENOUS

## 2021-02-01 MED ORDER — MIDAZOLAM HCL 2 MG/2ML IJ SOLN
INTRAMUSCULAR | Status: DC | PRN
  Administered 2021-02-01: 1 mg via INTRAVENOUS

## 2021-02-01 MED ORDER — HEPARIN SODIUM (PORCINE) 1000 UNIT/ML IJ SOLN
INTRAMUSCULAR | Status: AC
  Filled 2021-02-01: qty 10

## 2021-02-01 MED ORDER — MIDAZOLAM HCL 2 MG/2ML IJ SOLN
INTRAMUSCULAR | Status: AC
  Filled 2021-02-01: qty 2

## 2021-02-01 MED ORDER — LIDOCAINE HCL (PF) 1 % IJ SOLN
INTRAMUSCULAR | Status: AC
  Filled 2021-02-01: qty 30

## 2021-02-01 MED ORDER — ENOXAPARIN SODIUM 80 MG/0.8ML IJ SOSY
80.0000 mg | PREFILLED_SYRINGE | INTRAMUSCULAR | Status: DC
  Administered 2021-02-01 – 2021-02-06 (×6): 80 mg via SUBCUTANEOUS
  Filled 2021-02-01 (×7): qty 0.8

## 2021-02-01 MED ORDER — SODIUM CHLORIDE 0.9 % IV SOLN: 250.0000 mL | INTRAVENOUS | Status: DC | PRN

## 2021-02-01 MED ORDER — FENTANYL CITRATE (PF) 100 MCG/2ML IJ SOLN
INTRAMUSCULAR | Status: DC | PRN
  Administered 2021-02-01: 25 ug via INTRAVENOUS

## 2021-02-01 MED ORDER — VERAPAMIL HCL 2.5 MG/ML IV SOLN
INTRAVENOUS | Status: AC
  Filled 2021-02-01: qty 2

## 2021-02-01 MED ORDER — SODIUM CHLORIDE 0.9% FLUSH: 3.0000 mL | INTRAVENOUS | Status: DC | PRN

## 2021-02-01 MED ORDER — HYDRALAZINE HCL 20 MG/ML IJ SOLN: 10.0000 mg | INTRAMUSCULAR | Status: AC | PRN

## 2021-02-01 MED ORDER — HEPARIN (PORCINE) IN NACL 1000-0.9 UT/500ML-% IV SOLN
INTRAVENOUS | Status: DC | PRN
  Administered 2021-02-01 (×2): 500 mL

## 2021-02-01 MED ORDER — LABETALOL HCL 5 MG/ML IV SOLN: 10.0000 mg | INTRAVENOUS | Status: AC | PRN

## 2021-02-01 MED ORDER — FENTANYL CITRATE (PF) 100 MCG/2ML IJ SOLN
INTRAMUSCULAR | Status: AC
  Filled 2021-02-01: qty 2

## 2021-02-01 MED ORDER — SODIUM CHLORIDE 0.9% FLUSH
3.0000 mL | Freq: Two times a day (BID) | INTRAVENOUS | Status: DC
  Administered 2021-02-01 – 2021-02-07 (×10): 3 mL via INTRAVENOUS

## 2021-02-01 MED ORDER — HEPARIN (PORCINE) IN NACL 1000-0.9 UT/500ML-% IV SOLN
INTRAVENOUS | Status: AC
  Filled 2021-02-01: qty 1000

## 2021-02-01 MED ORDER — IOHEXOL 350 MG/ML SOLN
INTRAVENOUS | Status: DC | PRN
  Administered 2021-02-01: 40 mL

## 2021-02-01 MED ORDER — LIDOCAINE HCL (PF) 1 % IJ SOLN
INTRAMUSCULAR | Status: DC | PRN
  Administered 2021-02-01: 4 mL

## 2021-02-01 MED ORDER — VERAPAMIL HCL 2.5 MG/ML IV SOLN
INTRAVENOUS | Status: DC | PRN
  Administered 2021-02-01: 10 mL via INTRA_ARTERIAL

## 2021-02-01 SURGICAL SUPPLY — 9 items
CATH 5FR JL3.5 JR4 ANG PIG MP (CATHETERS) ×1 IMPLANT
DEVICE RAD COMP TR BAND LRG (VASCULAR PRODUCTS) ×1 IMPLANT
GLIDESHEATH SLEND SS 6F .021 (SHEATH) ×1 IMPLANT
GUIDEWIRE INQWIRE 1.5J.035X260 (WIRE) IMPLANT
INQWIRE 1.5J .035X260CM (WIRE) ×2
KIT HEART LEFT (KITS) ×2 IMPLANT
PACK CARDIAC CATHETERIZATION (CUSTOM PROCEDURE TRAY) ×2 IMPLANT
SHEATH PROBE COVER 6X72 (BAG) ×1 IMPLANT
TRANSDUCER W/STOPCOCK (MISCELLANEOUS) ×2 IMPLANT

## 2021-02-01 NOTE — TOC Progression Note (Signed)
Transition of Care [facility]) - Progression Note    Patient Details  Name: John Conner MRN: [id number] Date of Birth: February 20, 1960  Transition of Care [facility]) CM/SW [place], RN Phone Number: 02/01/2021, 3:49 PM  Clinical Narrative:     Transition of Care [facility]) Screening Note   Patient Details  Name: John Conner Date of Birth: 01-Aug-1960   Transition of Care [facility]) CM/SW Contact:    Angelita Ingles, RN Phone Number: 02/01/2021, 3:49 PM    Transition of Care Department [facility]) has reviewed patient and no TOC needs have been identified at this time. We will continue to monitor patient advancement through interdisciplinary progression rounds. If new patient transition needs arise, please place a TOC consult.     Expected Discharge Plan: Home/Self Care Barriers to Discharge: Continued Medical Work up  Expected Discharge Plan and Services Expected Discharge Plan: Home/Self Care In-house Referral: NA Discharge Planning Services: CM Consult Post Acute Care Choice: NA Living arrangements for the past 2 months: Single Family Home                 DME Arranged: N/A DME Agency: NA       HH Arranged: NA HH Agency: NA         Social Determinants of Health (SDOH) Interventions Food Insecurity Interventions: Intervention Not Indicated Financial Strain Interventions: Intervention Not Indicated Housing Interventions: Intervention Not Indicated Transportation Interventions: Intervention Not Indicated  Readmission Risk Interventions No flowsheet data found.

## 2021-02-01 NOTE — Interval H&P Note (Signed)
History and Physical Interval Note:  02/01/2021 7:33 AM  John Conner  has presented today for surgery, with the diagnosis of heart failure.  The various methods of treatment have been discussed with the patient and family. After consideration of risks, benefits and other options for treatment, the patient has consented to  Procedure(s): LEFT HEART CATH AND CORONARY ANGIOGRAPHY (N/A) as a surgical intervention.  The patient's history has been reviewed, patient examined, no change in status, stable for surgery.  I have reviewed the patient's chart and labs.  Questions were answered to the patient's satisfaction.     Birdie Riddle

## 2021-02-01 NOTE — Progress Notes (Signed)
Mobility Specialist Progress Note    02/01/21 1521  Mobility  Activity Contraindicated/medical hold   Pt stated he was not feeling it today. Will f/u as schedule permits.  [facility] Mobility Specialist  M.S. Primary Phone: [phone] M.S. Secondary Phone: [phone]

## 2021-02-01 NOTE — Progress Notes (Signed)
PROGRESS NOTE  John Conner RDE:[id number] DOB: [birth year] DOA: 01/27/2021 PCP: Jonathon Resides, MD  Brief History   61 year old male with medical history significant for HTN, HLD, type II DM, chronic systolic CHF, morbid obesity, OSA on CPAP, stopped taking his Lasix and HCTZ for approximately a week prior to admission due to insurance issues and presented with progressive dyspnea and leg edema.  He also developed cough on Christmas Day and tested positive for COVID-19 on 12/26, treated with Paxlovid.  In the ED hypoxic to 89% on room air.  Admitted for acute respiratory failure with hypoxia due to acute on chronic combined systolic and diastolic CHF, elevated troponins felt to be due to demand ischemia but cannot rule out ACS.    Pt underwent LHC. It demonstrated LVEF of 30-35%. Medical management is recommended.  Consultants  Cardiology  Procedures  LHC  Antibiotics   Anti-infectives (From admission, onward)    None      Subjective  The patient is sitting up on the edge of bed. He is actively bleeding from his right wrist at the site of LHC. No other complaints.  Objective   Vitals:  Vitals:   02/01/21 1104 02/01/21 1735  BP: (!) 132/96 (!) 129/101  Pulse: 81 77  Resp: 18   Temp: 97.8 F (36.6 C)   SpO2: 90%    Exam:  Constitutional:  The patient is awake, alert, and oriented x 3. He is in mild distress due to bleeding from [facility] site.       Respiratory:  No increased work of breathing. No wheezes, rales, or rhonchi No tactile fremitus Cardiovascular:  Regular rate and rhythm No murmurs, ectopy, or gallups. No lateral PMI. No thrills. Abdomen:  Abdomen is soft, non-tender, non-distended No hernias, masses, or organomegaly Normoactive bowel sounds.  Musculoskeletal:  No cyanosis, clubbing, or edema Skin:  No rashes, lesions, ulcers palpation of skin: no induration or nodules Neurologic:  CN 2-12 intact Sensation all 4 extremities intact Psychiatric:   Mental status Mood, affect appropriate Orientation to person, place, time  judgment and insight appear intact   I have personally reviewed the following:   Today's Data  Vitals  Lab Data  CBC, CMP  Micro Data    Imaging    Cardiology Data  EKG Echocardiogram LHC  Other Data    Scheduled Meds:  aspirin EC  81 mg Oral Daily   atorvastatin  40 mg Oral Daily   carvedilol  6.25 mg Oral BID WC   chlorhexidine  15 mL Mouth Rinse BID   empagliflozin  10 mg Oral Daily   enoxaparin (LOVENOX) injection  80 mg Subcutaneous Q24H   furosemide  80 mg Intravenous Daily   influenza vac split quadrivalent PF  0.5 mL Intramuscular Tomorrow-1000   insulin aspart  0-15 Units Subcutaneous TID WC   insulin aspart  0-5 Units Subcutaneous QHS   insulin aspart  6 Units Subcutaneous TID WC   potassium chloride  20 mEq Oral Daily   sacubitril-valsartan  1 tablet Oral BID   sodium chloride flush  3 mL Intravenous Q12H   sodium chloride flush  3 mL Intravenous Q12H   sodium chloride flush  3 mL Intravenous Q12H   Continuous Infusions:  sodium chloride     sodium chloride      Principal Problem:   Acute on chronic diastolic heart failure (HCC) Active Problems:   Essential hypertension, benign   Hypocalcemia   OSA (obstructive sleep apnea)   Hypoxia  Morbid obesity with BMI of 50.0-59.9, adult (HCC)   NSTEMI (non-ST elevated myocardial infarction) ([place])   Diabetes mellitus type 2 in obese ([place])   Prolonged QT interval   LOS: 4 days   A & P  Acute on chronic combined systolic and diastolic CHF: 2D echo 04/29/7060: LVEF 30-35%, LV with global hypokinesis and grade 2 diastolic dysfunction.  Moderate MR.  EF appears slightly worse than prior echo in 2016. Concern for ischemic cardiomyopathy with dilated cardiomyopathy IV Lasix was increased from 40 to 80 mg every 12 hours. -9.8 L since admission and weight down approximately 20 pounds. Remains significantly volume  overloaded. Continue aspirin, carvedilol, Entresto and Jardiance LHC completed. No intervention required. EF 30-35%.   Acute respiratory failure with hypoxia secondary to decompensated CHF: Had respiratory distress and oxygen saturations of 89% on room air on arrival to ED. Appears to have weaned down to room air.   Elevated troponins Mildly elevated high-sensitivity troponins, peaked to 145.  No anginal symptoms. Suspect oxygen supply demand mismatch due to presentation with decompensated CHF and acute respiratory failure but ACS cannot be ruled out. Cardiology consulting and plan cardiac cath pending stabilization of volume status. As per discussion with Dr. Doylene Canard, plan for cath 1/5.   Type II DM A1c 6.4.  Metformin on hold. Good control on SSI.   Essential hypertension Controlled. Continue carvedilol.  Entresto initiated.   OSA on CPAP Continue   Prolonged QTC EKG 12/31 showed QTC of 490 ms.  Most recent potassium is 4 and magnesium 2.  Repeat and follow EKG.  Minimize QT prolonging meds.   Mild dilatation of ascending aorta Noted on 2D echo 01/28/2021.  Measuring 41 mm with mild atheroma plaque involving the aortic root and ascending aorta. Outpatient follow-up.   Body mass index is 50.78 kg/m./Morbid obesity Needs lifestyle modification and weight loss.  I have seen and examined this patient myself. I have spent 34 minutes in his evaluation and care.   DVT prophylaxis: Place TED hose Start: 01/28/21 1212 Place TED hose Start: 01/28/21 1009     Code Status: Full Code Family Communication: None at bedside Disposition:  Status is: Inpatient   Sidda Humm, DO [facility] Direct contact: see [email or web address]  7PM-7AM contact night coverage as above 02/01/2021, 5:49 PM  LOS: 4 days

## 2021-02-02 LAB — CBC
HCT: 57.8 % — ABNORMAL HIGH (ref 39.0–52.0)
Hemoglobin: 18.2 g/dL — ABNORMAL HIGH (ref 13.0–17.0)
MCH: 26.6 pg (ref 26.0–34.0)
MCHC: 31.5 g/dL (ref 30.0–36.0)
MCV: 84.4 fL (ref 80.0–100.0)
Platelets: 397 10*3/uL (ref 150–400)
RBC: 6.85 MIL/uL — ABNORMAL HIGH (ref 4.22–5.81)
RDW: 17.5 % — ABNORMAL HIGH (ref 11.5–15.5)
WBC: 12.5 10*3/uL — ABNORMAL HIGH (ref 4.0–10.5)
nRBC: 0 % (ref 0.0–0.2)

## 2021-02-02 LAB — BASIC METABOLIC PANEL
Anion gap: 13 (ref 5–15)
BUN: 16 mg/dL (ref 6–20)
CO2: 23 mmol/L (ref 22–32)
Calcium: 8.9 mg/dL (ref 8.9–10.3)
Chloride: 99 mmol/L (ref 98–111)
Creatinine, Ser: 1.16 mg/dL (ref 0.61–1.24)
GFR, Estimated: >60 mL/min (ref >60)
Glucose, Bld: 103 mg/dL — ABNORMAL HIGH (ref 70–99)
Potassium: 4.1 mmol/L (ref 3.5–5.1)
Sodium: 135 mmol/L (ref 135–145)

## 2021-02-02 LAB — GLUCOSE, CAPILLARY
Glucose-Capillary: 137 mg/dL — ABNORMAL HIGH (ref 70–99)
Glucose-Capillary: 119 mg/dL — ABNORMAL HIGH (ref 70–99)
Glucose-Capillary: 90 mg/dL (ref 70–99)
Glucose-Capillary: 120 mg/dL — ABNORMAL HIGH (ref 70–99)

## 2021-02-02 NOTE — Care Management (Addendum)
0623 02-02-21 Case Manager ordered a bariatric rolling walker for the patient via [employer]. Durable Medical Equipment (DME) will be delivered to the patients home via drop shipped. No bariatric rollator's available in the warehouse. [employer] to call the patient to make him aware. No further needs identified at this time.

## 2021-02-02 NOTE — Consult Note (Addendum)
Ref: John Resides, MD   Subjective:  Feeling better. VS stable. Creatinine remains stable. Patient aware of near normal coronaries.and decreased LV systolic function.  Objective:  Vital Signs in the last 24 hours: Temp:  [97.8 F (36.6 C)] 97.8 F (36.6 C) (01/06 0433) Pulse Rate:  [67-84] 68 (01/06 1133) Cardiac Rhythm: Normal sinus rhythm (01/06 0748) Resp:  [16-18] 16 (01/06 1133) BP: (96-129)/(67-101) 114/68 (01/06 1133) SpO2:  [90 %-95 %] 90 % (01/06 0433) Weight:  [159.7 kg] 159.7 kg (01/06 0433)  Physical Exam: BP Readings from Last 1 Encounters:  02/02/21 114/68     Wt Readings from Last 1 Encounters:  02/02/21 (!) 159.7 kg    Weight change: 0.227 kg Body mass index is 50.51 kg/m. HEENT: [place]/AT, Eyes-Brown, Conjunctiva-Pink, Sclera-Non-icteric Neck: No JVD, No bruit, Trachea midline. Lungs:  Clear, Bilateral. Cardiac:  Regular rhythm, normal S1 and S2, no S3. II/VI systolic murmur. Abdomen:  Soft, non-tender. BS present. Extremities:  Trace edema present. No cyanosis. No clubbing. Right radial cath site is stable with mild swelling. CNS: AxOx3, Cranial nerves grossly intact, moves all 4 extremities.  Skin: Warm and dry.   Intake/Output from previous day: 01/05 0701 - 01/06 0700 In: 3710 [P.O.:1258; I.V.:3] Out: 850 [Urine:850]    Lab Results: BMET    Component Value Date/Time   NA 135 02/02/2021 0135   NA 134 (L) 02/01/2021 0215   NA 138 01/31/2021 0218   K 4.1 02/02/2021 0135   K 3.5 02/01/2021 0215   K 4.0 01/31/2021 0218   CL 99 02/02/2021 0135   CL 97 (L) 02/01/2021 0215   CL 100 01/31/2021 0218   CO2 23 02/02/2021 0135   CO2 27 02/01/2021 0215   CO2 27 01/31/2021 0218   GLUCOSE 103 (H) 02/02/2021 0135   GLUCOSE 94 02/01/2021 0215   GLUCOSE 87 01/31/2021 0218   BUN 16 02/02/2021 0135   BUN 17 02/01/2021 0215   BUN 17 01/31/2021 0218   CREATININE 1.16 02/02/2021 0135   CREATININE 1.30 (H) 02/01/2021 0215   CREATININE 1.24 01/31/2021  0218   CREATININE 1.11 09/09/2014 1017   CALCIUM 8.9 02/02/2021 0135   CALCIUM 9.2 02/01/2021 0215   CALCIUM 9.1 01/31/2021 0218   GFRNONAA >60 02/02/2021 0135   GFRNONAA >60 02/01/2021 0215   GFRNONAA >60 01/31/2021 0218   GFRAA >60 07/30/2018 1115   GFRAA >60 12/23/2017 0915   GFRAA >60 08/04/2017 2006   CBC    Component Value Date/Time   WBC 12.5 (H) 02/02/2021 0135   RBC 6.85 (H) 02/02/2021 0135   HGB 18.2 (H) 02/02/2021 0135   HCT 57.8 (H) 02/02/2021 0135   PLT 397 02/02/2021 0135   MCV 84.4 02/02/2021 0135   MCH 26.6 02/02/2021 0135   MCHC 31.5 02/02/2021 0135   RDW 17.5 (H) 02/02/2021 0135   LYMPHSABS 5.2 (H) 01/27/2021 2002   MONOABS 0.7 01/27/2021 2002   EOSABS 0.3 01/27/2021 2002   BASOSABS 0.1 01/27/2021 2002   HEPATIC Function Panel Recent Labs    01/27/21 2002 02/01/21 0215  PROT 7.3 7.4   HEMOGLOBIN A1C No components found for: HGA1C,  MPG CARDIAC ENZYMES Lab Results  Component Value Date   TROPONINI <0.03 08/04/2017   TROPONINI 0.08 (H) 08/31/2014   TROPONINI 0.08 (H) 08/31/2014   BNP No results for input(s): PROBNP in the last 8760 hours. TSH Recent Labs    01/28/21 0246  TSH 1.003   CHOLESTEROL No results for input(s): CHOL in the  last 8760 hours.  Scheduled Meds:  aspirin EC  81 mg Oral Daily   atorvastatin  40 mg Oral Daily   carvedilol  6.25 mg Oral BID WC   chlorhexidine  15 mL Mouth Rinse BID   empagliflozin  10 mg Oral Daily   enoxaparin (LOVENOX) injection  80 mg Subcutaneous Q24H   furosemide  80 mg Intravenous Daily   influenza vac split quadrivalent PF  0.5 mL Intramuscular Tomorrow-1000   insulin aspart  0-15 Units Subcutaneous TID WC   insulin aspart  0-5 Units Subcutaneous QHS   insulin aspart  6 Units Subcutaneous TID WC   potassium chloride  20 mEq Oral Daily   sacubitril-valsartan  1 tablet Oral BID   sodium chloride flush  3 mL Intravenous Q12H   sodium chloride flush  3 mL Intravenous Q12H   sodium chloride  flush  3 mL Intravenous Q12H   Continuous Infusions:  sodium chloride     sodium chloride     PRN Meds:.sodium chloride, sodium chloride, acetaminophen, guaiFENesin-dextromethorphan, sodium chloride flush, sodium chloride flush  Assessment/Plan:  Acute on chronic systolic left heart failure Dilated cardiomyopathy HTN HLD Type 2 DM Morbid obesity  Plan: Continue medical therapy. F/U in 1 week post discharge.   LOS: 5 days   Time spent including chart review, lab review, examination, discussion with patient/Nurse : 30 min   Dixie Dials  MD  02/02/2021, 2:49 PM

## 2021-02-02 NOTE — Progress Notes (Signed)
Physical Therapy Treatment & Discharge Patient Details Name: John Conner MRN: [id number] DOB: Mar 25, 1960 Today's Date: 02/02/2021   History of Present Illness 61 yo male presents to [facility] on 12/31 with acute on chronic HF, out of medications x3 days. abnormal EKG with elevated troponins, trending troponins but attributed to likely demand ischemia. Pt with recent history of covid, now testing negative. S/p L heart cath and coronary angiography 02/01/21. PMH includes HLD, HTN, obesity, OSA on CPAP, carpal tunnel.    PT Comments    Pt appears to be and reports to be back to his baseline. He is capable of ambulating at a mod I level in his room without UE support, performing dynamic standing tasks, without LOB. Focused session on exercises to improve his balance and overall strength. Pt would benefit from a bariatric rollator at d/c for energy conservation and to reduce his risk for falls. All education completed and questions answered. Encouraged pt to continue to mobilize with staff and mobility specialists. He verbalized understanding and agreement to be d/c'd from acute PT services. PT will sign off.     Recommendations for follow up therapy are one component of a multi-disciplinary discharge planning process, led by the attending physician.  Recommendations may be updated based on patient status, additional functional criteria and insurance authorization.  Follow Up Recommendations  No PT follow up     Assistance Recommended at Discharge PRN  Patient can return home with the following     Equipment Recommendations  Rollator (4 wheels) (bariatric)    Recommendations for Other Services       Precautions / Restrictions Precautions Precautions: Fall Restrictions Weight Bearing Restrictions: No     Mobility  Bed Mobility Overal bed mobility: Modified Independent             General bed mobility comments: Pt able to perform all bed mobility aspects safely without assistance.     Transfers Overall transfer level: Needs assistance Equipment used: None Transfers: Sit to/from Stand Sit to Stand: Modified independent (Device/Increase time)           General transfer comment: Pt able to power up to stand safely without LOB.    Ambulation/Gait Ambulation/Gait assistance: Modified independent (Device/Increase time) Gait Distance (Feet): 35 Feet Assistive device: None Gait Pattern/deviations: Step-through pattern;Decreased stride length;Trunk flexed Gait velocity: fair Gait velocity interpretation: 1.31 - 2.62 ft/sec, indicative of limited community ambulator   General Gait Details: Pt with slightly flexed trunk due to chronic back pain. Able to ambulate around obstacles in room and reach off BOS to readjust bedding without LOB. Slightly slowed gait.   Stairs             Wheelchair Mobility    Modified Rankin (Stroke Patients Only)       Balance Overall balance assessment: Needs assistance;History of Falls Sitting-balance support: No upper extremity supported;Feet supported Sitting balance-Leahy Scale: Normal     Standing balance support: No upper extremity supported;Single extremity supported;Bilateral upper extremity supported Standing balance-Leahy Scale: Good Standing balance comment: Able to stand briefly in SLS without UE support and maintain static tandem and Rhomberg stance without UE support and eyes closed without LOB. Intermittently prefers UE support due to chronic L knee and back pain.     Tandem Stance - Right Leg: 5 (seconds, no UE support, eyes open and closed) Tandem Stance - Left Leg: 5 (seconds, no UE support, eyes open and closed) Rhomberg - Eyes Opened: 5 (seconds, no UE support) Rhomberg - Eyes Closed:  5 (seconds, no UE support)   High Level Balance Comments: Dynamic SLS, moving contralateral leg to perform exercises with and without UE support            Cognition Arousal/Alertness: Awake/alert Behavior During  Therapy: WFL for tasks assessed/performed Overall Cognitive Status: Within Functional Limits for tasks assessed                                          Exercises General Exercises - Lower Extremity Hip ABduction/ADduction: Strengthening;AROM;Both;10 reps;Standing (with and without UE support intermittently at sink) Heel Raises: AROM;Strengthening;Both;10 reps;Standing (with and without UE support intermittently at sink) Mini-Sqauts: AROM;Strengthening;Both;10 reps;Standing (with and without UE support intermittently at sink) Other Exercises Other Exercises: AROM hip extension bil with and without UE support intermittently at sink, 10x each Other Exercises: Toe taps on sideways trash can with and without UE support intermittently at sink, 10x each Other Exercises: Modified push-ups on sink surface, 10x Other Exercises: Tandem and Rhomberg stance ~5 sec each position without UE support, eyes open and closed    General Comments General comments (skin integrity, edema, etc.): Pt agreeable to d/c from acute PT services as he reports he is back to baseline; VSS on RA      Pertinent Vitals/Pain Pain Assessment: 0-10 Pain Score: 7  Pain Location: chronic Lt knee and back pain Pain Descriptors / Indicators: Aching Pain Intervention(s): Limited activity within patient's tolerance;Monitored during session;Repositioned    Home Living                          Prior Function            PT Goals (current goals can now be found in the care plan section) Acute Rehab PT Goals Patient Stated Goal: to go home PT Goal Formulation: All assessment and education complete, DC therapy Time For Goal Achievement: 02/12/21 Potential to Achieve Goals: Good Progress towards PT goals: Goals met/education completed, patient discharged from PT    Frequency    Min 3X/week      PT Plan Equipment recommendations need to be updated    Co-evaluation               AM-PAC PT "6 Clicks" Mobility   Outcome Measure  Help needed turning from your back to your side while in a flat bed without using bedrails?: None Help needed moving from lying on your back to sitting on the side of a flat bed without using bedrails?: None Help needed moving to and from a bed to a chair (including a wheelchair)?: None Help needed standing up from a chair using your arms (e.g., wheelchair or bedside chair)?: None Help needed to walk in hospital room?: None Help needed climbing 3-5 steps with a railing? : A Little 6 Click Score: 23    End of Session   Activity Tolerance: Patient tolerated treatment well Patient left: in bed;with call bell/phone within reach   PT Visit Diagnosis: Other abnormalities of gait and mobility (R26.89)     Time: 6759-1638 PT Time Calculation (min) (ACUTE ONLY): 11 min  Charges:  $Therapeutic Exercise: 8-22 mins                     Moishe Spice, PT, DPT Acute Rehabilitation Services  Pager: [phone] Office: [place] 02/02/2021, 4:59 PM

## 2021-02-02 NOTE — Progress Notes (Signed)
PROGRESS NOTE  Joaquin Knebel UXN:[id number] DOB: 1960/08/29 DOA: 01/27/2021 PCP: Jonathon Resides, MD  Brief History   61 year old male with medical history significant for HTN, HLD, type II DM, chronic systolic CHF, morbid obesity, OSA on CPAP, stopped taking his Lasix and HCTZ for approximately a week prior to admission due to insurance issues and presented with progressive dyspnea and leg edema.  He also developed cough on Christmas Day and tested positive for COVID-19 on 12/26, treated with Paxlovid.  In the ED hypoxic to 89% on room air.  Admitted for acute respiratory failure with hypoxia due to acute on chronic combined systolic and diastolic CHF, elevated troponins felt to be due to demand ischemia but cannot rule out ACS.    Pt underwent LHC. It demonstrated LVEF of 30-35%. No significant CAD noted. Medical management is recommended for risk reduction.   Consultants  Cardiology  Procedures  LHC  Antibiotics   Anti-infectives (From admission, onward)    None      Subjective  The patient is resting quietly in bed. No new complaints.   Objective   Vitals:  Vitals:   02/02/21 0854 02/02/21 1133  BP: 96/67 114/68  Pulse: 84 68  Resp:  16  Temp:    SpO2:     Exam:  Constitutional:  The patient is awake, alert, and oriented x 3. No acute distress.       Respiratory:  No increased work of breathing. No wheezes or rhonchi Positive for rales at bases. No tactile fremitus Cardiovascular:  Regular rate and rhythm No murmurs, ectopy, or gallups. No lateral PMI. No thrills. Abdomen:  Abdomen is soft, non-tender, non-distended No hernias, masses, or organomegaly Normoactive bowel sounds.  Musculoskeletal:  No cyanosis or clubbing Positive for 2-3 + pitting edema on lower extremities bilaterally. Skin:  No rashes, lesions, ulcers palpation of skin: no induration or nodules Neurologic:  CN 2-12 intact Sensation all 4 extremities intact Psychiatric:  Mental  status Mood, affect appropriate Orientation to person, place, time  judgment and insight appear intact  I have personally reviewed the following:   Today's Data  Vitals  Lab Data  CBC, CMP  Micro Data    Imaging    Cardiology Data  EKG Echocardiogram LHC  Other Data    Scheduled Meds:  aspirin EC  81 mg Oral Daily   atorvastatin  40 mg Oral Daily   carvedilol  6.25 mg Oral BID WC   chlorhexidine  15 mL Mouth Rinse BID   empagliflozin  10 mg Oral Daily   enoxaparin (LOVENOX) injection  80 mg Subcutaneous Q24H   furosemide  80 mg Intravenous Daily   influenza vac split quadrivalent PF  0.5 mL Intramuscular Tomorrow-1000   insulin aspart  0-15 Units Subcutaneous TID WC   insulin aspart  0-5 Units Subcutaneous QHS   insulin aspart  6 Units Subcutaneous TID WC   potassium chloride  20 mEq Oral Daily   sacubitril-valsartan  1 tablet Oral BID   sodium chloride flush  3 mL Intravenous Q12H   sodium chloride flush  3 mL Intravenous Q12H   sodium chloride flush  3 mL Intravenous Q12H   Continuous Infusions:  sodium chloride     sodium chloride      Principal Problem:   Acute on chronic diastolic heart failure (HCC) Active Problems:   Essential hypertension, benign   Hypocalcemia   OSA (obstructive sleep apnea)   Hypoxia   Morbid obesity with BMI of 50.0-59.9, adult (  [place])   NSTEMI (non-ST elevated myocardial infarction) ([place])   Diabetes mellitus type 2 in obese ([place])   Prolonged QT interval   LOS: 5 days   A & P  Acute on chronic combined systolic and diastolic CHF: 2D echo 03/01/252: LVEF 30-35%, LV with global hypokinesis and grade 2 diastolic dysfunction.  Moderate MR.  EF appears slightly worse than prior echo in 2016. Concern for ischemic cardiomyopathy with dilated cardiomyopathy IV Lasix was increased from 40 to 80 mg every 12 hours. -9.8 L since admission and weight down approximately 20 pounds. Remains significantly volume overloaded. Continue  aspirin, carvedilol, Entresto and Jardiance LHC completed. No intervention required. EF 30-35%.   Acute respiratory failure with hypoxia secondary to decompensated CHF: Had respiratory distress and oxygen saturations of 89% on room air on arrival to ED. Pt continues to require 3L [place] to maintain saturations in the 90's. Continue diuresis and to monitor volume status.   Elevated troponins Mildly elevated high-sensitivity troponins, peaked to 145.  No anginal symptoms. Suspect oxygen supply demand mismatch due to presentation with decompensated CHF and acute respiratory failure but ACS cannot be ruled out. Cardiology consulting and plan cardiac cath pending stabilization of volume status. LHC on 1/5 demonstrated no significant CAD with dilated CMO EF 30-35%.   Type II DM A1c 6.4.  Metformin on hold. Good control on SSI.   Essential hypertension Controlled. Continue carvedilol.  Entresto initiated.   OSA on CPAP Continue   Prolonged QTC EKG 12/31 showed QTC of 490 ms.  Most recent potassium is 4 and magnesium 2.  Repeat and follow EKG.  Minimize QT prolonging meds.   Mild dilatation of ascending aorta Noted on 2D echo 01/28/2021.  Measuring 41 mm with mild atheroma plaque involving the aortic root and ascending aorta. Outpatient follow-up.   Body mass index is 50.78 kg/m./Morbid obesity Needs lifestyle modification and weight loss.  I have seen and examined this patient myself. I have spent 34 minutes in his evaluation and care.   DVT prophylaxis: Place TED hose Start: 01/28/21 1212 Place TED hose Start: 01/28/21 1009     Code Status: Full Code Family Communication: None at bedside Disposition:  Status is: Inpatient   Ameen Mostafa, DO [facility] Direct contact: see [email or web address]  7PM-7AM contact night coverage as above 02/02/2021, 5:33 PM  LOS: 4 days

## 2021-02-02 NOTE — Progress Notes (Signed)
Mobility Specialist Progress Note    02/02/21 1420  Mobility  Activity Ambulated in hall  Level of Assistance Standby assist, set-up cues, supervision of patient - no hands on  Assistive Device Four wheel walker  Distance Ambulated (ft) 450 ft  Mobility Ambulated with assistance in hallway  Mobility Response Tolerated fair  Mobility performed by Mobility specialist  $Mobility charge 1 Mobility   Pre-Mobility: 81 HR, 90% SpO2 During Mobility: 108 HR Post-Mobility: 84 HR, 94% SpO2  Pt received sitting EOB and agreeable. Pt SOB with exertion. Returned to sitting EOB with call bell in reach. Ambulated on RA.  [facility] Mobility Specialist  M.S. 2C and 6E: [phone] M.S. 4E: [phone]

## 2021-02-03 LAB — CBC WITH DIFFERENTIAL/PLATELET
Abs Immature Granulocytes: 0 10*3/uL (ref 0.00–0.07)
Basophils Absolute: 0.1 10*3/uL (ref 0.0–0.1)
Basophils Relative: 1 %
Eosinophils Absolute: 0.1 10*3/uL (ref 0.0–0.5)
Eosinophils Relative: 1 %
HCT: 57.1 % — ABNORMAL HIGH (ref 39.0–52.0)
Hemoglobin: 18 g/dL — ABNORMAL HIGH (ref 13.0–17.0)
Lymphocytes Relative: 53 %
Lymphs Abs: 6.7 10*3/uL — ABNORMAL HIGH (ref 0.7–4.0)
MCH: 26.7 pg (ref 26.0–34.0)
MCHC: 31.5 g/dL (ref 30.0–36.0)
MCV: 84.8 fL (ref 80.0–100.0)
Monocytes Absolute: 0.5 10*3/uL (ref 0.1–1.0)
Monocytes Relative: 4 %
Neutro Abs: 5.2 10*3/uL (ref 1.7–7.7)
Neutrophils Relative %: 41 %
Platelets: 374 10*3/uL (ref 150–400)
RBC: 6.73 MIL/uL — ABNORMAL HIGH (ref 4.22–5.81)
RDW: 17.2 % — ABNORMAL HIGH (ref 11.5–15.5)
WBC: 12.6 10*3/uL — ABNORMAL HIGH (ref 4.0–10.5)
nRBC: 0 % (ref 0.0–0.2)
nRBC: 0 /100 WBC

## 2021-02-03 LAB — GLUCOSE, CAPILLARY
Glucose-Capillary: 133 mg/dL — ABNORMAL HIGH (ref 70–99)
Glucose-Capillary: 119 mg/dL — ABNORMAL HIGH (ref 70–99)
Glucose-Capillary: 137 mg/dL — ABNORMAL HIGH (ref 70–99)
Glucose-Capillary: 129 mg/dL — ABNORMAL HIGH (ref 70–99)

## 2021-02-03 MED ORDER — GUAIFENESIN-CODEINE 100-10 MG/5ML PO SOLN
5.0000 mL | ORAL | Status: DC | PRN
  Administered 2021-02-03 – 2021-02-05 (×5): 5 mL via ORAL
  Filled 2021-02-03 (×5): qty 5

## 2021-02-03 MED ORDER — GUAIFENESIN-CODEINE 100-10 MG/5ML PO SOLN: 5.0000 mL | ORAL | Status: DC | PRN

## 2021-02-03 NOTE — Progress Notes (Signed)
Pt placed themself on CPAP for the evening. RT will cont to monitor.

## 2021-02-03 NOTE — Progress Notes (Signed)
Pt is on 3L of O2 via [place] satting low 90s. Pt desat to 82% on RA.  Reapplied O2.  Idolina Primer, RN

## 2021-02-03 NOTE — Progress Notes (Signed)
PROGRESS NOTE  John Conner QQV:[id number] DOB: 09/15/1960 DOA: 01/27/2021 PCP: Jonathon Resides, MD  Brief History   61 year old male with medical history significant for HTN, HLD, type II DM, chronic systolic CHF, morbid obesity, OSA on CPAP, stopped taking his Lasix and HCTZ for approximately a week prior to admission due to insurance issues and presented with progressive dyspnea and leg edema.  He also developed cough on Christmas Day and tested positive for COVID-19 on 12/26, treated with Paxlovid.  In the ED hypoxic to 89% on room air.  Admitted for acute respiratory failure with hypoxia due to acute on chronic combined systolic and diastolic CHF, elevated troponins felt to be due to demand ischemia but cannot rule out ACS.    Pt underwent LHC. It demonstrated LVEF of 30-35%. No significant CAD noted. Medical management is recommended for risk reduction.   Consultants  Cardiology  Procedures  LHC  Antibiotics   Anti-infectives (From admission, onward)    None      Subjective  The patient is resting quietly in bed. No new complaints.   Objective   Vitals:  Vitals:   02/03/21 1047 02/03/21 1803  BP: 99/77 109/75  Pulse: 70 73  Resp:    Temp:    SpO2: 93%    Exam:  Constitutional:  The patient is awake, alert, and oriented x 3. No acute distress.       Respiratory:  No increased work of breathing. No wheezes or rhonchi Positive for rales at bases. No tactile fremitus Cardiovascular:  Regular rate and rhythm No murmurs, ectopy, or gallups. No lateral PMI. No thrills. Abdomen:  Abdomen is soft, non-tender, non-distended No hernias, masses, or organomegaly Normoactive bowel sounds.  Musculoskeletal:  No cyanosis or clubbing Positive for 2-3 + pitting edema on lower extremities bilaterally. Skin:  No rashes, lesions, ulcers palpation of skin: no induration or nodules Neurologic:  CN 2-12 intact Sensation all 4 extremities intact Psychiatric:  Mental  status Mood, affect appropriate Orientation to person, place, time  judgment and insight appear intact  I have personally reviewed the following:   Today's Data  Vitals  Lab Data  CBC, CMP  Micro Data    Imaging    Cardiology Data  EKG Echocardiogram LHC  Other Data    Scheduled Meds:  aspirin EC  81 mg Oral Daily   atorvastatin  40 mg Oral Daily   carvedilol  6.25 mg Oral BID WC   chlorhexidine  15 mL Mouth Rinse BID   empagliflozin  10 mg Oral Daily   enoxaparin (LOVENOX) injection  80 mg Subcutaneous Q24H   furosemide  80 mg Intravenous Daily   influenza vac split quadrivalent PF  0.5 mL Intramuscular Tomorrow-1000   insulin aspart  0-15 Units Subcutaneous TID WC   insulin aspart  0-5 Units Subcutaneous QHS   insulin aspart  6 Units Subcutaneous TID WC   potassium chloride  20 mEq Oral Daily   sacubitril-valsartan  1 tablet Oral BID   sodium chloride flush  3 mL Intravenous Q12H   sodium chloride flush  3 mL Intravenous Q12H   sodium chloride flush  3 mL Intravenous Q12H   Continuous Infusions:  sodium chloride     sodium chloride      Principal Problem:   Acute on chronic diastolic heart failure (HCC) Active Problems:   Essential hypertension, benign   Hypocalcemia   OSA (obstructive sleep apnea)   Hypoxia   Morbid obesity with BMI of 50.0-59.9, adult (  [place])   NSTEMI (non-ST elevated myocardial infarction) ([place])   Diabetes mellitus type 2 in obese ([place])   Prolonged QT interval   LOS: 6 days   A & P  Acute on chronic combined systolic and diastolic CHF: 2D echo 09/04/5025: LVEF 30-35%, LV with global hypokinesis and grade 2 diastolic dysfunction.  Moderate MR.  EF appears slightly worse than prior echo in 2016. Concern for ischemic cardiomyopathy with dilated cardiomyopathy IV Lasix was increased from 40 to 80 mg every 12 hours. -9.8 L since admission and weight down approximately 20 pounds. Remains significantly volume overloaded. Continue  aspirin, carvedilol, Entresto and Jardiance LHC completed. No intervention required. EF 30-35%. Will continue diuresis. Pt is currently negative 13.77 liters from admission, but still with significant lower extremity edema.   Acute respiratory failure with hypoxia secondary to decompensated CHF: Had respiratory distress and oxygen saturations of 89% on room air on arrival to ED. Pt continues to require 3L [place] to maintain saturations in the 90's. Continue diuresis and to monitor volume status. Wean O2 as possible.   Elevated troponins Mildly elevated high-sensitivity troponins, peaked to 145.  No anginal symptoms. Suspect oxygen supply demand mismatch due to presentation with decompensated CHF and acute respiratory failure but ACS cannot be ruled out. Cardiology consulting and plan cardiac cath pending stabilization of volume status. LHC on 1/5 demonstrated no significant CAD with dilated CMO EF 30-35%.   Type II DM A1c 6.4.  Metformin on hold. Good control on SSI.   Essential hypertension Controlled. Continue carvedilol.  Entresto initiated.   OSA on CPAP Continue   Prolonged QTC EKG 12/31 showed QTC of 490 ms.  Most recent potassium is 4 and magnesium 2.  Repeat and follow EKG.  Minimize QT prolonging meds.   Mild dilatation of ascending aorta Noted on 2D echo 01/28/2021.  Measuring 41 mm with mild atheroma plaque involving the aortic root and ascending aorta. Outpatient follow-up.   Body mass index is 50.78 kg/m./Morbid obesity Needs lifestyle modification and weight loss.  I have seen and examined this patient myself. I have spent 34 minutes in his evaluation and care.   DVT prophylaxis: Place TED hose Start: 01/28/21 1212 Place TED hose Start: 01/28/21 1009     Code Status: Full Code Family Communication: None at bedside Disposition:  Status is: Inpatient   John Fickel, DO [facility] Direct contact: see [email or web address]  7PM-7AM contact night coverage as  above 02/03/2021, 7:18 PM  LOS: 4 days

## 2021-02-03 NOTE — Consult Note (Signed)
Ref: Jonathon Resides, MD   Subjective:  Awake. Sitting up, needs supplemental oxygen.  Objective:  Vital Signs in the last 24 hours: Temp:  [97.4 F (36.3 C)-97.8 F (36.6 C)] 97.4 F (36.3 C) (01/07 0557) Pulse Rate:  [62-83] 70 (01/07 1047) Cardiac Rhythm: Heart block (01/07 0808) Resp:  [16-18] 16 (01/07 0557) BP: (98-113)/(67-89) 99/77 (01/07 1047) SpO2:  [82 %-95 %] 93 % (01/07 1047) Weight:  [159.4 kg] 159.4 kg (01/07 0557)  Physical Exam: BP Readings from Last 1 Encounters:  02/03/21 99/77     Wt Readings from Last 1 Encounters:  02/03/21 (!) 159.4 kg    Weight change: -0.227 kg Body mass index is 50.43 kg/m. HEENT: [place]/AT, Eyes-Brown, Conjunctiva-Pink, Sclera-Non-icteric Neck: No JVD, No bruit, Trachea midline. Lungs:  Clear,ing Bilateral. Cardiac:  Regular rhythm, normal S1 and S2, no S3. II/VI systolic murmur. Abdomen:  Soft, non-tender. BS present. Extremities:  Trace edema present. No cyanosis. No clubbing. CNS: AxOx3, Cranial nerves grossly intact, moves all 4 extremities.  Skin: Warm and dry.   Intake/Output from previous day: 01/06 0701 - 01/07 0700 In: 706 [P.O.:700; I.V.:6] Out: 4854 [Urine:3175]    Lab Results: BMET    Component Value Date/Time   NA 135 02/02/2021 0135   NA 134 (L) 02/01/2021 0215   NA 138 01/31/2021 0218   K 4.1 02/02/2021 0135   K 3.5 02/01/2021 0215   K 4.0 01/31/2021 0218   CL 99 02/02/2021 0135   CL 97 (L) 02/01/2021 0215   CL 100 01/31/2021 0218   CO2 23 02/02/2021 0135   CO2 27 02/01/2021 0215   CO2 27 01/31/2021 0218   GLUCOSE 103 (H) 02/02/2021 0135   GLUCOSE 94 02/01/2021 0215   GLUCOSE 87 01/31/2021 0218   BUN 16 02/02/2021 0135   BUN 17 02/01/2021 0215   BUN 17 01/31/2021 0218   CREATININE 1.16 02/02/2021 0135   CREATININE 1.30 (H) 02/01/2021 0215   CREATININE 1.24 01/31/2021 0218   CREATININE 1.11 09/09/2014 1017   CALCIUM 8.9 02/02/2021 0135   CALCIUM 9.2 02/01/2021 0215   CALCIUM 9.1 01/31/2021  0218   GFRNONAA >60 02/02/2021 0135   GFRNONAA >60 02/01/2021 0215   GFRNONAA >60 01/31/2021 0218   GFRAA >60 07/30/2018 1115   GFRAA >60 12/23/2017 0915   GFRAA >60 08/04/2017 2006   CBC    Component Value Date/Time   WBC 12.6 (H) 02/03/2021 0224   RBC 6.73 (H) 02/03/2021 0224   HGB 18.0 (H) 02/03/2021 0224   HCT 57.1 (H) 02/03/2021 0224   PLT 374 02/03/2021 0224   MCV 84.8 02/03/2021 0224   MCH 26.7 02/03/2021 0224   MCHC 31.5 02/03/2021 0224   RDW 17.2 (H) 02/03/2021 0224   LYMPHSABS 6.7 (H) 02/03/2021 0224   MONOABS 0.5 02/03/2021 0224   EOSABS 0.1 02/03/2021 0224   BASOSABS 0.1 02/03/2021 0224   HEPATIC Function Panel Recent Labs    01/27/21 2002 02/01/21 0215  PROT 7.3 7.4   HEMOGLOBIN A1C No components found for: HGA1C,  MPG CARDIAC ENZYMES Lab Results  Component Value Date   TROPONINI <0.03 08/04/2017   TROPONINI 0.08 (H) 08/31/2014   TROPONINI 0.08 (H) 08/31/2014   BNP No results for input(s): PROBNP in the last 8760 hours. TSH Recent Labs    01/28/21 0246  TSH 1.003   CHOLESTEROL No results for input(s): CHOL in the last 8760 hours.  Scheduled Meds:  aspirin EC  81 mg Oral Daily   atorvastatin  40 mg Oral Daily   carvedilol  6.25 mg Oral BID WC   chlorhexidine  15 mL Mouth Rinse BID   empagliflozin  10 mg Oral Daily   enoxaparin (LOVENOX) injection  80 mg Subcutaneous Q24H   furosemide  80 mg Intravenous Daily   influenza vac split quadrivalent PF  0.5 mL Intramuscular Tomorrow-1000   insulin aspart  0-15 Units Subcutaneous TID WC   insulin aspart  0-5 Units Subcutaneous QHS   insulin aspart  6 Units Subcutaneous TID WC   potassium chloride  20 mEq Oral Daily   sacubitril-valsartan  1 tablet Oral BID   sodium chloride flush  3 mL Intravenous Q12H   sodium chloride flush  3 mL Intravenous Q12H   sodium chloride flush  3 mL Intravenous Q12H   Continuous Infusions:  sodium chloride     sodium chloride     PRN Meds:.sodium chloride,  sodium chloride, acetaminophen, guaiFENesin-dextromethorphan, sodium chloride flush, sodium chloride flush  Assessment/Plan:  Acute on chronic systolic left heat failure  Acute respiratory failure with hypoxia Dilated cardiomyopathy HTN HLD Type 2 DM Morbid obesity  Plan: May benefit from home O2.   LOS: 6 days   Time spent including chart review, lab review, examination, discussion with patient/Nurse : 30 min   Dixie Dials  MD  02/03/2021, 11:47 AM

## 2021-02-03 NOTE — Progress Notes (Signed)
Mobility Specialist Progress Note:   02/03/21 1351  Mobility  Activity Ambulated in hall  Level of Assistance Standby assist, set-up cues, supervision of patient - no hands on  Assistive Device Front wheel walker  Distance Ambulated (ft) 400 ft  Mobility Ambulated with assistance in hallway  Mobility Response Tolerated well  Mobility performed by Mobility specialist  $Mobility charge 1 Mobility   Pt received in bed willing to participate in mobility. No complaints of pain. Pt a little SOB when returning to room. Left EOB with call bell in reach and all needs met.   [facility] Public librarian Phone [phone] Secondary Phone [phone]

## 2021-02-04 LAB — CBC WITH DIFFERENTIAL/PLATELET
Abs Immature Granulocytes: 0.05 10*3/uL (ref 0.00–0.07)
Basophils Absolute: 0.1 10*3/uL (ref 0.0–0.1)
Basophils Relative: 1 %
Eosinophils Absolute: 0.3 10*3/uL (ref 0.0–0.5)
Eosinophils Relative: 3 %
HCT: 57.1 % — ABNORMAL HIGH (ref 39.0–52.0)
Hemoglobin: 18 g/dL — ABNORMAL HIGH (ref 13.0–17.0)
Immature Granulocytes: 0 %
Lymphocytes Relative: 44 %
Lymphs Abs: 5.2 10*3/uL — ABNORMAL HIGH (ref 0.7–4.0)
MCH: 26.7 pg (ref 26.0–34.0)
MCHC: 31.5 g/dL (ref 30.0–36.0)
MCV: 84.6 fL (ref 80.0–100.0)
Monocytes Absolute: 0.9 10*3/uL (ref 0.1–1.0)
Monocytes Relative: 8 %
Neutro Abs: 5.1 10*3/uL (ref 1.7–7.7)
Neutrophils Relative %: 44 %
Platelets: 363 10*3/uL (ref 150–400)
RBC: 6.75 MIL/uL — ABNORMAL HIGH (ref 4.22–5.81)
RDW: 17.1 % — ABNORMAL HIGH (ref 11.5–15.5)
WBC: 11.5 10*3/uL — ABNORMAL HIGH (ref 4.0–10.5)
nRBC: 0 % (ref 0.0–0.2)

## 2021-02-04 LAB — GLUCOSE, CAPILLARY
Glucose-Capillary: 109 mg/dL — ABNORMAL HIGH (ref 70–99)
Glucose-Capillary: 122 mg/dL — ABNORMAL HIGH (ref 70–99)
Glucose-Capillary: 101 mg/dL — ABNORMAL HIGH (ref 70–99)
Glucose-Capillary: 113 mg/dL — ABNORMAL HIGH (ref 70–99)

## 2021-02-04 LAB — BASIC METABOLIC PANEL
Anion gap: 11 (ref 5–15)
BUN: 15 mg/dL (ref 6–20)
CO2: 25 mmol/L (ref 22–32)
Calcium: 8.8 mg/dL — ABNORMAL LOW (ref 8.9–10.3)
Chloride: 100 mmol/L (ref 98–111)
Creatinine, Ser: 1.23 mg/dL (ref 0.61–1.24)
GFR, Estimated: >60 mL/min (ref >60)
Glucose, Bld: 83 mg/dL (ref 70–99)
Potassium: 4 mmol/L (ref 3.5–5.1)
Sodium: 136 mmol/L (ref 135–145)

## 2021-02-04 MED ORDER — MAGNESIUM SULFATE 2 GM/50ML IV SOLN
2.0000 g | Freq: Once | INTRAVENOUS | Status: AC
  Administered 2021-02-04: 2 g via INTRAVENOUS
  Filled 2021-02-04: qty 50

## 2021-02-04 MED ORDER — SALINE SPRAY 0.65 % NA SOLN: 2.0000 | NASAL | Status: DC

## 2021-02-04 MED ORDER — FLUTICASONE PROPIONATE 50 MCG/ACT NA SUSP
2.0000 | Freq: Every day | NASAL | Status: DC
  Administered 2021-02-04 – 2021-02-07 (×4): 2 via NASAL
  Filled 2021-02-04: qty 16

## 2021-02-04 NOTE — Progress Notes (Signed)
Patient had 18 beat run of VT.  Found to be sitting on side of bed.  Denies CP/dizziness/palpitations.  BP 110/75, HR 70 NSR.  Dr. Doylene Canard notified and order for 2g Magnesium Sulfate IV and recheck Mg with am labs.  Patient updated on plan of care.

## 2021-02-04 NOTE — Consult Note (Signed)
Ref: Jonathon Resides, MD   Subjective:  Awake. On 2 L/Min oxygen. O2 sat 95 to 98 %.  He had episode of 18 beat VT. His magnesium level has not been checked since 01/27/2021 and has received several rounds of IV lasix with good diuresis.  Patient admits to eating Ice and has little weight loss in last 5 days.  Objective:  Vital Signs in the last 24 hours: Temp:  [97.9 F (36.6 C)-98.6 F (37 C)] 98.6 F (37 C) (01/08 1631) Pulse Rate:  [68-86] 76 (01/08 1800) Cardiac Rhythm: Heart block (01/08 1300) Resp:  [18-19] 18 (01/08 1631) BP: (108-119)/(75-95) 110/75 (01/08 1631) SpO2:  [92 %-99 %] 98 % (01/08 1800) Weight:  [158.8 kg] 158.8 kg (01/08 0547)  Physical Exam: BP Readings from Last 1 Encounters:  02/04/21 110/75     Wt Readings from Last 1 Encounters:  02/04/21 (!) 158.8 kg    Weight change: -0.635 kg Body mass index is 50.23 kg/m. HEENT: [place]/AT, Eyes-Brown, Conjunctiva-Pink, Sclera-Non-icteric Neck: No JVD, No bruit, Trachea midline. Lungs:  Clear, Bilateral. Cardiac:  Regular rhythm, normal S1 and S2, no S3. II/VI systolic murmur. Abdomen:  Soft, non-tender. BS present. Extremities:  1 + lower leg edema present. No cyanosis. No clubbing. CNS: AxOx3, Cranial nerves grossly intact, moves all 4 extremities.  Skin: Warm and dry.   Intake/Output from previous day: 01/07 0701 - 01/08 0700 In: 1233 [P.O.:1224; I.V.:9] Out: 1448 [Urine:1765]    Lab Results: BMET    Component Value Date/Time   NA 136 02/04/2021 0235   NA 135 02/02/2021 0135   NA 134 (L) 02/01/2021 0215   K 4.0 02/04/2021 0235   K 4.1 02/02/2021 0135   K 3.5 02/01/2021 0215   CL 100 02/04/2021 0235   CL 99 02/02/2021 0135   CL 97 (L) 02/01/2021 0215   CO2 25 02/04/2021 0235   CO2 23 02/02/2021 0135   CO2 27 02/01/2021 0215   GLUCOSE 83 02/04/2021 0235   GLUCOSE 103 (H) 02/02/2021 0135   GLUCOSE 94 02/01/2021 0215   BUN 15 02/04/2021 0235   BUN 16 02/02/2021 0135   BUN 17 02/01/2021 0215    CREATININE 1.23 02/04/2021 0235   CREATININE 1.16 02/02/2021 0135   CREATININE 1.30 (H) 02/01/2021 0215   CREATININE 1.11 09/09/2014 1017   CALCIUM 8.8 (L) 02/04/2021 0235   CALCIUM 8.9 02/02/2021 0135   CALCIUM 9.2 02/01/2021 0215   GFRNONAA >60 02/04/2021 0235   GFRNONAA >60 02/02/2021 0135   GFRNONAA >60 02/01/2021 0215   GFRAA >60 07/30/2018 1115   GFRAA >60 12/23/2017 0915   GFRAA >60 08/04/2017 2006   CBC    Component Value Date/Time   WBC 11.5 (H) 02/04/2021 0235   RBC 6.75 (H) 02/04/2021 0235   HGB 18.0 (H) 02/04/2021 0235   HCT 57.1 (H) 02/04/2021 0235   PLT 363 02/04/2021 0235   MCV 84.6 02/04/2021 0235   MCH 26.7 02/04/2021 0235   MCHC 31.5 02/04/2021 0235   RDW 17.1 (H) 02/04/2021 0235   LYMPHSABS 5.2 (H) 02/04/2021 0235   MONOABS 0.9 02/04/2021 0235   EOSABS 0.3 02/04/2021 0235   BASOSABS 0.1 02/04/2021 0235   HEPATIC Function Panel Recent Labs    01/27/21 2002 02/01/21 0215  PROT 7.3 7.4   HEMOGLOBIN A1C No components found for: HGA1C,  MPG CARDIAC ENZYMES Lab Results  Component Value Date   TROPONINI <0.03 08/04/2017   TROPONINI 0.08 (H) 08/31/2014   TROPONINI 0.08 (H) 08/31/2014  BNP No results for input(s): PROBNP in the last 8760 hours. TSH Recent Labs    01/28/21 0246  TSH 1.003   CHOLESTEROL No results for input(s): CHOL in the last 8760 hours.  Scheduled Meds:  aspirin EC  81 mg Oral Daily   atorvastatin  40 mg Oral Daily   carvedilol  6.25 mg Oral BID WC   chlorhexidine  15 mL Mouth Rinse BID   empagliflozin  10 mg Oral Daily   enoxaparin (LOVENOX) injection  80 mg Subcutaneous Q24H   fluticasone  2 spray Each Nare Daily   furosemide  80 mg Intravenous Daily   influenza vac split quadrivalent PF  0.5 mL Intramuscular Tomorrow-1000   insulin aspart  0-15 Units Subcutaneous TID WC   insulin aspart  0-5 Units Subcutaneous QHS   insulin aspart  6 Units Subcutaneous TID WC   potassium chloride  20 mEq Oral Daily    sacubitril-valsartan  1 tablet Oral BID   sodium chloride flush  3 mL Intravenous Q12H   sodium chloride flush  3 mL Intravenous Q12H   sodium chloride flush  3 mL Intravenous Q12H   Continuous Infusions:  sodium chloride     sodium chloride     magnesium sulfate bolus IVPB 50 mL/hr at 02/04/21 1807   PRN Meds:.sodium chloride, sodium chloride, acetaminophen, guaiFENesin-codeine, sodium chloride flush, sodium chloride flush  Assessment/Plan:  Non-sustained VT Acute on chronic systolic left heart failure Acute respiratory failure with hypoxia Dilated cardiomyopathy HTN HLD Type 2 DM Morbid obesity  Plan: IV magnesium with BMET and magnesium level in AM. Discussed importance of fluid restriction with patient.   LOS: 7 days   Time spent including chart review, lab review, examination, discussion with patient/Nurse : 30 min   Dixie Dials  MD  02/04/2021, 6:12 PM

## 2021-02-04 NOTE — Progress Notes (Signed)
PROGRESS NOTE  John Conner GEZ:[id number] DOB: [birth year] DOA: 01/27/2021 PCP: Jonathon Resides, MD  Brief History   61 year old male with medical history significant for HTN, HLD, type II DM, chronic systolic CHF, morbid obesity, OSA on CPAP, stopped taking his Lasix and HCTZ for approximately a week prior to admission due to insurance issues and presented with progressive dyspnea and leg edema.  He also developed cough on Christmas Day and tested positive for COVID-19 on 12/26, treated with Paxlovid.  In the ED hypoxic to 89% on room air.  Admitted for acute respiratory failure with hypoxia due to acute on chronic combined systolic and diastolic CHF, elevated troponins felt to be due to demand ischemia but cannot rule out ACS.    Pt underwent LHC. It demonstrated LVEF of 30-35%. No significant CAD noted. Medical management is recommended for risk reduction.   Today the patient is complaining of some sinus congestion and some post-nasal drip.   Consultants  Cardiology  Procedures  LHC  Antibiotics   Anti-infectives (From admission, onward)    None      Subjective  The patient is resting quietly in bed. No new complaints.   Objective   Vitals:  Vitals:   02/04/21 1631 02/04/21 1800  BP: 110/75   Pulse: 70 76  Resp: 18   Temp: 98.6 F (37 C)   SpO2: 99% 98%   Exam:  Constitutional:  The patient is awake, alert, and oriented x 3. No acute distress.       Respiratory:  No increased work of breathing. No wheezes or rhonchi No rales No tactile fremitus Cardiovascular:  Regular rate and rhythm No murmurs, ectopy, or gallups. No lateral PMI. No thrills. Abdomen:  Abdomen is soft, non-tender, non-distended No hernias, masses, or organomegaly Normoactive bowel sounds.  Musculoskeletal:  No cyanosis or clubbing Positive for 2+ pitting edema on lower extremities bilaterally. Skin:  No rashes, lesions, ulcers palpation of skin: no induration or nodules Neurologic:   CN 2-12 intact Sensation all 4 extremities intact Psychiatric:  Mental status Mood, affect appropriate Orientation to person, place, time  judgment and insight appear intact  I have personally reviewed the following:   Today's Data  Vitals  Lab Data  CBC, CMP  Micro Data    Imaging    Cardiology Data  EKG Echocardiogram LHC  Other Data    Scheduled Meds:  aspirin EC  81 mg Oral Daily   atorvastatin  40 mg Oral Daily   carvedilol  6.25 mg Oral BID WC   chlorhexidine  15 mL Mouth Rinse BID   empagliflozin  10 mg Oral Daily   enoxaparin (LOVENOX) injection  80 mg Subcutaneous Q24H   fluticasone  2 spray Each Nare Daily   furosemide  80 mg Intravenous Daily   influenza vac split quadrivalent PF  0.5 mL Intramuscular Tomorrow-1000   insulin aspart  0-15 Units Subcutaneous TID WC   insulin aspart  0-5 Units Subcutaneous QHS   insulin aspart  6 Units Subcutaneous TID WC   potassium chloride  20 mEq Oral Daily   sacubitril-valsartan  1 tablet Oral BID   sodium chloride flush  3 mL Intravenous Q12H   sodium chloride flush  3 mL Intravenous Q12H   sodium chloride flush  3 mL Intravenous Q12H   Continuous Infusions:  sodium chloride     sodium chloride     magnesium sulfate bolus IVPB 50 mL/hr at 02/04/21 1807    Principal Problem:   Acute  on chronic diastolic heart failure (HCC) Active Problems:   Essential hypertension, benign   Hypocalcemia   OSA (obstructive sleep apnea)   Hypoxia   Morbid obesity with BMI of 50.0-59.9, adult (HCC)   NSTEMI (non-ST elevated myocardial infarction) ([place])   Diabetes mellitus type 2 in obese (HCC)   Prolonged QT interval   LOS: 7 days   A & P  Acute on chronic combined systolic and diastolic CHF: 2D echo 01/30/7515: LVEF 30-35%, LV with global hypokinesis and grade 2 diastolic dysfunction.  Moderate MR.  EF appears slightly worse than prior echo in 2016. Concern for ischemic cardiomyopathy with dilated cardiomyopathy IV  Lasix was increased from 40 to 80 mg every 12 hours. -9.8 L since admission and weight down approximately 20 pounds. Remains significantly volume overloaded. Continue aspirin, carvedilol, Entresto and Jardiance LHC completed. No intervention required. EF 30-35%. Will continue diuresis. Pt is currently negative 13.77 liters from admission, but still with significant lower extremity edema.   Acute respiratory failure with hypoxia secondary to decompensated CHF: Had respiratory distress and oxygen saturations of 89% on room air on arrival to ED. Pt continues to require 3L [place] to maintain saturations in the 90's. Continue diuresis and to monitor volume status. Wean O2 as possible.  Sinus congestion/cough: Flonase started. See no signs of infection. Monitor. Pt is getting robitussin with codeine. I see no need for discharge on this medication.   Elevated troponins Mildly elevated high-sensitivity troponins, peaked to 145.  No anginal symptoms. Suspect oxygen supply demand mismatch due to presentation with decompensated CHF and acute respiratory failure but ACS cannot be ruled out. Cardiology consulting and plan cardiac cath pending stabilization of volume status. LHC on 1/5 demonstrated no significant CAD with dilated CMO EF 30-35%.   Type II DM A1c 6.4.  Metformin on hold. Good control on SSI.   Essential hypertension Controlled. Continue carvedilol.  Entresto initiated.   OSA on CPAP Continue   Prolonged QTC EKG 12/31 showed QTC of 490 ms.  Most recent potassium is 4 and magnesium 2.  Repeat and follow EKG.  Minimize QT prolonging meds.   Mild dilatation of ascending aorta Noted on 2D echo 01/28/2021.  Measuring 41 mm with mild atheroma plaque involving the aortic root and ascending aorta. Outpatient follow-up.   Body mass index is 50.78 kg/m./Morbid obesity Needs lifestyle modification and weight loss.  I have seen and examined this patient myself. I have spent 32 minutes in his  evaluation and care.   DVT prophylaxis: Place TED hose Start: 01/28/21 1212 Place TED hose Start: 01/28/21 1009     Code Status: Full Code Family Communication: None at bedside Disposition:  Status is: Inpatient   Londen Lorge, DO [facility] Direct contact: see [email or web address]  7PM-7AM contact night coverage as above 02/04/2021, 6:37 PM  LOS: 4 days

## 2021-02-04 NOTE — Progress Notes (Signed)
Mobility Specialist Progress Note:   02/04/21 1133  Mobility  Activity Ambulated in hall  Level of [place] wheel walker  Distance Ambulated (ft) 450 ft  Mobility Ambulated with assistance in hallway  Mobility Response Tolerated well  Mobility performed by Mobility specialist  $Mobility charge 1 Mobility   Pt received EOB willing to participate in mobility. No complaints of pain. Pt returned to EOB with call bell in reach and all needs met.   [facility] Public librarian Phone [phone] Secondary Phone [phone]

## 2021-02-05 LAB — BASIC METABOLIC PANEL
Anion gap: 13 (ref 5–15)
BUN: 14 mg/dL (ref 6–20)
CO2: 22 mmol/L (ref 22–32)
Calcium: 8.8 mg/dL — ABNORMAL LOW (ref 8.9–10.3)
Chloride: 100 mmol/L (ref 98–111)
Creatinine, Ser: 1.12 mg/dL (ref 0.61–1.24)
GFR, Estimated: >60 mL/min (ref >60)
Glucose, Bld: 90 mg/dL (ref 70–99)
Potassium: 4.2 mmol/L (ref 3.5–5.1)
Sodium: 135 mmol/L (ref 135–145)

## 2021-02-05 LAB — GLUCOSE, CAPILLARY
Glucose-Capillary: 134 mg/dL — ABNORMAL HIGH (ref 70–99)
Glucose-Capillary: 117 mg/dL — ABNORMAL HIGH (ref 70–99)
Glucose-Capillary: 107 mg/dL — ABNORMAL HIGH (ref 70–99)
Glucose-Capillary: 114 mg/dL — ABNORMAL HIGH (ref 70–99)

## 2021-02-05 LAB — MAGNESIUM: Magnesium: 2.8 mg/dL — ABNORMAL HIGH (ref 1.7–2.4)

## 2021-02-05 MED ORDER — FUROSEMIDE 40 MG PO TABS
60.0000 mg | ORAL_TABLET | Freq: Two times a day (BID) | ORAL | Status: DC
  Administered 2021-02-05 – 2021-02-07 (×4): 60 mg via ORAL
  Filled 2021-02-05 (×4): qty 1

## 2021-02-05 NOTE — Progress Notes (Signed)
PROGRESS NOTE  John Conner WUJ:[id number] DOB: 06-15-1960 DOA: 01/27/2021 PCP: Jonathon Resides, MD  Brief History   61 year old male with medical history significant for HTN, HLD, type II DM, chronic systolic CHF, morbid obesity, OSA on CPAP, stopped taking his Lasix and HCTZ for approximately a week prior to admission due to insurance issues and presented with progressive dyspnea and leg edema.  He also developed cough on Christmas Day and tested positive for COVID-19 on 12/26, treated with Paxlovid.  In the ED hypoxic to 89% on room air.  Admitted for acute respiratory failure with hypoxia due to acute on chronic combined systolic and diastolic CHF, elevated troponins felt to be due to demand ischemia but cannot rule out ACS.    Pt underwent LHC. It demonstrated LVEF of 30-35%. No significant CAD noted. Medical management is recommended for risk reduction.   The patient has been transitioned to oral lasix 60 mg bid.   Consultants  Cardiology  Procedures  LHC  Antibiotics   Anti-infectives (From admission, onward)    None      Subjective  The patient is resting quietly in bed. No new complaints.   Objective   Vitals:  Vitals:   02/05/21 1435 02/05/21 1445  BP:    Pulse: 78   Resp: 19   Temp: (!) 97.4 F (36.3 C)   SpO2:  92%   Exam:  Constitutional:  The patient is awake, alert, and oriented x 3. No acute distress.       Respiratory:  No increased work of breathing. No wheezes or rhonchi No rales No tactile fremitus Cardiovascular:  Regular rate and rhythm No murmurs, ectopy, or gallups. No lateral PMI. No thrills. Abdomen:  Abdomen is soft, non-tender, non-distended No hernias, masses, or organomegaly Normoactive bowel sounds.  Musculoskeletal:  No cyanosis or clubbing Positive for 1-2+ pitting edema on lower extremities bilaterally. Skin:  No rashes, lesions, ulcers palpation of skin: no induration or nodules Neurologic:  CN 2-12 intact Sensation  all 4 extremities intact Psychiatric:  Mental status Mood, affect appropriate Orientation to person, place, time  judgment and insight appear intact  I have personally reviewed the following:   Today's Data  Vitals  Lab Data  CBC, CMP  Micro Data    Imaging    Cardiology Data  EKG Echocardiogram LHC  Other Data    Scheduled Meds:  aspirin EC  81 mg Oral Daily   atorvastatin  40 mg Oral Daily   carvedilol  6.25 mg Oral BID WC   chlorhexidine  15 mL Mouth Rinse BID   empagliflozin  10 mg Oral Daily   enoxaparin (LOVENOX) injection  80 mg Subcutaneous Q24H   fluticasone  2 spray Each Nare Daily   furosemide  60 mg Oral BID   influenza vac split quadrivalent PF  0.5 mL Intramuscular Tomorrow-1000   insulin aspart  0-15 Units Subcutaneous TID WC   insulin aspart  0-5 Units Subcutaneous QHS   insulin aspart  6 Units Subcutaneous TID WC   potassium chloride  20 mEq Oral Daily   sacubitril-valsartan  1 tablet Oral BID   sodium chloride flush  3 mL Intravenous Q12H   sodium chloride flush  3 mL Intravenous Q12H   sodium chloride flush  3 mL Intravenous Q12H   Continuous Infusions:  sodium chloride     sodium chloride      Principal Problem:   Acute on chronic diastolic heart failure (HCC) Active Problems:   Essential hypertension,  benign   Hypocalcemia   OSA (obstructive sleep apnea)   Hypoxia   Morbid obesity with BMI of 50.0-59.9, adult (HCC)   NSTEMI (non-ST elevated myocardial infarction) ([place])   Diabetes mellitus type 2 in obese ([place])   Prolonged QT interval   LOS: 8 days   A & P  Acute on chronic combined systolic and diastolic CHF: 2D echo 0/0/1749: LVEF 30-35%, LV with global hypokinesis and grade 2 diastolic dysfunction.  Moderate MR.  EF appears slightly worse than prior echo in 2016. Concern for ischemic cardiomyopathy with dilated cardiomyopathy IV Lasix was increased from 40 to 80 mg every 12 hours. -9.8 L since admission and weight down  approximately 20 pounds. Remains significantly volume overloaded. Continue aspirin, carvedilol, Entresto and Jardiance LHC completed. No intervention required. EF 30-35%. The patient's IV lasix has been transitioned to 60 mg orally twice daily. Pt is currently negative 16.47 liters from admission, but still with significant lower extremity edema.   Acute respiratory failure with hypoxia secondary to decompensated CHF: Resolved. Had respiratory distress and oxygen saturations of 89% on room air on arrival to ED. Pt continues to require 3L [place] to maintain saturations in the 90's. Continue diuresis and to monitor volume status. Wean O2 as possible.  Sinus congestion/cough: Flonase started. See no signs of infection. Monitor. Pt is getting robitussin with codeine. I see no need for discharge on this medication.   Elevated troponins Mildly elevated high-sensitivity troponins, peaked to 145.  No anginal symptoms. Suspect oxygen supply demand mismatch due to presentation with decompensated CHF and acute respiratory failure but ACS cannot be ruled out. Cardiology consulting and plan cardiac cath pending stabilization of volume status. LHC on 1/5 demonstrated no significant CAD with dilated CMO EF 30-35%.   Type II DM A1c 6.4.  Metformin on hold. Good control on SSI.   Essential hypertension Controlled. Continue carvedilol.  Entresto initiated.   OSA on CPAP Continue   Prolonged QTC EKG 12/31 showed QTC of 490 ms.  Most recent potassium is 4 and magnesium 2.  Repeat and follow EKG.  Minimize QT prolonging meds.   Mild dilatation of ascending aorta Noted on 2D echo 01/28/2021.  Measuring 41 mm with mild atheroma plaque involving the aortic root and ascending aorta. Outpatient follow-up.   Body mass index is 50.78 kg/m./Morbid obesity Needs lifestyle modification and weight loss.  I have seen and examined this patient myself. I have spent 34 minutes in his evaluation and care.   DVT  prophylaxis: Place TED hose Start: 01/28/21 1212 Place TED hose Start: 01/28/21 1009     Code Status: Full Code Family Communication: None at bedside Disposition:  Status is: Inpatient   Tashan Kreitzer, DO [facility] Direct contact: see [email or web address]  7PM-7AM contact night coverage as above 02/05/2021, 7:37:37 PM  LOS: 4 days

## 2021-02-05 NOTE — Progress Notes (Signed)
Mobility Specialist Progress Note    02/05/21 1359  Mobility  Activity Ambulated in hall  Level of Assistance Standby assist, set-up cues, supervision of patient - no hands on  Assistive Device Front wheel walker  Distance Ambulated (ft) 430 ft  Mobility Ambulated with assistance in hallway  Mobility Response Tolerated fair  Mobility performed by Mobility specialist  $Mobility charge 1 Mobility   Pt received sitting EOB and agreeable. No complaints on walk. Ambulated on RA. Returned to sitting EOB with call bell in reach.   [facility] Mobility Specialist  M.S. 2C and 6E: [phone] M.S. 4E: [phone]

## 2021-02-05 NOTE — Consult Note (Signed)
Ref: Jonathon Resides, MD   Subjective:  Awake. Decreased cough. Improved oxygenation. VS stable. Monitor shows sinus rhythm.  Objective:  Vital Signs in the last 24 hours: Temp:  [97.4 F (36.3 C)-97.8 F (36.6 C)] 97.8 F (36.6 C) (01/09 1946) Pulse Rate:  [58-78] 77 (01/09 1946) Cardiac Rhythm: Normal sinus rhythm (01/09 1928) Resp:  [18-19] 18 (01/09 1946) BP: (100-114)/(80-84) 100/84 (01/09 1946) SpO2:  [89 %-93 %] 93 % (01/09 1946) Weight:  [157.7 kg] 157.7 kg (01/09 0445)  Physical Exam: BP Readings from Last 1 Encounters:  02/05/21 100/84     Wt Readings from Last 1 Encounters:  02/05/21 (!) 157.7 kg    Weight change: -1.089 kg Body mass index is 49.89 kg/m. HEENT: [place]/AT, Eyes-Brown, PERL, EOMI, Conjunctiva-Pink, Sclera-Non-icteric Neck: No JVD, No bruit, Trachea midline. Lungs:  Clear, Bilateral. Cardiac:  Regular rhythm, normal S1 and S2, no S3. II/VI systolic murmur. Abdomen:  Soft, non-tender. BS present. Extremities:  No edema present. No cyanosis. No clubbing. CNS: AxOx3, Cranial nerves grossly intact, moves all 4 extremities.  Skin: Warm and dry.   Intake/Output from previous day: 01/08 0701 - 01/09 0700 In: 897 [P.O.:840; IV Piggyback:57] Out: 2950 [Urine:2950]    Lab Results: BMET    Component Value Date/Time   NA 135 02/05/2021 0237   NA 136 02/04/2021 0235   NA 135 02/02/2021 0135   K 4.2 02/05/2021 0237   K 4.0 02/04/2021 0235   K 4.1 02/02/2021 0135   CL 100 02/05/2021 0237   CL 100 02/04/2021 0235   CL 99 02/02/2021 0135   CO2 22 02/05/2021 0237   CO2 25 02/04/2021 0235   CO2 23 02/02/2021 0135   GLUCOSE 90 02/05/2021 0237   GLUCOSE 83 02/04/2021 0235   GLUCOSE 103 (H) 02/02/2021 0135   BUN 14 02/05/2021 0237   BUN 15 02/04/2021 0235   BUN 16 02/02/2021 0135   CREATININE 1.12 02/05/2021 0237   CREATININE 1.23 02/04/2021 0235   CREATININE 1.16 02/02/2021 0135   CREATININE 1.11 09/09/2014 1017   CALCIUM 8.8 (L) 02/05/2021 0237    CALCIUM 8.8 (L) 02/04/2021 0235   CALCIUM 8.9 02/02/2021 0135   GFRNONAA >60 02/05/2021 0237   GFRNONAA >60 02/04/2021 0235   GFRNONAA >60 02/02/2021 0135   GFRAA >60 07/30/2018 1115   GFRAA >60 12/23/2017 0915   GFRAA >60 08/04/2017 2006   CBC    Component Value Date/Time   WBC 11.5 (H) 02/04/2021 0235   RBC 6.75 (H) 02/04/2021 0235   HGB 18.0 (H) 02/04/2021 0235   HCT 57.1 (H) 02/04/2021 0235   PLT 363 02/04/2021 0235   MCV 84.6 02/04/2021 0235   MCH 26.7 02/04/2021 0235   MCHC 31.5 02/04/2021 0235   RDW 17.1 (H) 02/04/2021 0235   LYMPHSABS 5.2 (H) 02/04/2021 0235   MONOABS 0.9 02/04/2021 0235   EOSABS 0.3 02/04/2021 0235   BASOSABS 0.1 02/04/2021 0235   HEPATIC Function Panel Recent Labs    01/27/21 2002 02/01/21 0215  PROT 7.3 7.4   HEMOGLOBIN A1C No components found for: HGA1C,  MPG CARDIAC ENZYMES Lab Results  Component Value Date   TROPONINI <0.03 08/04/2017   TROPONINI 0.08 (H) 08/31/2014   TROPONINI 0.08 (H) 08/31/2014   BNP No results for input(s): PROBNP in the last 8760 hours. TSH Recent Labs    01/28/21 0246  TSH 1.003   CHOLESTEROL No results for input(s): CHOL in the last 8760 hours.  Scheduled Meds:  aspirin EC  81 mg Oral Daily   atorvastatin  40 mg Oral Daily   carvedilol  6.25 mg Oral BID WC   chlorhexidine  15 mL Mouth Rinse BID   empagliflozin  10 mg Oral Daily   enoxaparin (LOVENOX) injection  80 mg Subcutaneous Q24H   fluticasone  2 spray Each Nare Daily   furosemide  60 mg Oral BID   influenza vac split quadrivalent PF  0.5 mL Intramuscular Tomorrow-1000   insulin aspart  0-15 Units Subcutaneous TID WC   insulin aspart  0-5 Units Subcutaneous QHS   insulin aspart  6 Units Subcutaneous TID WC   potassium chloride  20 mEq Oral Daily   sacubitril-valsartan  1 tablet Oral BID   sodium chloride flush  3 mL Intravenous Q12H   sodium chloride flush  3 mL Intravenous Q12H   sodium chloride flush  3 mL Intravenous Q12H    Continuous Infusions:  sodium chloride     sodium chloride     PRN Meds:.sodium chloride, sodium chloride, acetaminophen, guaiFENesin-codeine, sodium chloride flush, sodium chloride flush  Assessment/Plan:  Acute on chronic systolic left heart failure Acute respiratory failure with hypoxia Non-sustained VT Dilated cardiomyopathy HTN HLD Type 2 DM Morbid obesity Obstructive sleep apnea  Plan: Increased activity. May discharge from cardiology stand point.   LOS: 8 days   Time spent including chart review, lab review, examination, discussion with patient : 30 min   Dixie Dials  MD  02/05/2021, 9:08 PM

## 2021-02-05 NOTE — Progress Notes (Signed)
Pt stated he could place himself on cpap when ready for bed. Rt will continue to monitor as needed.

## 2021-02-06 LAB — GLUCOSE, CAPILLARY
Glucose-Capillary: 129 mg/dL — ABNORMAL HIGH (ref 70–99)
Glucose-Capillary: 120 mg/dL — ABNORMAL HIGH (ref 70–99)
Glucose-Capillary: 93 mg/dL (ref 70–99)
Glucose-Capillary: 141 mg/dL — ABNORMAL HIGH (ref 70–99)

## 2021-02-06 NOTE — Progress Notes (Signed)
Mobility Specialist Progress Note    02/06/21 1154  Mobility  Activity Ambulated in hall  Level of Assistance Modified independent, requires aide device or extra time  Assistive Device Front wheel walker  Distance Ambulated (ft) 490 ft  Mobility Ambulated independently in hallway  Mobility Response Tolerated well  Mobility performed by Mobility specialist  $Mobility charge 1 Mobility   Pt received in bed and agreeable. No complaints on walk. Returned to sitting EOB with call bell in reach.   [facility] Mobility Specialist  M.S. 2C and 6E: [phone] M.S. 4E: [phone]

## 2021-02-06 NOTE — Care Management (Signed)
5188 02-06-21 Benefits check was previously submitted for Entresto. John Conner will need prior authorization. MD will need to call [phone] for prior authorization. No further needs from Case Manager at this time.

## 2021-02-06 NOTE — Progress Notes (Signed)
Patient placed himself on CPAP machine for the night.

## 2021-02-06 NOTE — Progress Notes (Signed)
Heart Failure Stewardship Pharmacist Progress Note   PCP: Jonathon Resides, MD PCP-Cardiologist: None    HPI:  61 yo M with PMH of HTN, HLD, T2DM, CHF, obesity, and OSA. He presented to the ED on 01/27/21 with shortness of breath and LE edema. Was out of medications for 3 days PTA. CXR significant for mild R basilar atelectasis. An ECHO was done on 01/30/21 and LVEF is 30-35% (35-40% in Aug 2016) with G2DD and mildly reduced RV systolic function. No significant CAD on cath on 02/01/21.  Current HF Medications: Diuretic: furosemide 60 mg PO BID Beta Blocker: carvedilol 6.25 mg BID ACE/ARB/ARNI: Entresto 24/26 mg BID SGLT2i: Jardiance 10 mg daily  Prior to admission HF Medications: Diuretic: furosemide 40 mg BID Beta blocker: carvedilol 12.5 mg BID  Pertinent Lab Values: Serum creatinine 1.12, BUN 14, Potassium 4.2, Sodium 135, BNP 765, Magnesium 2.8, A1c 6.3  Vital Signs: Weight: 345 lbs (admission weight: 373 lbs) Blood pressure: 110/90s  Heart rate: 70s  I/O: -1.3L yesterday; net -15.2L  Medication Assistance / Insurance Benefits Check: Does the patient have prescription insurance?  Yes Type of insurance plan: [place] Medicaid  Outpatient Pharmacy:  Prior to admission outpatient pharmacy: [facility] Is the patient willing to use [place] at discharge? Yes    Assessment: 1. Acute on chronic systolic CHF (EF 6-50%), due to NICM. NYHA class II symptoms. - Continue furosemide 60 mg PO BID - Continue carvedilol 6.25 mg BID - Continue Entresto 24/26 mg BID - Consider adding spironolactone prior to discharge - Continue Jardiance 10 mg daily   Plan: 1) Medication changes recommended at this time: - Add spironolactone 12.5 mg daily  2) Patient assistance: - Willette Alma CPhT completed prior auth for [name] - Jardiance copay $4 per month  3)  Education  - To be completed prior to discharge  Kerby Nora, PharmD, BCPS Heart Failure Forensic scientist Phone [phone]

## 2021-02-06 NOTE — Consult Note (Addendum)
Ref: Jonathon Resides, MD   Subjective:  Awake. Sitting up. Decreasing leg edema. Awaiting Entresto authorization from current insurance.  Objective:  Vital Signs in the last 24 hours: Temp:  [97.4 F (36.3 C)-97.8 F (36.6 C)] 97.7 F (36.5 C) (01/10 2026) Pulse Rate:  [42-85] 85 (01/10 2145) Cardiac Rhythm: Heart block (01/10 1936) Resp:  [17-19] 19 (01/10 2145) BP: (108-116)/(55-89) 116/79 (01/10 2026) SpO2:  [95 %-97 %] 96 % (01/10 2145) Weight:  [156.9 kg] 156.9 kg (01/10 0517)  Physical Exam: BP Readings from Last 1 Encounters:  02/06/21 116/79     Wt Readings from Last 1 Encounters:  02/06/21 (!) 156.9 kg    Weight change: -0.816 kg Body mass index is 49.63 kg/m. HEENT: [place]/AT, Eyes-Brown, Conjunctiva-Pink, Sclera-Non-icteric Neck: No JVD, No bruit, Trachea midline. Lungs:  Clear, Bilateral. Cardiac:  Regular rhythm, normal S1 and S2, no S3. II/VI systolic murmur. Abdomen:  Soft, non-tender. BS present. Extremities:  1 + edema present. No cyanosis. No clubbing. CNS: AxOx3, Cranial nerves grossly intact, moves all 4 extremities.  Skin: Warm and dry.   Intake/Output from previous day: 01/09 0701 - 01/10 0700 In: 2079 [P.O.:2076; I.V.:3] Out: 1300 [Urine:1300]    Lab Results: BMET    Component Value Date/Time   NA 135 02/05/2021 0237   NA 136 02/04/2021 0235   NA 135 02/02/2021 0135   K 4.2 02/05/2021 0237   K 4.0 02/04/2021 0235   K 4.1 02/02/2021 0135   CL 100 02/05/2021 0237   CL 100 02/04/2021 0235   CL 99 02/02/2021 0135   CO2 22 02/05/2021 0237   CO2 25 02/04/2021 0235   CO2 23 02/02/2021 0135   GLUCOSE 90 02/05/2021 0237   GLUCOSE 83 02/04/2021 0235   GLUCOSE 103 (H) 02/02/2021 0135   BUN 14 02/05/2021 0237   BUN 15 02/04/2021 0235   BUN 16 02/02/2021 0135   CREATININE 1.12 02/05/2021 0237   CREATININE 1.23 02/04/2021 0235   CREATININE 1.16 02/02/2021 0135   CREATININE 1.11 09/09/2014 1017   CALCIUM 8.8 (L) 02/05/2021 0237   CALCIUM 8.8  (L) 02/04/2021 0235   CALCIUM 8.9 02/02/2021 0135   GFRNONAA >60 02/05/2021 0237   GFRNONAA >60 02/04/2021 0235   GFRNONAA >60 02/02/2021 0135   GFRAA >60 07/30/2018 1115   GFRAA >60 12/23/2017 0915   GFRAA >60 08/04/2017 2006   CBC    Component Value Date/Time   WBC 11.5 (H) 02/04/2021 0235   RBC 6.75 (H) 02/04/2021 0235   HGB 18.0 (H) 02/04/2021 0235   HCT 57.1 (H) 02/04/2021 0235   PLT 363 02/04/2021 0235   MCV 84.6 02/04/2021 0235   MCH 26.7 02/04/2021 0235   MCHC 31.5 02/04/2021 0235   RDW 17.1 (H) 02/04/2021 0235   LYMPHSABS 5.2 (H) 02/04/2021 0235   MONOABS 0.9 02/04/2021 0235   EOSABS 0.3 02/04/2021 0235   BASOSABS 0.1 02/04/2021 0235   HEPATIC Function Panel Recent Labs    01/27/21 2002 02/01/21 0215  PROT 7.3 7.4   HEMOGLOBIN A1C No components found for: HGA1C,  MPG CARDIAC ENZYMES Lab Results  Component Value Date   TROPONINI <0.03 08/04/2017   TROPONINI 0.08 (H) 08/31/2014   TROPONINI 0.08 (H) 08/31/2014   BNP No results for input(s): PROBNP in the last 8760 hours. TSH Recent Labs    01/28/21 0246  TSH 1.003   CHOLESTEROL No results for input(s): CHOL in the last 8760 hours.  Scheduled Meds:  aspirin EC  81 mg  Oral Daily   atorvastatin  40 mg Oral Daily   carvedilol  6.25 mg Oral BID WC   chlorhexidine  15 mL Mouth Rinse BID   empagliflozin  10 mg Oral Daily   enoxaparin (LOVENOX) injection  80 mg Subcutaneous Q24H   fluticasone  2 spray Each Nare Daily   furosemide  60 mg Oral BID   insulin aspart  0-15 Units Subcutaneous TID WC   insulin aspart  0-5 Units Subcutaneous QHS   insulin aspart  6 Units Subcutaneous TID WC   potassium chloride  20 mEq Oral Daily   sacubitril-valsartan  1 tablet Oral BID   sodium chloride flush  3 mL Intravenous Q12H   sodium chloride flush  3 mL Intravenous Q12H   sodium chloride flush  3 mL Intravenous Q12H   Continuous Infusions:  sodium chloride     sodium chloride     PRN Meds:.sodium chloride,  sodium chloride, acetaminophen, guaiFENesin-codeine, sodium chloride flush, sodium chloride flush  Assessment/Plan:  Acute on chronic systolic left heart failure Acute respiratory failure with hypoxia Non-sustained VT Dilated cardiomyopathy HTN HLD Type 2 DM Morbid obesity Obstructive sleep apnea  Plan: May use valsartan 80-160 mg. Daily till [name] is approved. F/U in 1 week.   LOS: 9 days   Time spent including chart review, lab review, examination, discussion with patient/Nurse : 30 min   Dixie Dials  MD  02/06/2021, 9:54 PM

## 2021-02-06 NOTE — Progress Notes (Signed)
PROGRESS NOTE  John Conner YPP:[id number] DOB: 1960/05/24 DOA: 01/27/2021 PCP: Jonathon Resides, MD  Brief History   61 year old male with medical history significant for HTN, HLD, type II DM, chronic systolic CHF, morbid obesity, OSA on CPAP, stopped taking his Lasix and HCTZ for approximately a week prior to admission due to insurance issues and presented with progressive dyspnea and leg edema.  He also developed cough on Christmas Day and tested positive for COVID-19 on 12/26, treated with Paxlovid.  In the ED hypoxic to 89% on room air.  Admitted for acute respiratory failure with hypoxia due to acute on chronic combined systolic and diastolic CHF, elevated troponins felt to be due to demand ischemia but cannot rule out ACS.  Cardiology consulted, pt underwent LHC which showed LVEF of 30-35%. No significant CAD noted. Medical management is recommended for risk reduction.    Consultants  Cardiology  Procedures  LHC  Antibiotics   Anti-infectives (From admission, onward)    None      Subjective  Patient denies any new complaints, able to ambulate in the hallway with a walker, with no issues.  Objective   Vitals:  Vitals:   02/06/21 1307 02/06/21 1400  BP:  (!) 108/55  Pulse:  (!) 42  Resp:  18  Temp: 97.7 F (36.5 C) 97.8 F (36.6 C)  SpO2:  95%   Exam:  General: NAD  Cardiovascular: S1, S2 present Respiratory: Diminished breath sounds bilaterally Abdomen: Soft, nontender, nondistended, bowel sounds present Musculoskeletal: 1+ bilateral pedal edema noted Skin: Normal Psychiatry: Normal mood  I have personally reviewed the following:     Scheduled Meds:  aspirin EC  81 mg Oral Daily   atorvastatin  40 mg Oral Daily   carvedilol  6.25 mg Oral BID WC   chlorhexidine  15 mL Mouth Rinse BID   empagliflozin  10 mg Oral Daily   enoxaparin (LOVENOX) injection  80 mg Subcutaneous Q24H   fluticasone  2 spray Each Nare Daily   furosemide  60 mg Oral BID   insulin  aspart  0-15 Units Subcutaneous TID WC   insulin aspart  0-5 Units Subcutaneous QHS   insulin aspart  6 Units Subcutaneous TID WC   potassium chloride  20 mEq Oral Daily   sacubitril-valsartan  1 tablet Oral BID   sodium chloride flush  3 mL Intravenous Q12H   sodium chloride flush  3 mL Intravenous Q12H   sodium chloride flush  3 mL Intravenous Q12H   Continuous Infusions:  sodium chloride     sodium chloride      Principal Problem:   Acute on chronic diastolic heart failure (HCC) Active Problems:   Essential hypertension, benign   Hypocalcemia   OSA (obstructive sleep apnea)   Hypoxia   Morbid obesity with BMI of 50.0-59.9, adult (HCC)   NSTEMI (non-ST elevated myocardial infarction) ([place])   Diabetes mellitus type 2 in obese (HCC)   Prolonged QT interval   LOS: 9 days   A & P  Acute on chronic combined systolic and diastolic CHF: Improving, has been diuresing adequately 2D echo 01/28/2021: LVEF 30-35%, LV with global hypokinesis and grade 2 diastolic dysfunction.  Moderate MR Cardiology consulted, pt underwent LHC which showed LVEF of 30-35%. No significant CAD noted. Medical management is recommended for risk reduction Transitioned to PO 60 mg Lasix twice daily Continue aspirin, carvedilol, Entresto and Jardiance (patient awaiting authorization for [employer].  Patient will also need TOC pharmacy for medication refill upon discharge)  Acute respiratory failure with hypoxia secondary to decompensated CHF Resolved Required 3 L [place], currently weaned off O2 and saturating above 90% Monitor closely  Sinus congestion/cough Flonase started. See no signs of infection. Monitor   Elevated troponins Mildly elevated high-sensitivity troponins, peaked to 145.  No anginal symptoms. Suspect oxygen supply demand mismatch due to presentation with decompensated CHF and acute respiratory failure  Cardiology consulted as mentioned above   Type II DM, controlled A1c 6.4.  Metformin on  hold Continue SSI, Accu-Cheks, hypoglycemic protocol   Essential hypertension Continue carvedilol.  Entresto initiated.   OSA on CPAP Continue CPAP   Prolonged QTC Minimize QT prolonging meds.   Mild dilatation of ascending aorta Noted on 2D echo 01/28/2021.  Measuring 41 mm with mild atheroma plaque involving the aortic root and ascending aorta. Outpatient follow-up.   Body mass index is 49.63 kg/m./Morbid obesity Needs lifestyle modification and weight loss.     DVT prophylaxis: Lovenox    Code Status: Full Code Family Communication: None at bedside Disposition:  Status is: Inpatient   Alma Friendly, MD [facility] Direct contact: see [email or web address]  7PM-7AM contact night coverage as above

## 2021-02-07 ENCOUNTER — Telehealth (HOSPITAL_COMMUNITY): Payer: Self-pay | Admitting: *Deleted

## 2021-02-07 ENCOUNTER — Other Ambulatory Visit (HOSPITAL_COMMUNITY): Payer: Self-pay

## 2021-02-07 LAB — BASIC METABOLIC PANEL
Anion gap: 10 (ref 5–15)
BUN: 16 mg/dL (ref 6–20)
CO2: 25 mmol/L (ref 22–32)
Calcium: 8.7 mg/dL — ABNORMAL LOW (ref 8.9–10.3)
Chloride: 102 mmol/L (ref 98–111)
Creatinine, Ser: 1.25 mg/dL — ABNORMAL HIGH (ref 0.61–1.24)
GFR, Estimated: >60 mL/min (ref >60)
Glucose, Bld: 89 mg/dL (ref 70–99)
Potassium: 3.8 mmol/L (ref 3.5–5.1)
Sodium: 137 mmol/L (ref 135–145)

## 2021-02-07 LAB — MAGNESIUM: Magnesium: 2.5 mg/dL — ABNORMAL HIGH (ref 1.7–2.4)

## 2021-02-07 LAB — GLUCOSE, CAPILLARY: Glucose-Capillary: 117 mg/dL — ABNORMAL HIGH (ref 70–99)

## 2021-02-07 MED ORDER — CARVEDILOL 12.5 MG PO TABS: 6.2500 mg | ORAL_TABLET | Freq: Two times a day (BID) | ORAL | Status: DC

## 2021-02-07 MED ORDER — SPIRONOLACTONE 12.5 MG HALF TABLET
12.5000 mg | ORAL_TABLET | Freq: Every day | ORAL | Status: DC
  Administered 2021-02-07: 12.5 mg via ORAL
  Filled 2021-02-07: qty 1

## 2021-02-07 MED ORDER — EMPAGLIFLOZIN 10 MG PO TABS
10.0000 mg | ORAL_TABLET | Freq: Every day | ORAL | 0 refills | Status: DC
  Filled 2021-02-07: qty 30, 30d supply, fill #0

## 2021-02-07 MED ORDER — SACUBITRIL-VALSARTAN 24-26 MG PO TABS
1.0000 | ORAL_TABLET | Freq: Two times a day (BID) | ORAL | 0 refills | Status: DC
Start: 2021-02-07 — End: 2021-04-12
  Filled 2021-02-07: qty 60, 30d supply, fill #0

## 2021-02-07 MED ORDER — POTASSIUM CHLORIDE CRYS ER 20 MEQ PO TBCR
20.0000 meq | EXTENDED_RELEASE_TABLET | Freq: Every day | ORAL | 0 refills | Status: AC
  Filled 2021-02-07: qty 30, 30d supply, fill #0

## 2021-02-07 MED ORDER — SPIRONOLACTONE 25 MG PO TABS
12.5000 mg | ORAL_TABLET | Freq: Every day | ORAL | 0 refills | Status: DC
  Filled 2021-02-07: qty 30, 60d supply, fill #0

## 2021-02-07 MED ORDER — FUROSEMIDE 40 MG PO TABS
40.0000 mg | ORAL_TABLET | Freq: Two times a day (BID) | ORAL | 0 refills | Status: DC
  Filled 2021-02-07: qty 60, 30d supply, fill #0

## 2021-02-07 NOTE — Discharge Summary (Signed)
Physician Discharge Summary  John Conner VZD:[id number] DOB: 1960/04/30 DOA: 01/27/2021  PCP: Jonathon Resides, MD  Admit date: 01/27/2021 Discharge date: 02/07/2021  Time spent: 35 minutes  Recommendations for Outpatient Follow-up:  Heart failure TOC clinic Dr. Doylene Canard in 1 to 2 weeks, needs BMP at follow-up Consider adding low-dose Aldactone if blood pressure tolerates   Discharge Diagnoses:  Principal Problem:   Acute on chronic systolic heart failure (HCC) Dilated cardiomyopathy   Essential hypertension, benign   Hypocalcemia   OSA (obstructive sleep apnea)   Hypoxia   Morbid obesity with BMI of 50.0-59.9, adult (HCC)   NSTEMI (non-ST elevated myocardial infarction) ([place])   Diabetes mellitus type 2 in obese ([place])   Prolonged QT interval   Discharge Condition: Stable  Diet recommendation: Low-sodium, heart healthy  Filed Weights   02/05/21 0445 02/06/21 0517 02/07/21 0354  Weight: (!) 157.7 kg (!) 156.9 kg (!) 157.2 kg    History of present illness:  61 year old male with medical history significant for HTN, HLD, type II DM, chronic systolic CHF, morbid obesity, OSA on CPAP, stopped taking his Lasix and HCTZ for approximately a week prior to admission due to insurance issues and presented with progressive dyspnea and leg edema.    Hospital Course:  Acute on chronic combined systolic and diastolic CHF: Improving, has been diuresing adequately 2D echo 01/28/2021: LVEF 30-35%, LV with global hypokinesis and grade 2 diastolic dysfunction.  Moderate MR Cardiology consulted, pt underwent LHC which showed LVEF of 30-35%. No significant CAD noted. Medical management is recommended Transitioned to PO Lasix, discharged on 40 Mg twice daily Continue aspirin, carvedilol, Entresto and Jardiance  -Discharged home in a stable condition, close follow-up with Dr. Doylene Canard -Consider adding low-dose Aldactone at follow-up   Acute respiratory failure with hypoxia secondary to  decompensated CHF Resolved, weaned off O2    Elevated troponins Mildly elevated high-sensitivity troponins, peaked to 145.  No anginal symptoms. -With nonischemic cardiomyopathy   Type II DM, controlled A1c 6.4.  Metformin resumed   Essential hypertension Continue carvedilol.  Entresto initiated.   OSA on CPAP Continue CPAP   Prolonged QTC Minimize QT prolonging meds.   Mild dilatation of ascending aorta Noted on 2D echo 01/28/2021.  Measuring 41 mm with mild atheroma plaque involving the aortic root and ascending aorta. Outpatient follow-up.   Body mass index is 49.63 kg/m./Morbid obesity Needs lifestyle modification and weight loss.  Discharge Exam: Vitals:   02/06/21 2145 02/07/21 0354  BP:  104/74  Pulse: 85   Resp: 19   Temp:  (!) 97.4 F (36.3 C)  SpO2: 96% 97%    General: AAOx3 Cardiovascular: S1S2/RRR Respiratory: CTAB  Discharge Instructions   Discharge Instructions     Diet - low sodium heart healthy   Complete by: As directed    Increase activity slowly   Complete by: As directed       Allergies as of 02/07/2021       Reactions   Diphenhydramine Hcl Swelling   Lips swell Lips swell   Lisinopril Other (See Comments)   cough        Medication List     STOP taking these medications    amLODipine 5 MG tablet Commonly known as: NORVASC   hydrochlorothiazide 25 MG tablet Commonly known as: HYDRODIURIL   labetalol 100 MG tablet Commonly known as: NORMODYNE   sucralfate 1 g tablet Commonly known as: Carafate       TAKE these medications    aspirin 81  MG chewable tablet Chew 81 mg by mouth daily.   atorvastatin 80 MG tablet Commonly known as: LIPITOR Take 80 mg by mouth daily.   carvedilol 12.5 MG tablet Commonly known as: COREG Take 0.5 tablets (6.25 mg total) by mouth 2 (two) times daily with a meal. What changed:  how much to take when to take this   Cholecalciferol 50 MCG (2000 UT) Caps Take 1 capsule by mouth  daily.   fluticasone 50 MCG/ACT nasal spray Commonly known as: FLONASE 1 spray by Each Nare route daily.   furosemide 40 MG tablet Commonly known as: LASIX Take 1 tablet (40 mg total) by mouth 2 (two) times daily.   Jardiance 10 MG Tabs tablet Generic drug: empagliflozin Take 1 tablet (10 mg total) by mouth daily. Start taking on: February 08, 2021   metFORMIN 500 MG tablet Commonly known as: GLUCOPHAGE Take by mouth 2 (two) times daily with a meal.   omeprazole 40 MG capsule Commonly known as: PRILOSEC Take 40 mg by mouth daily.   potassium chloride SA 20 MEQ tablet Commonly known as: KLOR-CON M Take 1 tablet (20 mEq total) by mouth daily. Start taking on: February 08, 2021   sacubitril-valsartan 24-26 MG Commonly known as: ENTRESTO Take 1 tablet by mouth 2 (two) times daily.   spironolactone 25 MG tablet Commonly known as: ALDACTONE Take 1/2 tablet (12.5 mg total) by mouth daily.               Durable Medical Equipment  (From admission, onward)           Start     Ordered   02/02/21 1426  For home use only DME 4 wheeled rolling walker with seat  Once       Comments: Bariatric Rollator  Question:  Patient needs a walker to treat with the following condition  Answer:  General weakness   02/02/21 1426           Allergies  Allergen Reactions   Diphenhydramine Hcl Swelling    Lips swell Lips swell    Lisinopril Other (See Comments)    cough    Follow-up Information     [facility] Follow up.   Why: Rolling walker with seat to be delivered to the patient's home. Contact information: [place] [address] [phone]         Dixie Dials, MD Follow up in 1 week(s).   Specialty: Cardiology Contact information: [place] [address] [phone]         [facility]. Go to.   Specialty: Cardiology Why: Monday, Jan. 16 @ noon for [facility]  clinic within [place]. Bring all medications with you. FREE valet parking at [employer], off [address]. Contact information: [address] [id number] [place] [phone]                 The results of significant diagnostics from this hospitalization (including imaging, microbiology, ancillary and laboratory) are listed below for reference.    Significant Diagnostic Studies: CARDIAC CATHETERIZATION  Result Date: 02/01/2021 Life style modification with heart healthy diet, exercise and medications. Repeat echocardiogram in 3 months post GDMT.   DG Chest Portable 1 View  Result Date: 01/27/2021 CLINICAL DATA:  COVID positive with worsening cough. EXAM: PORTABLE CHEST 1 VIEW COMPARISON:  October 31, 2020 FINDINGS: Mild atelectasis is suspected within the right lung  base. The lower lung fields are limited in evaluation secondary to overlying breast soft tissue artifact. Prominence of the perihilar pulmonary vasculature is seen. There is no evidence of a pleural effusion or pneumothorax. The cardiac silhouette is markedly enlarged and unchanged in size. The visualized skeletal structures are unremarkable. IMPRESSION: 1. Mild right basilar atelectasis. 2. Stable cardiomegaly with pulmonary vascular congestion. Electronically Signed   By: Virgina Norfolk M.D.   On: 01/27/2021 20:52   ECHOCARDIOGRAM COMPLETE  Result Date: 01/30/2021    ECHOCARDIOGRAM REPORT   Patient Name:   John Conner Date of Exam: 01/28/2021 Medical Rec #:  [id number]     Height:       70.0 in Accession #:    [id number]    Weight:       373.0 lb Date of Birth:  08-Oct-1960      BSA:          2.720 m Patient Age:    50 years      BP:           106/80 mmHg Patient Gender: M             HR:           79 bpm. Exam Location:  Inpatient Procedure: 2D Echo, Cardiac Doppler, Color Doppler and Intracardiac            Opacification Agent Indications:     Congestive Heart Failure  I50.9  History:         Patient has prior history of Echocardiogram examinations, most                  recent 08/31/2014. Signs/Symptoms:Shortness of Breath; Risk                  Factors:Morbid obesity.  Sonographer:     Merrie Roof RDCS Referring Phys:  [id number] Eben Burow Diagnosing Phys: Dixie Dials MD IMPRESSIONS  1. Left ventricular ejection fraction, by estimation, is 30 to 35%. The left ventricle has moderately decreased function. The left ventricle demonstrates global hypokinesis. The left ventricular internal cavity size was moderately dilated. There is mild  left ventricular hypertrophy. Left ventricular diastolic parameters are consistent with Grade II diastolic dysfunction (pseudonormalization).  2. Right ventricular systolic function is mildly reduced. The right ventricular size is normal.  3. Left atrial size was severely dilated.  4. Right atrial size was severely dilated.  5. The mitral valve is degenerative. Moderate mitral valve regurgitation.  6. The aortic valve is tricuspid. There is mild calcification of the aortic valve. There is mild thickening of the aortic valve. Aortic valve regurgitation is not visualized. Aortic valve sclerosis is present, with no evidence of aortic valve stenosis.  7. There is mild dilatation of the ascending aorta, measuring 41 mm. There is mild (Grade II) atheroma plaque involving the aortic root and ascending aorta.  8. The inferior vena cava is dilated in size with <50% respiratory variability, suggesting right atrial pressure of 15 mmHg. Conclusion(s)/Recommendation(s): Findings consistent with ischemic cardiomyopathy and dilated cardiomyopathy. FINDINGS  Left Ventricle: Left ventricular ejection fraction, by estimation, is 30 to 35%. The left ventricle has moderately decreased function. The left ventricle demonstrates global hypokinesis. Severe hypokinesis of the left ventricular, entire septal wall. Moderate hypokinesis of the left ventricular, entire  anterolateral wall. Definity contrast agent was given IV to delineate the left ventricular endocardial borders. The left ventricular internal cavity size was moderately dilated. There is mild left ventricular hypertrophy. Left ventricular  diastolic parameters are consistent with Grade II diastolic dysfunction (pseudonormalization). Right Ventricle: The right ventricular size is normal. No increase in right ventricular wall thickness. Right ventricular systolic function is mildly reduced. Left Atrium: Left atrial size was severely dilated. Right Atrium: Right atrial size was severely dilated. Pericardium: There is no evidence of pericardial effusion. Mitral Valve: The mitral valve is degenerative in appearance. Moderate mitral valve regurgitation. Tricuspid Valve: The tricuspid valve is normal in structure. Tricuspid valve regurgitation is mild. Aortic Valve: The aortic valve is tricuspid. There is mild calcification of the aortic valve. There is mild thickening of the aortic valve. There is mild aortic valve annular calcification. Aortic valve regurgitation is not visualized. Aortic valve sclerosis is present, with no evidence of aortic valve stenosis. Pulmonic Valve: The pulmonic valve was normal in structure. Pulmonic valve regurgitation is trivial. Aorta: The aortic root is normal in size and structure. There is mild dilatation of the ascending aorta, measuring 41 mm. There is mild (Grade II) atheroma plaque involving the aortic root and ascending aorta. Venous: The inferior vena cava is dilated in size with less than 50% respiratory variability, suggesting right atrial pressure of 15 mmHg. IAS/Shunts: The atrial septum is grossly normal.  LEFT VENTRICLE PLAX 2D LVIDd:         6.00 cm      Diastology LVIDs:         4.30 cm      LV e' medial:    5.85 cm/s LV PW:         1.40 cm      LV E/e' medial:  25.6 LV IVS:        1.50 cm      LV e' lateral:   9.15 cm/s                             LV E/e' lateral: 16.4  LV  Volumes (MOD) LV vol d, MOD A2C: 486.0 ml LV vol d, MOD A4C: 454.0 ml LV vol s, MOD A2C: 334.0 ml LV vol s, MOD A4C: 317.0 ml LV SV MOD A2C:     152.0 ml LV SV MOD A4C:     454.0 ml LV SV MOD BP:      155.4 ml RIGHT VENTRICLE          IVC RV Basal diam:  5.45 cm  IVC diam: 3.00 cm RV Mid diam:    4.20 cm LEFT ATRIUM              Index        RIGHT ATRIUM           Index LA diam:        5.70 cm  2.10 cm/m   RA Area:     31.20 cm LA Vol (A2C):   205.0 ml 75.36 ml/m  RA Volume:   104.00 ml 38.23 ml/m LA Vol (A4C):   163.0 ml 59.92 ml/m LA Biplane Vol: 190.0 ml 69.84 ml/m  AORTIC VALVE LVOT Vmax:   95.00 cm/s LVOT Vmean:  61.300 cm/s LVOT VTI:    0.167 m  AORTA Ao Root diam: 3.70 cm Ao Asc diam:  4.10 cm MITRAL VALVE MV Area (PHT): 4.31 cm     SHUNTS MV Decel Time: 176 msec     Systemic VTI: 0.17 m MV E velocity: 150.00 cm/s MV A velocity: 39.30 cm/s MV E/A ratio:  3.82 Dixie Dials MD Electronically signed  by Dixie Dials MD Signature Date/Time: 01/30/2021/11:25:23 AM    Final     Microbiology: Recent Results (from the past 240 hour(s))  Resp Panel by RT-PCR (Flu A&B, Covid) Nasopharyngeal Swab     Status: None   Collection Time: 01/30/21 12:40 PM   Specimen: Nasopharyngeal Swab; Nasopharyngeal(NP) swabs in vial transport medium  Result Value Ref Range Status   SARS Coronavirus 2 by RT PCR NEGATIVE NEGATIVE Final    Comment: (NOTE) SARS-CoV-2 target nucleic acids are NOT DETECTED.  The SARS-CoV-2 RNA is generally detectable in upper respiratory specimens during the acute phase of infection. The lowest concentration of SARS-CoV-2 viral copies this assay can detect is 138 copies/mL. A negative result does not preclude SARS-Cov-2 infection and should not be used as the sole basis for treatment or other patient management decisions. A negative result may occur with  improper specimen collection/handling, submission of specimen other than nasopharyngeal swab, presence of viral mutation(s) within  the areas targeted by this assay, and inadequate number of viral copies(<138 copies/mL). A negative result must be combined with clinical observations, patient history, and epidemiological information. The expected result is Negative.  Fact Sheet for Patients:  EntrepreneurPulse.com.au  Fact Sheet for Healthcare Providers:  IncredibleEmployment.be  This test is no t yet approved or cleared by the [place] FDA and  has been authorized for detection and/or diagnosis of SARS-CoV-2 by FDA under an Emergency Use Authorization (EUA). This EUA will remain  in effect (meaning this test can be used) for the duration of the COVID-19 declaration under Section 564(b)(1) of the Act, 21 U.S.C.section 360bbb-3(b)(1), unless the authorization is terminated  or revoked sooner.       Influenza A by PCR NEGATIVE NEGATIVE Final   Influenza B by PCR NEGATIVE NEGATIVE Final    Comment: (NOTE) The Xpert Xpress SARS-CoV-2/FLU/RSV plus assay is intended as an aid in the diagnosis of influenza from Nasopharyngeal swab specimens and should not be used as a sole basis for treatment. Nasal washings and aspirates are unacceptable for Xpert Xpress SARS-CoV-2/FLU/RSV testing.  Fact Sheet for Patients: EntrepreneurPulse.com.au  Fact Sheet for Healthcare Providers: IncredibleEmployment.be  This test is not yet approved or cleared by the [place] FDA and has been authorized for detection and/or diagnosis of SARS-CoV-2 by FDA under an Emergency Use Authorization (EUA). This EUA will remain in effect (meaning this test can be used) for the duration of the COVID-19 declaration under Section 564(b)(1) of the Act, 21 U.S.C. section 360bbb-3(b)(1), unless the authorization is terminated or revoked.  Performed at [facility], [place] [address] [place],  [address]      Labs: Basic Metabolic Panel: Recent Labs  Lab  02/01/21 0215 02/02/21 0135 02/04/21 0235 02/05/21 0237 02/07/21 0213  NA 134* 135 136 135 137  K 3.5 4.1 4.0 4.2 3.8  CL 97* 99 100 100 102  CO2 27 23 25 22 25   GLUCOSE 94 103* 83 90 89  BUN 17 16 15 14 16   CREATININE 1.30* 1.16 1.23 1.12 1.25*  CALCIUM 9.2 8.9 8.8* 8.8* 8.7*  MG  --   --   --  2.8* 2.5*   Liver Function Tests: Recent Labs  Lab 02/01/21 0215  AST 52*  ALT 65*  ALKPHOS 79  BILITOT 1.3*  PROT 7.4  ALBUMIN 3.9   No results for input(s): LIPASE, AMYLASE in the last 168 hours. No results for input(s): AMMONIA in the last 168 hours. CBC: Recent Labs  Lab 02/01/21 0215  02/02/21 0135 02/03/21 0224 02/04/21 0235  WBC 12.0* 12.5* 12.6* 11.5*  NEUTROABS  --   --  5.2 5.1  HGB 18.0* 18.2* 18.0* 18.0*  HCT 58.8* 57.8* 57.1* 57.1*  MCV 85.6 84.4 84.8 84.6  PLT 385 397 374 363   Cardiac Enzymes: No results for input(s): CKTOTAL, CKMB, CKMBINDEX, TROPONINI in the last 168 hours. BNP: BNP (last 3 results) Recent Labs    01/27/21 2002  BNP 765.8*    ProBNP (last 3 results) No results for input(s): PROBNP in the last 8760 hours.  CBG: Recent Labs  Lab 02/06/21 0751 02/06/21 1205 02/06/21 1604 02/06/21 2105 02/07/21 0723  GLUCAP 129* 120* 93 141* 117*       Signed:  Domenic Polite MD.  [facility] 02/07/2021, 11:28 AM

## 2021-02-07 NOTE — Telephone Encounter (Signed)
Pt d/c'd from hospital before I could get him entresto samples, called pt and returned to [facility]&V center to pick them up, provided samples and directions on how to take medication and reminded him of appt next week. Pt thankful for asssistance  Medication Samples have been provided to the patient.  Drug name: [name]       Strength: 24/26        Qty: 1  LOT: [id number]  Exp.Date: 2/25  Dosing instructions: take 1 tab Twice daily   The patient has been instructed regarding the correct time, dose, and frequency of taking this medication, including desired effects and most common side effects.   Bertha Earwood 11:42 AM 02/07/2021

## 2021-02-07 NOTE — Progress Notes (Signed)
Heart Failure Stewardship Pharmacist Progress Note   PCP: Jonathon Resides, MD PCP-Cardiologist: None    HPI:  61 yo M with PMH of HTN, HLD, T2DM, CHF, obesity, and OSA. He presented to the ED on 01/27/21 with shortness of breath and LE edema. Was out of medications for 3 days PTA. CXR significant for mild R basilar atelectasis. An ECHO was done on 01/30/21 and LVEF is 30-35% (35-40% in Aug 2016) with G2DD and mildly reduced RV systolic function. No significant CAD on cath on 02/01/21.  Current HF Medications: Diuretic: furosemide 60 mg PO BID Beta Blocker: carvedilol 6.25 mg BID ACE/ARB/ARNI: Entresto 24/26 mg BID SGLT2i: Jardiance 10 mg daily  Prior to admission HF Medications: Diuretic: furosemide 40 mg BID Beta blocker: carvedilol 12.5 mg BID  Pertinent Lab Values: Serum creatinine 1.25, BUN 16, Potassium 3.8, Sodium 137, BNP 765, Magnesium 2.5, A1c 6.3  Vital Signs: Weight: 346 lbs (admission weight: 373 lbs) Blood pressure: 110/70s  Heart rate: 70s  I/O: -2.2L yesterday; net -16.7L  Medication Assistance / Insurance Benefits Check: Does the patient have prescription insurance?  Yes Type of insurance plan: [place] Medicaid  Outpatient Pharmacy:  Prior to admission outpatient pharmacy: [facility] Is the patient willing to use [facility] at discharge? Yes    Assessment: 1. Acute on chronic systolic CHF (EF 3-73%), due to NICM. NYHA class II symptoms. - Continue furosemide 60 mg PO BID - Continue carvedilol 6.25 mg BID - Continue Entresto 24/26 mg BID - Consider adding spironolactone prior to discharge - Continue Jardiance 10 mg daily   Plan: 1) Medication changes recommended at this time: - Add spironolactone 12.5 mg daily  2) Patient assistance: - Willette Alma CPhT completed prior auth for [name] - Jardiance copay $4 per month  3)  Education  - To be completed prior to discharge  Kerby Nora, PharmD, BCPS Heart Failure Forensic scientist Phone [phone]

## 2021-02-08 ENCOUNTER — Other Ambulatory Visit (HOSPITAL_COMMUNITY): Payer: Self-pay

## 2021-02-08 ENCOUNTER — Telehealth (HOSPITAL_COMMUNITY): Payer: Self-pay | Admitting: *Deleted

## 2021-02-08 MED ORDER — ATORVASTATIN CALCIUM 80 MG PO TABS: 80.0000 mg | ORAL_TABLET | Freq: Every day | ORAL | 0 refills | Status: DC

## 2021-02-08 MED ORDER — CARVEDILOL 6.25 MG PO TABS: 6.2500 mg | ORAL_TABLET | Freq: Two times a day (BID) | ORAL | 0 refills | Status: DC

## 2021-02-08 NOTE — Telephone Encounter (Signed)
Pt d/c'd from [facility] yesterday and is sch to be seen in [facility] clinic 02/12/21. He called to request rx for Carvedilol and Atorvastatin, he states these are 2 that he did not get when he left the hospital. Pt was on these meds prior to admission but he states he lost his pcp and then ran out of meds which is what led to his fluid retention and hospital admission. He states he is working to find a new pcp. Advised we are only seeing him for [facility] purposes but as he doesn't have these medications I will send in a 1 time rx for both, he is thankful for assistance and will be at his appt on 1/16

## 2021-02-09 ENCOUNTER — Encounter (HOSPITAL_COMMUNITY): Payer: Medicaid Other

## 2021-02-12 ENCOUNTER — Other Ambulatory Visit: Payer: Self-pay

## 2021-02-12 ENCOUNTER — Telehealth (HOSPITAL_COMMUNITY): Payer: Self-pay

## 2021-02-12 ENCOUNTER — Encounter (HOSPITAL_COMMUNITY): Payer: Self-pay

## 2021-02-12 ENCOUNTER — Ambulatory Visit (HOSPITAL_COMMUNITY)
Admission: RE | Admit: 2021-02-12 | Discharge: 2021-02-12 | Disposition: A | Discharge status: Home / Self Care | Payer: No Typology Code available for payment source | Source: Ambulatory Visit | Attending: Cardiology | Admitting: Cardiology

## 2021-02-12 VITALS — BP 110/68 | HR 65 | Wt 348.1 lb

## 2021-02-12 DIAGNOSIS — Z87891 Personal history of nicotine dependence: Secondary | ICD-10-CM | POA: Diagnosis not present

## 2021-02-12 DIAGNOSIS — I110 Hypertensive heart disease with heart failure: Secondary | ICD-10-CM | POA: Diagnosis not present

## 2021-02-12 DIAGNOSIS — I5022 Chronic systolic (congestive) heart failure: Secondary | ICD-10-CM | POA: Diagnosis not present

## 2021-02-12 DIAGNOSIS — E7800 Pure hypercholesterolemia, unspecified: Secondary | ICD-10-CM | POA: Diagnosis not present

## 2021-02-12 DIAGNOSIS — E119 Type 2 diabetes mellitus without complications: Secondary | ICD-10-CM | POA: Insufficient documentation

## 2021-02-12 DIAGNOSIS — Z79899 Other long term (current) drug therapy: Secondary | ICD-10-CM | POA: Insufficient documentation

## 2021-02-12 DIAGNOSIS — G4733 Obstructive sleep apnea (adult) (pediatric): Secondary | ICD-10-CM | POA: Diagnosis not present

## 2021-02-12 DIAGNOSIS — I428 Other cardiomyopathies: Secondary | ICD-10-CM | POA: Insufficient documentation

## 2021-02-12 LAB — COMPREHENSIVE METABOLIC PANEL
ALT: 45 U/L — ABNORMAL HIGH (ref 0–44)
AST: 37 U/L (ref 15–41)
Albumin: 3.7 g/dL (ref 3.5–5.0)
Alkaline Phosphatase: 68 U/L (ref 38–126)
Anion gap: 10 (ref 5–15)
BUN: 11 mg/dL (ref 6–20)
CO2: 25 mmol/L (ref 22–32)
Calcium: 9.1 mg/dL (ref 8.9–10.3)
Chloride: 103 mmol/L (ref 98–111)
Creatinine, Ser: 1.26 mg/dL — ABNORMAL HIGH (ref 0.61–1.24)
GFR, Estimated: >60 mL/min (ref >60)
Glucose, Bld: 95 mg/dL (ref 70–99)
Potassium: 4.4 mmol/L (ref 3.5–5.1)
Sodium: 138 mmol/L (ref 135–145)
Total Bilirubin: 1.3 mg/dL — ABNORMAL HIGH (ref 0.3–1.2)
Total Protein: 6.9 g/dL (ref 6.5–8.1)

## 2021-02-12 LAB — LIPID PANEL
Cholesterol: 134 mg/dL (ref 0–200)
HDL: 47 mg/dL (ref >40)
LDL Cholesterol: 78 mg/dL (ref 0–99)
Total CHOL/HDL Ratio: 2.9 RATIO
Triglycerides: 45 mg/dL (ref <150)
VLDL: 9 mg/dL (ref 0–40)

## 2021-02-12 NOTE — Telephone Encounter (Signed)
Contacted ambetter to follow up on appeal request initiated for [facility] [address]. They need copy of ECHO report showing reduced LVEF. Documentation faxed to [phone].
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
## 2021-02-12 NOTE — Patient Instructions (Addendum)
Stop Entresto [address] [name] 49/51 Twice daily   Labs done today, your results will be available in MyChart, we will contact you for abnormal readings.  Thank you for allowing [place] to provider your heart failure care after your recent hospitalization. Please follow-up with Dr. Doylene Canard on  02/20/2021 @ 11.30 am.  If you have any questions, issues, or concerns before your next appointment please call our office at [phone], opt. 2 and leave a message for the triage nurse.

## 2021-02-12 NOTE — Telephone Encounter (Signed)
Called to confirm Heart & Vascular Transitions of Care appointment at noon today 1/16. Patient reminded to bring all medications and pill box organizer with them. Confirmed patient has transportation. Gave directions, instructed to utilize [place] parking.  Confirmed appointment prior to ending call.   Pricilla Holm, MSN, RN Heart Failure Nurse Navigator [phone]

## 2021-02-12 NOTE — Progress Notes (Signed)
Samples of Entresto 49/51 Given to patient. Lot [id number] Exp 11/24

## 2021-02-12 NOTE — Addendum Note (Signed)
Encounter addended by: Jerl Mina, RN on: 02/12/2021 12:55 PM  Actions taken: Clinical Note Signed

## 2021-02-13 NOTE — TOC Benefit Eligibility Note (Signed)
Patient Advocate Encounter  Prior Authorization for Entresto 24-26 mg has been approved.    PA# [id number] Effective dates: 02/12/2021 through 02/13/2022      Lyndel Safe, [place] Patient Advocate Specialist [place] Patient Advocate Team Direct Number: [phone]  Fax: [phone]
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
## 2021-03-26 ENCOUNTER — Telehealth (HOSPITAL_COMMUNITY): Payer: Self-pay

## 2021-03-26 ENCOUNTER — Ambulatory Visit (HOSPITAL_COMMUNITY): Payer: Medicaid Other

## 2021-03-26 NOTE — Telephone Encounter (Signed)
Heart Failure Nurse Navigator Progress Note  Called patient for [facility] clinic appointment reminder. Pt stated he is currently at [facility] hospital with chest pain since Saturday, 2/25 after going to file his taxes. Inquired about medication compliance--pt states he is about to run out of "a lot of medications". Encouraged pt to follow up with HV TOC clinic, agreed. Rescheduled for March 8 @ 9AM. Will plan to call patient tomorrow for updates from current hospitalization.   Pricilla Holm, MSN, RN Heart Failure Nurse Navigator [phone]

## 2021-04-04 ENCOUNTER — Other Ambulatory Visit (HOSPITAL_COMMUNITY): Payer: Self-pay

## 2021-04-04 ENCOUNTER — Other Ambulatory Visit: Payer: Self-pay

## 2021-04-04 ENCOUNTER — Encounter (HOSPITAL_COMMUNITY): Payer: Self-pay

## 2021-04-04 ENCOUNTER — Telehealth (HOSPITAL_COMMUNITY): Payer: Self-pay | Admitting: Adult Health

## 2021-04-04 ENCOUNTER — Ambulatory Visit (HOSPITAL_COMMUNITY)
Admission: RE | Admit: 2021-04-04 | Discharge: 2021-04-04 | Disposition: A | Discharge status: Home / Self Care | Payer: No Typology Code available for payment source | Source: Ambulatory Visit | Attending: Adult Health | Admitting: Adult Health

## 2021-04-04 VITALS — BP 126/78 | HR 104 | Wt 347.0 lb

## 2021-04-04 DIAGNOSIS — Z79899 Other long term (current) drug therapy: Secondary | ICD-10-CM | POA: Diagnosis not present

## 2021-04-04 DIAGNOSIS — R0602 Shortness of breath: Secondary | ICD-10-CM | POA: Diagnosis present

## 2021-04-04 DIAGNOSIS — I110 Hypertensive heart disease with heart failure: Secondary | ICD-10-CM | POA: Insufficient documentation

## 2021-04-04 DIAGNOSIS — E119 Type 2 diabetes mellitus without complications: Secondary | ICD-10-CM | POA: Diagnosis not present

## 2021-04-04 DIAGNOSIS — I428 Other cardiomyopathies: Secondary | ICD-10-CM | POA: Diagnosis not present

## 2021-04-04 DIAGNOSIS — Z7901 Long term (current) use of anticoagulants: Secondary | ICD-10-CM | POA: Insufficient documentation

## 2021-04-04 DIAGNOSIS — I4891 Unspecified atrial fibrillation: Secondary | ICD-10-CM | POA: Diagnosis not present

## 2021-04-04 DIAGNOSIS — Z6841 Body Mass Index (BMI) 40.0 and over, adult: Secondary | ICD-10-CM | POA: Insufficient documentation

## 2021-04-04 DIAGNOSIS — G4733 Obstructive sleep apnea (adult) (pediatric): Secondary | ICD-10-CM | POA: Insufficient documentation

## 2021-04-04 DIAGNOSIS — I10 Essential (primary) hypertension: Secondary | ICD-10-CM

## 2021-04-04 DIAGNOSIS — I5022 Chronic systolic (congestive) heart failure: Secondary | ICD-10-CM | POA: Diagnosis not present

## 2021-04-04 DIAGNOSIS — Z7984 Long term (current) use of oral hypoglycemic drugs: Secondary | ICD-10-CM | POA: Diagnosis not present

## 2021-04-04 DIAGNOSIS — E785 Hyperlipidemia, unspecified: Secondary | ICD-10-CM | POA: Diagnosis not present

## 2021-04-04 LAB — BASIC METABOLIC PANEL
Anion gap: 10 (ref 5–15)
BUN: 13 mg/dL (ref 6–20)
CO2: 23 mmol/L (ref 22–32)
Calcium: 8.7 mg/dL — ABNORMAL LOW (ref 8.9–10.3)
Chloride: 105 mmol/L (ref 98–111)
Creatinine, Ser: 1.11 mg/dL (ref 0.61–1.24)
GFR, Estimated: >60 mL/min (ref >60)
Glucose, Bld: 116 mg/dL — ABNORMAL HIGH (ref 70–99)
Potassium: 4.3 mmol/L (ref 3.5–5.1)
Sodium: 138 mmol/L (ref 135–145)

## 2021-04-04 LAB — BRAIN NATRIURETIC PEPTIDE: B Natriuretic Peptide: 723.6 pg/mL — ABNORMAL HIGH (ref 0.0–100.0)

## 2021-04-04 MED ORDER — SPIRONOLACTONE 25 MG PO TABS: 12.5000 mg | ORAL_TABLET | Freq: Every day | ORAL | 3 refills | Status: DC

## 2021-04-04 MED ORDER — EMPAGLIFLOZIN 10 MG PO TABS
10.0000 mg | ORAL_TABLET | Freq: Every day | ORAL | 3 refills | Status: DC
  Filled 2021-04-04: qty 90, 90d supply, fill #0
  Filled 2021-06-27: qty 90, 90d supply, fill #1
  Filled 2021-09-29: qty 90, 90d supply, fill #2
  Filled 2021-12-21: qty 90, 90d supply, fill #3

## 2021-04-04 NOTE — Progress Notes (Signed)
ReDS Vest / Clip - 04/04/21 0900   ? ?  ? ReDS Vest / Clip  ? Station Marker D   ? Ruler Value 34   ? ReDS Value Range High volume overload   ? ReDS Actual Value 50   ? ?  ?  ? ?  ? ? ?

## 2021-04-04 NOTE — Telephone Encounter (Signed)
? ?  Called to follow up regarding furoscix infusion.  ? ?No issues with furoscix infusion. He reports brisk urine output.  ? ?Follow up on Friday.  ? ?Hadriel Northup NP-C  ? ?5:19 PM ?  ? ? ? ?
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
## 2021-04-04 NOTE — Patient Instructions (Addendum)
RESTART Spironolactone 12.5 mg, one half tab daily ?START Furoscix 80 mg via subcutaneous patch (patch will last 5-6 hours). Please see sticker below for details. ?-with each dose of furosix please take an extra 40 meq of potassium  ?-use one patch 04/04/2021 and use another patch 04/05/2021 ? ? ?Labs today ?We will only contact you if something comes back abnormal or we need to make some changes. ?Otherwise no news is good news! ? ?Your physician recommends that you schedule a follow-up appointment in: 2 days with Amy Clegg,NP ? ?Do the following things EVERYDAY: ?Weigh yourself in the morning before breakfast. Write it down and keep it in a log. ?Take your medicines as prescribed ?Eat low salt foods--Limit salt (sodium) to 2000 mg per day.  ?Stay as active as you can everyday ?Limit all fluids for the day to less than 2 liters ? ?At the [facility], you and your health needs are our priority. As part of our continuing mission to provide you with exceptional heart care, we have created designated Provider Care Teams. These Care Teams include your primary Cardiologist (physician) and Advanced Practice Providers (APPs- Physician Assistants and Nurse Practitioners) who all work together to provide you with the care you need, when you need it.  ? ?You may see any of the following providers on your designated Care Team at your next follow up: ?Dr Glori Bickers ?Dr Loralie Champagne ?Darrick Grinder, NP ?Lyda Jester, PA ?Jessica Milford,NP ?Marlyce Huge, PA ?Audry Riles, PharmD ? ? ?Please be sure to bring in all your medications bottles to every appointment.  ? ? ?

## 2021-04-04 NOTE — Progress Notes (Signed)
Medication Samples have been provided to the patient. ? ?Drug name: furoscix       Strength: '80mg'$         Qty: 1  LOT: [id number]  Exp.Date: 06/14/2022 ? ? ? ?The patient has been instructed regarding the correct time, dose, and frequency of taking this medication, including desired effects and most common side effects.  ? ?Kerry Dory ?10:20 AM ?04/04/2021 ? ?

## 2021-04-05 ENCOUNTER — Other Ambulatory Visit (HOSPITAL_COMMUNITY): Payer: Self-pay

## 2021-04-06 ENCOUNTER — Other Ambulatory Visit: Payer: Self-pay

## 2021-04-06 ENCOUNTER — Ambulatory Visit (HOSPITAL_COMMUNITY)
Admission: RE | Admit: 2021-04-06 | Discharge: 2021-04-06 | Disposition: A | Discharge status: Home / Self Care | Payer: No Typology Code available for payment source | Source: Ambulatory Visit | Attending: Family Medicine | Admitting: Family Medicine

## 2021-04-06 ENCOUNTER — Encounter (HOSPITAL_COMMUNITY): Payer: Self-pay

## 2021-04-06 ENCOUNTER — Other Ambulatory Visit (HOSPITAL_COMMUNITY): Payer: Self-pay

## 2021-04-06 VITALS — BP 108/72 | HR 81 | Wt 340.4 lb

## 2021-04-06 DIAGNOSIS — Z7984 Long term (current) use of oral hypoglycemic drugs: Secondary | ICD-10-CM | POA: Insufficient documentation

## 2021-04-06 DIAGNOSIS — Z7901 Long term (current) use of anticoagulants: Secondary | ICD-10-CM | POA: Insufficient documentation

## 2021-04-06 DIAGNOSIS — I10 Essential (primary) hypertension: Secondary | ICD-10-CM | POA: Diagnosis not present

## 2021-04-06 DIAGNOSIS — Z6841 Body Mass Index (BMI) 40.0 and over, adult: Secondary | ICD-10-CM | POA: Diagnosis not present

## 2021-04-06 DIAGNOSIS — R0602 Shortness of breath: Secondary | ICD-10-CM | POA: Insufficient documentation

## 2021-04-06 DIAGNOSIS — R0609 Other forms of dyspnea: Secondary | ICD-10-CM

## 2021-04-06 DIAGNOSIS — G4733 Obstructive sleep apnea (adult) (pediatric): Secondary | ICD-10-CM | POA: Insufficient documentation

## 2021-04-06 DIAGNOSIS — E785 Hyperlipidemia, unspecified: Secondary | ICD-10-CM | POA: Insufficient documentation

## 2021-04-06 DIAGNOSIS — I4891 Unspecified atrial fibrillation: Secondary | ICD-10-CM | POA: Insufficient documentation

## 2021-04-06 DIAGNOSIS — I480 Paroxysmal atrial fibrillation: Secondary | ICD-10-CM

## 2021-04-06 DIAGNOSIS — I5022 Chronic systolic (congestive) heart failure: Secondary | ICD-10-CM | POA: Diagnosis not present

## 2021-04-06 DIAGNOSIS — I428 Other cardiomyopathies: Secondary | ICD-10-CM | POA: Diagnosis not present

## 2021-04-06 DIAGNOSIS — E119 Type 2 diabetes mellitus without complications: Secondary | ICD-10-CM | POA: Insufficient documentation

## 2021-04-06 DIAGNOSIS — M2550 Pain in unspecified joint: Secondary | ICD-10-CM | POA: Diagnosis not present

## 2021-04-06 DIAGNOSIS — Z79899 Other long term (current) drug therapy: Secondary | ICD-10-CM | POA: Insufficient documentation

## 2021-04-06 DIAGNOSIS — I110 Hypertensive heart disease with heart failure: Secondary | ICD-10-CM | POA: Diagnosis not present

## 2021-04-06 LAB — BASIC METABOLIC PANEL
Anion gap: 9 (ref 5–15)
BUN: 12 mg/dL (ref 6–20)
CO2: 24 mmol/L (ref 22–32)
Calcium: 9.1 mg/dL (ref 8.9–10.3)
Chloride: 105 mmol/L (ref 98–111)
Creatinine, Ser: 1.09 mg/dL (ref 0.61–1.24)
GFR, Estimated: >60 mL/min (ref >60)
Glucose, Bld: 124 mg/dL — ABNORMAL HIGH (ref 70–99)
Potassium: 4.3 mmol/L (ref 3.5–5.1)
Sodium: 138 mmol/L (ref 135–145)

## 2021-04-06 MED ORDER — FUROSEMIDE 40 MG PO TABS: ORAL_TABLET | ORAL | 5 refills | Status: DC
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
## 2021-04-06 NOTE — Patient Instructions (Signed)
Labs done today. We will contact you only if your labs are abnormal. ? ?INCREASE Lasix to '80mg'$  (2 tablets) by mouth every morning and '40mg'$  (1 tablet) by mouth every evening.  ? ?No other medication changes were made. Please continue all current medications as prescribed. ? ?Your physician recommends that you schedule a follow-up appointment in: 2 weeks with our NP/[facility] here in our office.  ? ?If you have any questions or concerns before your next appointment please send [place] a message through [place] or call our office at [phone].   ? ?TO LEAVE A MESSAGE FOR THE NURSE SELECT OPTION 2, PLEASE LEAVE A MESSAGE INCLUDING: ?YOUR NAME ?DATE OF BIRTH ?CALL BACK NUMBER ?REASON FOR CALL**this is important as we prioritize the call backs ? ?YOU WILL RECEIVE A CALL BACK THE SAME DAY AS LONG AS YOU CALL BEFORE 4:00 PM ? ? ?Do the following things EVERYDAY: ?Weigh yourself in the morning before breakfast. Write it down and keep it in a log. ?Take your medicines as prescribed ?Eat low salt foods--Limit salt (sodium) to 2000 mg per day.  ?Stay as active as you can everyday ?Limit all fluids for the day to less than 2 liters ? ? ?At the [facility], you and your health needs are our priority. As part of our continuing mission to provide you with exceptional heart care, we have created designated Provider Care Teams. These Care Teams include your primary Cardiologist (physician) and Advanced Practice Providers (APPs- Physician Assistants and Nurse Practitioners) who all work together to provide you with the care you need, when you need it.  ? ?You may see any of the following providers on your designated Care Team at your next follow up: ?Dr Glori Bickers ?Dr Loralie Champagne ?Darrick Grinder, NP ?Lyda Jester, PA ?Audry Riles, PharmD ? ? ?Please be sure to bring in all your medications bottles to every appointment.  ? ?

## 2021-04-09 ENCOUNTER — Telehealth (HOSPITAL_COMMUNITY): Payer: Self-pay | Admitting: *Deleted

## 2021-04-09 NOTE — Telephone Encounter (Signed)
Pt left VM requesting refill. I called pt back to get the name of the medication he needed refills on no answer/no vm.  ?

## 2021-04-11 ENCOUNTER — Other Ambulatory Visit (HOSPITAL_COMMUNITY): Payer: Self-pay

## 2021-04-12 ENCOUNTER — Other Ambulatory Visit (HOSPITAL_COMMUNITY): Payer: Self-pay

## 2021-04-12 ENCOUNTER — Other Ambulatory Visit (HOSPITAL_COMMUNITY): Payer: Self-pay | Admitting: *Deleted

## 2021-04-12 MED ORDER — SACUBITRIL-VALSARTAN 24-26 MG PO TABS
1.0000 | ORAL_TABLET | Freq: Two times a day (BID) | ORAL | 3 refills | Status: DC
Start: 2021-04-12 — End: 2021-08-15
  Filled 2021-04-12: qty 60, 30d supply, fill #0

## 2021-04-19 ENCOUNTER — Ambulatory Visit (HOSPITAL_COMMUNITY)
Admission: RE | Admit: 2021-04-19 | Discharge: 2021-04-19 | Disposition: A | Discharge status: Home / Self Care | Payer: 59 | Source: Ambulatory Visit | Attending: Adult Health | Admitting: Adult Health

## 2021-04-19 ENCOUNTER — Other Ambulatory Visit: Payer: Self-pay

## 2021-04-19 ENCOUNTER — Encounter (HOSPITAL_COMMUNITY): Payer: Self-pay

## 2021-04-19 VITALS — BP 94/60 | HR 87 | Wt 338.0 lb

## 2021-04-19 DIAGNOSIS — I110 Hypertensive heart disease with heart failure: Secondary | ICD-10-CM | POA: Insufficient documentation

## 2021-04-19 DIAGNOSIS — Z79899 Other long term (current) drug therapy: Secondary | ICD-10-CM | POA: Insufficient documentation

## 2021-04-19 DIAGNOSIS — E119 Type 2 diabetes mellitus without complications: Secondary | ICD-10-CM | POA: Insufficient documentation

## 2021-04-19 DIAGNOSIS — I4891 Unspecified atrial fibrillation: Secondary | ICD-10-CM | POA: Insufficient documentation

## 2021-04-19 DIAGNOSIS — E785 Hyperlipidemia, unspecified: Secondary | ICD-10-CM | POA: Diagnosis not present

## 2021-04-19 DIAGNOSIS — Z7984 Long term (current) use of oral hypoglycemic drugs: Secondary | ICD-10-CM | POA: Diagnosis not present

## 2021-04-19 DIAGNOSIS — I10 Essential (primary) hypertension: Secondary | ICD-10-CM

## 2021-04-19 DIAGNOSIS — Z6841 Body Mass Index (BMI) 40.0 and over, adult: Secondary | ICD-10-CM | POA: Insufficient documentation

## 2021-04-19 DIAGNOSIS — I5022 Chronic systolic (congestive) heart failure: Secondary | ICD-10-CM | POA: Diagnosis not present

## 2021-04-19 DIAGNOSIS — I428 Other cardiomyopathies: Secondary | ICD-10-CM | POA: Diagnosis not present

## 2021-04-19 DIAGNOSIS — Z7901 Long term (current) use of anticoagulants: Secondary | ICD-10-CM | POA: Insufficient documentation

## 2021-04-19 DIAGNOSIS — R0602 Shortness of breath: Secondary | ICD-10-CM | POA: Insufficient documentation

## 2021-04-19 DIAGNOSIS — G4733 Obstructive sleep apnea (adult) (pediatric): Secondary | ICD-10-CM | POA: Diagnosis not present

## 2021-04-19 LAB — BASIC METABOLIC PANEL
Anion gap: 8 (ref 5–15)
BUN: 14 mg/dL (ref 6–20)
CO2: 25 mmol/L (ref 22–32)
Calcium: 9.1 mg/dL (ref 8.9–10.3)
Chloride: 108 mmol/L (ref 98–111)
Creatinine, Ser: 1.22 mg/dL (ref 0.61–1.24)
GFR, Estimated: >60 mL/min (ref >60)
Glucose, Bld: 115 mg/dL — ABNORMAL HIGH (ref 70–99)
Potassium: 4.1 mmol/L (ref 3.5–5.1)
Sodium: 141 mmol/L (ref 135–145)

## 2021-04-19 NOTE — Progress Notes (Signed)
ReDS Vest / Clip - 04/19/21 0900   ? ?  ? ReDS Vest / Clip  ? Station Marker D   ? Ruler Value 41   ? ReDS Value Range Low volume   ? ReDS Actual Value 35   ? ?  ?  ? ?  ? ? ?

## 2021-04-19 NOTE — Patient Instructions (Addendum)
Thank you for coming in today ? ?Labs were done today, if any labs are abnormal the clinic will call you ?No news is good news ? ?EKG was done today ? ?Your physician recommends that you schedule a follow-up appointment in:  ?3 weeks in clinic  ? ?At the [facility], you and your health needs are our priority. As part of our continuing mission to provide you with exceptional heart care, we have created designated Provider Care Teams. These Care Teams include your primary Cardiologist (physician) and Advanced Practice Providers (APPs- Physician Assistants and Nurse Practitioners) who all work together to provide you with the care you need, when you need it.  ? ?You may see any of the following providers on your designated Care Team at your next follow up: ?Dr Glori Bickers ?Dr Loralie Champagne ?Darrick Grinder, NP ?Lyda Jester, PA ?Jessica Milford,NP ?Marlyce Huge, PA ?Audry Riles, PharmD ? ? ?Please be sure to bring in all your medications bottles to every appointment.  ? ?If you have any questions or concerns before your next appointment please send [place] a message through [place] or call our office at [phone].   ? ?TO LEAVE A MESSAGE FOR THE NURSE SELECT OPTION 2, PLEASE LEAVE A MESSAGE INCLUDING: ?YOUR NAME ?DATE OF BIRTH ?CALL BACK NUMBER ?REASON FOR CALL**this is important as we prioritize the call backs ? ?YOU WILL RECEIVE A CALL BACK THE SAME DAY AS LONG AS YOU CALL BEFORE 4:00 PM ? ?
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
## 2021-05-02 ENCOUNTER — Telehealth (HOSPITAL_COMMUNITY): Payer: Self-pay | Admitting: Cardiology

## 2021-05-02 NOTE — Telephone Encounter (Addendum)
[place] ?AUTH VALID 04/05/21-49/23 ?#4 UNITS ?CO PAY INFORMATION UNAVAILABLE  ? ?

## 2021-05-10 ENCOUNTER — Emergency Department (HOSPITAL_BASED_OUTPATIENT_CLINIC_OR_DEPARTMENT_OTHER)
Admission: EM | Admit: 2021-05-10 | Discharge: 2021-05-10 | Disposition: A | Discharge status: Home / Self Care | Payer: No Typology Code available for payment source | Attending: Emergency Medicine | Admitting: Emergency Medicine

## 2021-05-10 ENCOUNTER — Other Ambulatory Visit: Payer: Self-pay

## 2021-05-10 ENCOUNTER — Encounter (HOSPITAL_BASED_OUTPATIENT_CLINIC_OR_DEPARTMENT_OTHER): Payer: Self-pay | Admitting: Emergency Medicine

## 2021-05-10 ENCOUNTER — Emergency Department (HOSPITAL_BASED_OUTPATIENT_CLINIC_OR_DEPARTMENT_OTHER): Payer: No Typology Code available for payment source

## 2021-05-10 DIAGNOSIS — Z20822 Contact with and (suspected) exposure to covid-19: Secondary | ICD-10-CM | POA: Diagnosis not present

## 2021-05-10 DIAGNOSIS — Z7984 Long term (current) use of oral hypoglycemic drugs: Secondary | ICD-10-CM | POA: Insufficient documentation

## 2021-05-10 DIAGNOSIS — I110 Hypertensive heart disease with heart failure: Secondary | ICD-10-CM | POA: Insufficient documentation

## 2021-05-10 DIAGNOSIS — I5022 Chronic systolic (congestive) heart failure: Secondary | ICD-10-CM | POA: Diagnosis not present

## 2021-05-10 DIAGNOSIS — Z79899 Other long term (current) drug therapy: Secondary | ICD-10-CM | POA: Diagnosis not present

## 2021-05-10 DIAGNOSIS — R42 Dizziness and giddiness: Secondary | ICD-10-CM | POA: Insufficient documentation

## 2021-05-10 DIAGNOSIS — R519 Headache, unspecified: Secondary | ICD-10-CM | POA: Diagnosis not present

## 2021-05-10 DIAGNOSIS — Z7901 Long term (current) use of anticoagulants: Secondary | ICD-10-CM | POA: Diagnosis not present

## 2021-05-10 DIAGNOSIS — E119 Type 2 diabetes mellitus without complications: Secondary | ICD-10-CM | POA: Diagnosis not present

## 2021-05-10 DIAGNOSIS — R0789 Other chest pain: Secondary | ICD-10-CM | POA: Insufficient documentation

## 2021-05-10 LAB — BASIC METABOLIC PANEL
Anion gap: 10 (ref 5–15)
BUN: 16 mg/dL (ref 6–20)
CO2: 23 mmol/L (ref 22–32)
Calcium: 8.8 mg/dL — ABNORMAL LOW (ref 8.9–10.3)
Chloride: 103 mmol/L (ref 98–111)
Creatinine, Ser: 1.2 mg/dL (ref 0.61–1.24)
GFR, Estimated: >60 mL/min (ref >60)
Glucose, Bld: 188 mg/dL — ABNORMAL HIGH (ref 70–99)
Potassium: 4.5 mmol/L (ref 3.5–5.1)
Sodium: 136 mmol/L (ref 135–145)

## 2021-05-10 LAB — CBC
HCT: 58.6 % — ABNORMAL HIGH (ref 39.0–52.0)
Hemoglobin: 18.3 g/dL — ABNORMAL HIGH (ref 13.0–17.0)
MCH: 25.3 pg — ABNORMAL LOW (ref 26.0–34.0)
MCHC: 31.2 g/dL (ref 30.0–36.0)
MCV: 81.1 fL (ref 80.0–100.0)
Platelets: 351 10*3/uL (ref 150–400)
RBC: 7.23 MIL/uL — ABNORMAL HIGH (ref 4.22–5.81)
RDW: 19 % — ABNORMAL HIGH (ref 11.5–15.5)
WBC: 11.7 10*3/uL — ABNORMAL HIGH (ref 4.0–10.5)
nRBC: 0 % (ref 0.0–0.2)

## 2021-05-10 LAB — HEPATIC FUNCTION PANEL
ALT: 41 U/L (ref 0–44)
AST: 39 U/L (ref 15–41)
Albumin: 3.8 g/dL (ref 3.5–5.0)
Alkaline Phosphatase: 82 U/L (ref 38–126)
Bilirubin, Direct: 0.4 mg/dL — ABNORMAL HIGH (ref 0.0–0.2)
Indirect Bilirubin: 1 mg/dL — ABNORMAL HIGH (ref 0.3–0.9)
Total Bilirubin: 1.4 mg/dL — ABNORMAL HIGH (ref 0.3–1.2)
Total Protein: 7.5 g/dL (ref 6.5–8.1)

## 2021-05-10 LAB — TROPONIN I (HIGH SENSITIVITY)
Troponin I (High Sensitivity): 20 ng/L — ABNORMAL HIGH (ref <18)
Troponin I (High Sensitivity): 19 ng/L — ABNORMAL HIGH (ref <18)

## 2021-05-10 LAB — RESP PANEL BY RT-PCR (FLU A&B, COVID) ARPGX2
Influenza A by PCR: NEGATIVE
Influenza B by PCR: NEGATIVE
SARS Coronavirus 2 by RT PCR: NEGATIVE

## 2021-05-10 LAB — BRAIN NATRIURETIC PEPTIDE: B Natriuretic Peptide: 458.7 pg/mL — ABNORMAL HIGH (ref 0.0–100.0)

## 2021-05-10 MED ORDER — PROCHLORPERAZINE MALEATE 10 MG PO TABS
10.0000 mg | ORAL_TABLET | Freq: Once | ORAL | Status: AC
  Administered 2021-05-10: 10 mg via ORAL
  Filled 2021-05-10: qty 1

## 2021-05-10 MED ORDER — ACETAMINOPHEN 500 MG PO TABS
1000.0000 mg | ORAL_TABLET | Freq: Four times a day (QID) | ORAL | Status: DC | PRN
  Administered 2021-05-10: 1000 mg via ORAL
  Filled 2021-05-10: qty 2

## 2021-05-10 NOTE — ED Notes (Signed)
Called Dr. Dixie Dials ?
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
## 2021-05-10 NOTE — ED Triage Notes (Signed)
Chest pain since yesterday, some sob, no n/v, no diaphoresis.  Central chest, non radiating. New hx afib ?

## 2021-05-10 NOTE — ED Notes (Signed)
Patient transported to X-ray 
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
## 2021-05-16 ENCOUNTER — Ambulatory Visit (HOSPITAL_COMMUNITY)
Admission: RE | Admit: 2021-05-16 | Discharge: 2021-05-16 | Disposition: A | Discharge status: Home / Self Care | Payer: 59 | Source: Ambulatory Visit | Attending: Family Medicine | Admitting: Family Medicine

## 2021-05-16 ENCOUNTER — Encounter (HOSPITAL_COMMUNITY): Payer: Self-pay

## 2021-05-16 VITALS — BP 132/98 | HR 82 | Wt 334.2 lb

## 2021-05-16 DIAGNOSIS — E785 Hyperlipidemia, unspecified: Secondary | ICD-10-CM | POA: Diagnosis not present

## 2021-05-16 DIAGNOSIS — E6609 Other obesity due to excess calories: Secondary | ICD-10-CM | POA: Diagnosis not present

## 2021-05-16 DIAGNOSIS — Z7984 Long term (current) use of oral hypoglycemic drugs: Secondary | ICD-10-CM | POA: Diagnosis not present

## 2021-05-16 DIAGNOSIS — Z6841 Body Mass Index (BMI) 40.0 and over, adult: Secondary | ICD-10-CM | POA: Insufficient documentation

## 2021-05-16 DIAGNOSIS — R519 Headache, unspecified: Secondary | ICD-10-CM | POA: Diagnosis not present

## 2021-05-16 DIAGNOSIS — Z7901 Long term (current) use of anticoagulants: Secondary | ICD-10-CM | POA: Diagnosis not present

## 2021-05-16 DIAGNOSIS — I110 Hypertensive heart disease with heart failure: Secondary | ICD-10-CM | POA: Insufficient documentation

## 2021-05-16 DIAGNOSIS — Z713 Dietary counseling and surveillance: Secondary | ICD-10-CM | POA: Insufficient documentation

## 2021-05-16 DIAGNOSIS — I4891 Unspecified atrial fibrillation: Secondary | ICD-10-CM

## 2021-05-16 DIAGNOSIS — E119 Type 2 diabetes mellitus without complications: Secondary | ICD-10-CM | POA: Diagnosis not present

## 2021-05-16 DIAGNOSIS — Z79899 Other long term (current) drug therapy: Secondary | ICD-10-CM | POA: Insufficient documentation

## 2021-05-16 DIAGNOSIS — G4733 Obstructive sleep apnea (adult) (pediatric): Secondary | ICD-10-CM

## 2021-05-16 DIAGNOSIS — I5022 Chronic systolic (congestive) heart failure: Secondary | ICD-10-CM

## 2021-05-16 DIAGNOSIS — I10 Essential (primary) hypertension: Secondary | ICD-10-CM

## 2021-05-16 LAB — CBC
HCT: 59.3 % — ABNORMAL HIGH (ref 39.0–52.0)
Hemoglobin: 19 g/dL — ABNORMAL HIGH (ref 13.0–17.0)
MCH: 26.1 pg (ref 26.0–34.0)
MCHC: 32 g/dL (ref 30.0–36.0)
MCV: 81.3 fL (ref 80.0–100.0)
Platelets: 303 10*3/uL (ref 150–400)
RBC: 7.29 MIL/uL — ABNORMAL HIGH (ref 4.22–5.81)
RDW: 19.4 % — ABNORMAL HIGH (ref 11.5–15.5)
WBC: 10.2 10*3/uL (ref 4.0–10.5)
nRBC: 0 % (ref 0.0–0.2)

## 2021-05-16 LAB — BASIC METABOLIC PANEL
Anion gap: 9 (ref 5–15)
BUN: 14 mg/dL (ref 6–20)
CO2: 22 mmol/L (ref 22–32)
Calcium: 8.9 mg/dL (ref 8.9–10.3)
Chloride: 105 mmol/L (ref 98–111)
Creatinine, Ser: 1.12 mg/dL (ref 0.61–1.24)
GFR, Estimated: >60 mL/min (ref >60)
Glucose, Bld: 127 mg/dL — ABNORMAL HIGH (ref 70–99)
Potassium: 4.2 mmol/L (ref 3.5–5.1)
Sodium: 136 mmol/L (ref 135–145)

## 2021-05-16 LAB — BRAIN NATRIURETIC PEPTIDE: B Natriuretic Peptide: 428.5 pg/mL — ABNORMAL HIGH (ref 0.0–100.0)

## 2021-05-16 NOTE — Progress Notes (Signed)
ReDS Vest / Clip - 05/16/21 0800   ? ?  ? ReDS Vest / Clip  ? Station Marker D   ? Ruler Value 47   ? ReDS Value Range Low volume   ? ReDS Actual Value 31   ? ?  ?  ? ?  ? ? ?

## 2021-05-16 NOTE — Addendum Note (Signed)
Encounter addended by: Rafael Bihari, FNP on: 05/16/2021 4:29 PM ? Actions taken: Clinical Note Signed

## 2021-05-16 NOTE — Addendum Note (Signed)
Encounter addended by: Payton Mccallum, RN on: 05/16/2021 9:30 AM ? Actions taken: Order list changed, Diagnosis association updated, Charge Capture section accepted, Flowsheet accepted, Pend clinical note, Clinical Note Signed

## 2021-05-16 NOTE — Patient Instructions (Addendum)
Thank you for coming in today ? ?Labs were done today, if any labs are abnormal the clinic will call you ?No news is good news  ? ? ?You are scheduled for a TEE/Cardioversion/TEE Cardioversion on 05/28/2021 with Dr. Haroldine Laws.  Please arrive at the [facility] (Main Entrance A) at [facility]: [address] [place], [place] [address] at 6:30 am. (1 hour prior to procedure unless lab work is needed; if lab work is needed arrive 1.5 hours ahead) ? ?DIET: Nothing to eat or drink after midnight except a sip of water with medications (see medication instructions below) ? ?Medication Instructions: ?Hold Lasix and Spironolactone ? ? ?Labs: If patient is on Coumadin, patient needs pt/INR, CBC, BMET within 3 days (No pt/INR needed for patients taking Xarelto, Eliquis, Pradaxa) ?For patients receiving anesthesia for TEE and all Cardioversion patients: BMET, CBC within 1 week ? ? ? ?You must have a responsible person to drive you home and stay in the waiting area during your procedure. Failure to do so could result in cancellation. ? ?Interior and spatial designer cards. ? ?*Special Note: Every effort is made to have your procedure done on time. Occasionally there are emergencies that occur at the hospital that may cause delays. Please be patient if a delay does occur.   ? ?Your physician recommends that you schedule a follow-up appointment in: 2 weeks after procedure ?3 months with echocardiogram with Dr. Haroldine Laws ? ?Your physician has requested that you have an echocardiogram. Echocardiography is a painless test that uses sound waves to create images of your heart. It provides your doctor with information about the size and shape of your heart and how well your heart?s chambers and valves are working. This procedure takes approximately one hour. There are no restrictions for this procedure. ?  ?At the [facility], you and your health needs are our priority. As part of our continuing mission to provide you  with exceptional heart care, we have created designated Provider Care Teams. These Care Teams include your primary Cardiologist (physician) and Advanced Practice Providers (APPs- Physician Assistants and Nurse Practitioners) who all work together to provide you with the care you need, when you need it.  ? ?You may see any of the following providers on your designated Care Team at your next follow up: ?Dr Glori Bickers ?Dr Loralie Champagne ?Darrick Grinder, NP ?Lyda Jester, PA ?Jessica Milford,NP ?Marlyce Huge, PA ?Audry Riles, PharmD ? ? ?Please be sure to bring in all your medications bottles to every appointment.  ? ?If you have any questions or concerns before your next appointment please send [place] a message through [place] or call our office at [phone].   ? ?TO LEAVE A MESSAGE FOR THE NURSE SELECT OPTION 2, PLEASE LEAVE A MESSAGE INCLUDING: ?YOUR NAME ?DATE OF BIRTH ?CALL BACK NUMBER ?REASON FOR CALL**this is important as we prioritize the call backs ? ?YOU WILL RECEIVE A CALL BACK THE SAME DAY AS LONG AS YOU CALL BEFORE 4:00 PM ? ?

## 2021-05-18 ENCOUNTER — Other Ambulatory Visit (HOSPITAL_COMMUNITY): Payer: Self-pay

## 2021-05-18 DIAGNOSIS — I4891 Unspecified atrial fibrillation: Secondary | ICD-10-CM

## 2021-05-21 ENCOUNTER — Encounter (HOSPITAL_COMMUNITY): Payer: Self-pay | Admitting: Internal Medicine

## 2021-05-25 ENCOUNTER — Other Ambulatory Visit (HOSPITAL_COMMUNITY): Payer: Self-pay | Admitting: Adult Health

## 2021-05-28 ENCOUNTER — Ambulatory Visit (HOSPITAL_BASED_OUTPATIENT_CLINIC_OR_DEPARTMENT_OTHER): Payer: 59 | Admitting: Certified Registered"

## 2021-05-28 ENCOUNTER — Other Ambulatory Visit: Payer: Self-pay

## 2021-05-28 ENCOUNTER — Ambulatory Visit (HOSPITAL_COMMUNITY)
Admission: RE | Admit: 2021-05-28 | Discharge: 2021-05-28 | Disposition: A | Discharge status: Home / Self Care | Payer: 59 | Source: Ambulatory Visit | Attending: Internal Medicine | Admitting: Internal Medicine

## 2021-05-28 ENCOUNTER — Encounter (HOSPITAL_COMMUNITY)
Admission: RE | Disposition: A | Discharge status: Home / Self Care | Payer: Self-pay | Source: Ambulatory Visit | Attending: Internal Medicine

## 2021-05-28 ENCOUNTER — Ambulatory Visit (HOSPITAL_COMMUNITY): Payer: 59 | Admitting: Certified Registered"

## 2021-05-28 ENCOUNTER — Encounter (HOSPITAL_COMMUNITY): Payer: Self-pay | Admitting: Internal Medicine

## 2021-05-28 DIAGNOSIS — I110 Hypertensive heart disease with heart failure: Secondary | ICD-10-CM | POA: Insufficient documentation

## 2021-05-28 DIAGNOSIS — I10 Essential (primary) hypertension: Secondary | ICD-10-CM | POA: Diagnosis not present

## 2021-05-28 DIAGNOSIS — Z7901 Long term (current) use of anticoagulants: Secondary | ICD-10-CM | POA: Insufficient documentation

## 2021-05-28 DIAGNOSIS — R519 Headache, unspecified: Secondary | ICD-10-CM | POA: Insufficient documentation

## 2021-05-28 DIAGNOSIS — E669 Obesity, unspecified: Secondary | ICD-10-CM | POA: Diagnosis not present

## 2021-05-28 DIAGNOSIS — Z6841 Body Mass Index (BMI) 40.0 and over, adult: Secondary | ICD-10-CM | POA: Diagnosis not present

## 2021-05-28 DIAGNOSIS — G4733 Obstructive sleep apnea (adult) (pediatric): Secondary | ICD-10-CM | POA: Insufficient documentation

## 2021-05-28 DIAGNOSIS — E119 Type 2 diabetes mellitus without complications: Secondary | ICD-10-CM

## 2021-05-28 DIAGNOSIS — E785 Hyperlipidemia, unspecified: Secondary | ICD-10-CM | POA: Insufficient documentation

## 2021-05-28 DIAGNOSIS — Z87891 Personal history of nicotine dependence: Secondary | ICD-10-CM | POA: Diagnosis not present

## 2021-05-28 DIAGNOSIS — Z7984 Long term (current) use of oral hypoglycemic drugs: Secondary | ICD-10-CM | POA: Insufficient documentation

## 2021-05-28 DIAGNOSIS — I252 Old myocardial infarction: Secondary | ICD-10-CM | POA: Diagnosis not present

## 2021-05-28 DIAGNOSIS — I4891 Unspecified atrial fibrillation: Secondary | ICD-10-CM | POA: Diagnosis present

## 2021-05-28 DIAGNOSIS — Z79899 Other long term (current) drug therapy: Secondary | ICD-10-CM | POA: Diagnosis not present

## 2021-05-28 DIAGNOSIS — I428 Other cardiomyopathies: Secondary | ICD-10-CM | POA: Insufficient documentation

## 2021-05-28 DIAGNOSIS — I5022 Chronic systolic (congestive) heart failure: Secondary | ICD-10-CM | POA: Diagnosis not present

## 2021-05-28 HISTORY — PX: CARDIOVERSION: SHX1299

## 2021-05-28 LAB — GLUCOSE, CAPILLARY: Glucose-Capillary: 92 mg/dL (ref 70–99)

## 2021-05-28 SURGERY — CARDIOVERSION
Anesthesia: Monitor Anesthesia Care

## 2021-05-28 MED ORDER — LACTATED RINGERS IV SOLN
INTRAVENOUS | Status: DC | PRN
Start: 2021-05-28 — End: 2021-05-28

## 2021-05-28 MED ORDER — SODIUM CHLORIDE 0.9 % IV SOLN: INTRAVENOUS | Status: DC

## 2021-05-28 MED ORDER — PROPOFOL 10 MG/ML IV BOLUS
INTRAVENOUS | Status: DC | PRN
  Administered 2021-05-28: 80 mg via INTRAVENOUS

## 2021-05-28 MED ORDER — LIDOCAINE HCL (CARDIAC) PF 100 MG/5ML IV SOSY
PREFILLED_SYRINGE | INTRAVENOUS | Status: DC | PRN
  Administered 2021-05-28: 100 mg via INTRAVENOUS

## 2021-05-28 NOTE — CV Procedure (Signed)
? ?   DIRECT CURRENT CARDIOVERSION ? ?NAME:  John Conner   MRN: [id number] ?DOB:  Nov 10, 1960   ADMIT DATE: 05/28/2021 ? ? ?INDICATIONS: Atrial fibrillation  ? ? ?PROCEDURE:  ? ?Informed consent was obtained prior to the procedure. The risks, benefits and alternatives for the procedure were discussed and the patient comprehended these risks. Once an appropriate time out was taken, the patient had the defibrillator pads placed in the anterior and posterior position. The patient then underwent sedation by the anesthesia service. Once an appropriate level of sedation was achieved, the patient received a single biphasic, synchronized 200J shock with prompt conversion to sinus rhythm. No apparent complications. ? ?Glori Bickers, MD  ?7:42 AM ? ?

## 2021-05-28 NOTE — Interval H&P Note (Signed)
History and Physical Interval Note: ? ?05/28/2021 ?7:36 AM ? ?John Conner  has presented today for surgery, with the diagnosis of AFIB.  The various methods of treatment have been discussed with the patient and family. After consideration of risks, benefits and other options for treatment, the patient has consented to  Procedure(s): ?CARDIOVERSION (N/A) as a surgical intervention.  The patient's history has been reviewed, patient examined, no change in status, stable for surgery.  I have reviewed the patient's chart and labs.  Questions were answered to the patient's satisfaction.   ? ? ?[name] ? ? ?

## 2021-05-28 NOTE — Discharge Instructions (Signed)

## 2021-05-28 NOTE — Transfer of Care (Signed)
Immediate Anesthesia Transfer of Care Note ? ?Patient: John Conner ? ?Procedure(s) Performed: CARDIOVERSION ? ?Patient Location: PACU ? ?Anesthesia Type:General ? ?Level of Consciousness: drowsy and patient cooperative ? ?Airway & Oxygen Therapy: Patient Spontanous Breathing and Patient connected to nasal cannula oxygen ? ?Post-op Assessment: Report given to RN and Post -op Vital signs reviewed and stable ? ?Post vital signs: Reviewed and stable ? ?Last Vitals:  ?Vitals Value Taken Time  ?BP    ?Temp    ?Pulse    ?Resp    ?SpO2    ? ? ?Last Pain: There were no vitals filed for this visit.   ? ?  ? ?Complications: No notable events documented. ?
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
## 2021-05-28 NOTE — Anesthesia Postprocedure Evaluation (Signed)
Anesthesia Post Note ? ?Patient: John Conner ? ?Procedure(s) Performed: CARDIOVERSION ? ?  ? ?Patient location during evaluation: PACU ?Anesthesia Type: MAC ?Level of consciousness: awake and alert ?Pain management: pain level controlled ?Vital Signs Assessment: post-procedure vital signs reviewed and stable ?Respiratory status: spontaneous breathing, nonlabored ventilation, respiratory function stable and patient connected to nasal cannula oxygen ?Cardiovascular status: stable and blood pressure returned to baseline ?Postop Assessment: no apparent nausea or vomiting ?Anesthetic complications: no ? ? ?No notable events documented. ? ?Last Vitals:  ?Vitals:  ? 05/28/21 0748 05/28/21 0758  ?BP: 110/60 104/66  ?Pulse: 78 66  ?Resp: 18 16  ?Temp: 36.7 ?C   ?SpO2: 94% 96%  ?  ?Last Pain:  ?Vitals:  ? 05/28/21 0758  ?TempSrc:   ?PainSc: 0-No pain  ? ? ?  ?  ?  ?  ?  ?  ? ?Kerron Sedano S ? ? ? ? ?

## 2021-05-29 ENCOUNTER — Encounter (HOSPITAL_COMMUNITY): Payer: Self-pay | Admitting: Internal Medicine

## 2021-06-01 ENCOUNTER — Other Ambulatory Visit: Payer: Self-pay

## 2021-06-01 ENCOUNTER — Emergency Department (HOSPITAL_BASED_OUTPATIENT_CLINIC_OR_DEPARTMENT_OTHER)
Admission: EM | Admit: 2021-06-01 | Discharge: 2021-06-01 | Disposition: A | Discharge status: Home / Self Care | Payer: 59 | Attending: Emergency Medicine | Admitting: Emergency Medicine

## 2021-06-01 ENCOUNTER — Encounter (HOSPITAL_BASED_OUTPATIENT_CLINIC_OR_DEPARTMENT_OTHER): Payer: Self-pay | Admitting: Emergency Medicine

## 2021-06-01 DIAGNOSIS — I509 Heart failure, unspecified: Secondary | ICD-10-CM | POA: Diagnosis not present

## 2021-06-01 DIAGNOSIS — I110 Hypertensive heart disease with heart failure: Secondary | ICD-10-CM | POA: Diagnosis not present

## 2021-06-01 DIAGNOSIS — Z7901 Long term (current) use of anticoagulants: Secondary | ICD-10-CM | POA: Insufficient documentation

## 2021-06-01 DIAGNOSIS — E119 Type 2 diabetes mellitus without complications: Secondary | ICD-10-CM | POA: Insufficient documentation

## 2021-06-01 DIAGNOSIS — Z7984 Long term (current) use of oral hypoglycemic drugs: Secondary | ICD-10-CM | POA: Diagnosis not present

## 2021-06-01 DIAGNOSIS — R42 Dizziness and giddiness: Secondary | ICD-10-CM | POA: Insufficient documentation

## 2021-06-01 LAB — COMPREHENSIVE METABOLIC PANEL
ALT: 33 U/L (ref 0–44)
AST: 28 U/L (ref 15–41)
Albumin: 4.1 g/dL (ref 3.5–5.0)
Alkaline Phosphatase: 85 U/L (ref 38–126)
Anion gap: 11 (ref 5–15)
BUN: 13 mg/dL (ref 6–20)
CO2: 25 mmol/L (ref 22–32)
Calcium: 9.3 mg/dL (ref 8.9–10.3)
Chloride: 101 mmol/L (ref 98–111)
Creatinine, Ser: 1.17 mg/dL (ref 0.61–1.24)
GFR, Estimated: >60 mL/min (ref >60)
Glucose, Bld: 128 mg/dL — ABNORMAL HIGH (ref 70–99)
Potassium: 3.8 mmol/L (ref 3.5–5.1)
Sodium: 137 mmol/L (ref 135–145)
Total Bilirubin: 1.4 mg/dL — ABNORMAL HIGH (ref 0.3–1.2)
Total Protein: 7.7 g/dL (ref 6.5–8.1)

## 2021-06-01 LAB — CBC WITH DIFFERENTIAL/PLATELET
Abs Immature Granulocytes: 0.03 10*3/uL (ref 0.00–0.07)
Basophils Absolute: 0.1 10*3/uL (ref 0.0–0.1)
Basophils Relative: 1 %
Eosinophils Absolute: 0.3 10*3/uL (ref 0.0–0.5)
Eosinophils Relative: 3 %
HCT: 60.1 % — ABNORMAL HIGH (ref 39.0–52.0)
Hemoglobin: 19 g/dL — ABNORMAL HIGH (ref 13.0–17.0)
Immature Granulocytes: 0 %
Lymphocytes Relative: 35 %
Lymphs Abs: 3.8 10*3/uL (ref 0.7–4.0)
MCH: 25.6 pg — ABNORMAL LOW (ref 26.0–34.0)
MCHC: 31.6 g/dL (ref 30.0–36.0)
MCV: 80.9 fL (ref 80.0–100.0)
Monocytes Absolute: 0.8 10*3/uL (ref 0.1–1.0)
Monocytes Relative: 7 %
Neutro Abs: 5.6 10*3/uL (ref 1.7–7.7)
Neutrophils Relative %: 54 %
Platelets: 318 10*3/uL (ref 150–400)
RBC: 7.43 MIL/uL — ABNORMAL HIGH (ref 4.22–5.81)
RDW: 19.9 % — ABNORMAL HIGH (ref 11.5–15.5)
WBC: 10.6 10*3/uL — ABNORMAL HIGH (ref 4.0–10.5)
nRBC: 0 % (ref 0.0–0.2)

## 2021-06-01 LAB — CBG MONITORING, ED: Glucose-Capillary: 149 mg/dL — ABNORMAL HIGH (ref 70–99)

## 2021-06-01 LAB — TROPONIN I (HIGH SENSITIVITY)
Troponin I (High Sensitivity): 19 ng/L — ABNORMAL HIGH (ref <18)
Troponin I (High Sensitivity): 14 ng/L (ref <18)

## 2021-06-01 MED ORDER — KETOROLAC TROMETHAMINE 15 MG/ML IJ SOLN
15.0000 mg | Freq: Once | INTRAMUSCULAR | Status: AC
  Administered 2021-06-01: 15 mg via INTRAVENOUS
  Filled 2021-06-01: qty 1

## 2021-06-01 MED ORDER — SODIUM CHLORIDE 0.9 % IV BOLUS
500.0000 mL | Freq: Once | INTRAVENOUS | Status: AC
  Administered 2021-06-01: 500 mL via INTRAVENOUS

## 2021-06-01 NOTE — ED Provider Notes (Signed)
?[facility] ?Provider Note ? ? ?CSN: [id number] ?Arrival date & time: 06/01/21  3149 ? ?  ? ?History ? ?Chief Complaint  ?Patient presents with  ? Headache  ? ? ?John Conner is a 61 y.o. male with a past medical history of hypertension, sleep apnea, obesity, CHF, type 2 diabetes and NSTEMI 01/2021 presenting due to intermittent dizziness.  Says that he has been having more frequent dizzy spells since his cardioversion on Monday.  He says that these dizzy spells, multiple times a day, unprovoked.  Says that they last 5 to 8 seconds.  He says the dizziness feels like he is spinning.  When the dizziness comes on he also gets headaches 10 to 20 minutes later.  He has been taking Tylenol which somewhat helps.  Denies shortness of breath but says that he has waxing and waning chest pain but is no worse since cardioversion. Also complaining of left lower dental pain but says that he has an appointment with a dentist next week ? ? ?Headache ?Associated symptoms: dizziness   ?Associated symptoms: no diarrhea, no nausea, no photophobia, no vomiting and no weakness   ? ?  ? ?Home Medications ?Prior to Admission medications   ?Medication Sig Start Date End Date Taking? Authorizing Provider  ?apixaban (ELIQUIS) 5 MG TABS tablet Take 5 mg by mouth 2 (two) times daily.    [provider]  ?atorvastatin (LIPITOR) 80 MG tablet Take 1 tablet (80 mg total) by mouth daily. ?Patient taking differently: Take 80 mg by mouth daily at 6 PM. 02/08/21   Consuelo Pandy, PA-C  ?carvedilol (COREG) 12.5 MG tablet Take 12.5 mg by mouth 2 (two) times daily with a meal.    [provider]  ?Cholecalciferol (VITAMIN D3) 10 MCG (400 UNIT) tablet Take 400 Units by mouth daily.    [provider]  ?empagliflozin (JARDIANCE) 10 MG TABS tablet Take 1 tablet (10 mg total) by mouth daily. 04/04/21   Clegg, Amy D, NP  ?fluticasone (FLONASE) 50 MCG/ACT nasal spray Place 1 spray into both nostrils as  needed for allergies or rhinitis.    [provider]  ?furosemide (LASIX) 40 MG tablet Take 2 tablets (80 mg total) by mouth every morning AND 1 tablet (40 mg total) every evening. 04/06/21   Clegg, Amy D, NP  ?metFORMIN (GLUCOPHAGE) 500 MG tablet Take 500 mg by mouth 2 (two) times daily with a meal.    [provider]  ?potassium chloride SA (KLOR-CON M) 20 MEQ tablet Take 1 tablet (20 mEq total) by mouth daily. 02/08/21   Domenic Polite, MD  ?sacubitril-valsartan (ENTRESTO) 24-26 MG Take 1 tablet by mouth 2 (two) times daily. 04/12/21   Clegg, Amy D, NP  ?spironolactone (ALDACTONE) 25 MG tablet TAKE 1/2 TABLET BY MOUTH DAILY. 05/25/21   Bensimhon, Shaune Pascal, MD  ?   ? ?Allergies    ?Diphenhydramine hcl and Lisinopril   ? ?Review of Systems   ?Review of Systems  ?Eyes:  Negative for photophobia and visual disturbance.  ?Respiratory:  Negative for chest tightness and shortness of breath.   ?Cardiovascular:  Negative for chest pain.  ?Gastrointestinal:  Negative for diarrhea, nausea and vomiting.  ?Neurological:  Positive for dizziness and headaches. Negative for syncope and weakness.  ? ?Physical Exam ?Updated Vital Signs ?BP 124/77   Pulse 82   Temp 98.6 ?F (37 ?C)   Resp 13   Ht '5\' 10"'$  (1.778 m)   Wt (!) 154.7 kg  SpO2 96%   BMI 48.93 kg/m?  ?Physical Exam ?Vitals and nursing note reviewed.  ?Constitutional:   ?   Appearance: Normal appearance.  ?HENT:  ?   Head: Normocephalic and atraumatic.  ?   Mouth/Throat:  ?   Mouth: Mucous membranes are moist.  ? ?   Comments: Scattered caries throughout, no abscess or signs of infection over the area of pain ?Eyes:  ?   General: No scleral icterus. ?   Conjunctiva/sclera: Conjunctivae normal.  ?Pulmonary:  ?   Effort: Pulmonary effort is normal. No respiratory distress.  ?Skin: ?   Findings: No rash.  ?Neurological:  ?   Mental Status: He is alert.  ?   GCS: GCS eye subscore is 4. GCS verbal subscore is 5. GCS motor subscore is 6.  ?   Cranial Nerves:  No cranial nerve deficit or dysarthria.  ?   Motor: No weakness.  ?   Coordination: Coordination normal.  ?   Gait: Gait normal.  ?Psychiatric:     ?   Mood and Affect: Mood normal.  ? ? ?ED Results / Procedures / Treatments   ?Labs ?(all labs ordered are listed, but only abnormal results are displayed) ?Labs Reviewed  ?COMPREHENSIVE METABOLIC PANEL - Abnormal; Notable for the following components:  ?    Result Value  ? Glucose, Bld 128 (*)   ? Total Bilirubin 1.4 (*)   ? All other components within normal limits  ?CBC WITH DIFFERENTIAL/PLATELET - Abnormal; Notable for the following components:  ? WBC 10.6 (*)   ? RBC 7.43 (*)   ? Hemoglobin 19.0 (*)   ? HCT 60.1 (*)   ? MCH 25.6 (*)   ? RDW 19.9 (*)   ? All other components within normal limits  ?CBG MONITORING, ED - Abnormal; Notable for the following components:  ? Glucose-Capillary 149 (*)   ? All other components within normal limits  ?TROPONIN I (HIGH SENSITIVITY) - Abnormal; Notable for the following components:  ? Troponin I (High Sensitivity) 19 (*)   ? All other components within normal limits  ? ? ?EKG ?EKG Interpretation ? ?Date/Time:  Friday Jun 01 2021 08:59:17 EDT ?Ventricular Rate:  82 ?PR Interval:  223 ?QRS Duration: 113 ?QT Interval:  397 ?QTC Calculation: 464 ?R Axis:   -28 ?Text Interpretation: Sinus rhythm Multiple ventricular premature complexes Prolonged PR interval Probable left atrial enlargement Borderline IVCD with LAD Inferior infarct, old Anterior infarct, old similar to prior Confirmed by Wynona Dove ([id number]) on 06/01/2021 9:45:48 AM ? ?Radiology ?No results found. ? ?Procedures ?Procedures  ? ?Medications Ordered in ED ?Medications  ?ketorolac (TORADOL) 15 MG/ML injection 15 mg (has no administration in time range)  ?sodium chloride 0.9 % bolus 500 mL (500 mLs Intravenous New Bag/Given 06/01/21 0932)  ? ? ?ED Course/ Medical Decision Making/ A&P ?  ?                        ?Medical Decision Making ?Amount and/or Complexity of Data  Reviewed ?Labs: ordered. ? ?Risk ?Prescription drug management. ? ? ?This patient presents to the ED for concern of dizziness.  Differential includes but is not limited to BPPV, M?ni?re's disease, cerebellar stroke vertebral artery dissection, abnormal migraine, intoxication, hypo or hyperglycemia, inner ear infection, hypotension ?  ?This is not an exhaustive differential.  ?  ?Past Medical History / Co-morbidities / Social History: ?NSTEMI January 2023, recently diagnosed A-fib s/p cardioversion 4 days ago, type 2  diabetes, congestive heart failure and hypertension ?  ?Additional history: ?Additional history obtained from chart review.  He presented to the emergency department on 4/13 with dizziness and chest pain.  He was found to be in A-fib which was diagnosed in February.  He was admitted to the hospital at that time for lightheadedness and headache.  He had a full work-up to include negative head CT. ?  ?Physical Exam: ?Physical exam performed. The pertinent findings include: Frequent premature beats on auscultation and PVCs on monitor.  No neurologic deficits ? ?Lab Tests: ?I ordered, and personally interpreted labs.  The pertinent results include:  ?-Trop #1 19, second within normal limits ?-CBC with polycythemia, likely can [place] to hypoxia without CPAP machine and obesity.  Stable from last 3 months ?  ?Imaging Studies: ?CT head considered however this was done 2 weeks ago, do not feel as though it needs to be redone ?  ?Cardiac Monitoring:  ?The patient was maintained on a cardiac monitor.  My attending physician Dr. Pearline Cables viewed and interpreted the cardiac monitored which showed an underlying rhythm of: Normal sinus with multiple PVCs ?  ?Medications: ?I ordered medication including Toradol for headache. Reevaluation of the patient after these medicines showed that the patient improved. I have reviewed the patients home medicines and have made adjustments as needed. ?  ?Disposition: ?61 year old male  presenting with intermittent dizziness.  Has been seen for the same within the last couple of weeks and also had a cardioversion with his cardiologist on Monday.   ? ?Negative Tropx2 and stable EKG with PVCs, not in tr

## 2021-06-01 NOTE — ED Triage Notes (Signed)
Pt arrives  pov, steady gait, c/o intermittent dizziness and HA x 3 days. Also c/o left lower dental pain ?

## 2021-06-10 ENCOUNTER — Other Ambulatory Visit: Payer: Self-pay

## 2021-06-10 ENCOUNTER — Emergency Department (HOSPITAL_BASED_OUTPATIENT_CLINIC_OR_DEPARTMENT_OTHER)
Admission: EM | Admit: 2021-06-10 | Discharge: 2021-06-10 | Disposition: A | Discharge status: Home / Self Care | Payer: 59 | Attending: Emergency Medicine | Admitting: Emergency Medicine

## 2021-06-10 ENCOUNTER — Emergency Department (HOSPITAL_BASED_OUTPATIENT_CLINIC_OR_DEPARTMENT_OTHER): Payer: 59

## 2021-06-10 ENCOUNTER — Encounter (HOSPITAL_BASED_OUTPATIENT_CLINIC_OR_DEPARTMENT_OTHER): Payer: Self-pay | Admitting: Emergency Medicine

## 2021-06-10 DIAGNOSIS — I509 Heart failure, unspecified: Secondary | ICD-10-CM | POA: Diagnosis not present

## 2021-06-10 DIAGNOSIS — Z7984 Long term (current) use of oral hypoglycemic drugs: Secondary | ICD-10-CM | POA: Insufficient documentation

## 2021-06-10 DIAGNOSIS — Z79899 Other long term (current) drug therapy: Secondary | ICD-10-CM | POA: Diagnosis not present

## 2021-06-10 DIAGNOSIS — R519 Headache, unspecified: Secondary | ICD-10-CM | POA: Diagnosis present

## 2021-06-10 DIAGNOSIS — Z7901 Long term (current) use of anticoagulants: Secondary | ICD-10-CM | POA: Diagnosis not present

## 2021-06-10 DIAGNOSIS — E119 Type 2 diabetes mellitus without complications: Secondary | ICD-10-CM | POA: Diagnosis not present

## 2021-06-10 DIAGNOSIS — I110 Hypertensive heart disease with heart failure: Secondary | ICD-10-CM | POA: Diagnosis not present

## 2021-06-10 DIAGNOSIS — G43909 Migraine, unspecified, not intractable, without status migrainosus: Secondary | ICD-10-CM | POA: Diagnosis not present

## 2021-06-10 DIAGNOSIS — G43009 Migraine without aura, not intractable, without status migrainosus: Secondary | ICD-10-CM

## 2021-06-10 LAB — CBC WITH DIFFERENTIAL/PLATELET
Abs Immature Granulocytes: 0.01 10*3/uL (ref 0.00–0.07)
Basophils Absolute: 0.1 10*3/uL (ref 0.0–0.1)
Basophils Relative: 1 %
Eosinophils Absolute: 0.3 10*3/uL (ref 0.0–0.5)
Eosinophils Relative: 3 %
HCT: 59.8 % — ABNORMAL HIGH (ref 39.0–52.0)
Hemoglobin: 19 g/dL — ABNORMAL HIGH (ref 13.0–17.0)
Immature Granulocytes: 0 %
Lymphocytes Relative: 39 %
Lymphs Abs: 3.6 10*3/uL (ref 0.7–4.0)
MCH: 25.8 pg — ABNORMAL LOW (ref 26.0–34.0)
MCHC: 31.8 g/dL (ref 30.0–36.0)
MCV: 81.1 fL (ref 80.0–100.0)
Monocytes Absolute: 0.5 10*3/uL (ref 0.1–1.0)
Monocytes Relative: 6 %
Neutro Abs: 4.8 10*3/uL (ref 1.7–7.7)
Neutrophils Relative %: 51 %
Platelets: 332 10*3/uL (ref 150–400)
RBC: 7.37 MIL/uL — ABNORMAL HIGH (ref 4.22–5.81)
RDW: 20.1 % — ABNORMAL HIGH (ref 11.5–15.5)
WBC: 9.3 10*3/uL (ref 4.0–10.5)
nRBC: 0 % (ref 0.0–0.2)

## 2021-06-10 LAB — BASIC METABOLIC PANEL
Anion gap: 8 (ref 5–15)
BUN: 16 mg/dL (ref 6–20)
CO2: 24 mmol/L (ref 22–32)
Calcium: 8.7 mg/dL — ABNORMAL LOW (ref 8.9–10.3)
Chloride: 104 mmol/L (ref 98–111)
Creatinine, Ser: 1.15 mg/dL (ref 0.61–1.24)
GFR, Estimated: >60 mL/min (ref >60)
Glucose, Bld: 134 mg/dL — ABNORMAL HIGH (ref 70–99)
Potassium: 3.9 mmol/L (ref 3.5–5.1)
Sodium: 136 mmol/L (ref 135–145)

## 2021-06-10 MED ORDER — SODIUM CHLORIDE 0.9 % IV BOLUS
500.0000 mL | Freq: Once | INTRAVENOUS | Status: DC
Start: 2021-06-10 — End: 2021-06-10

## 2021-06-10 MED ORDER — PROCHLORPERAZINE EDISYLATE 10 MG/2ML IJ SOLN
10.0000 mg | Freq: Once | INTRAMUSCULAR | Status: AC
  Administered 2021-06-10: 10 mg via INTRAVENOUS
  Filled 2021-06-10: qty 2

## 2021-06-10 NOTE — ED Notes (Signed)
States has been having a HA for the past week, no relief at home, states has had some dizziness with this HA, walking around makes it worse, lying down in quiet, dark environment helps ?

## 2021-06-10 NOTE — ED Notes (Signed)
Denies having a HA at this time, requesting POs, resting quietly in room watching TV ?

## 2021-06-10 NOTE — ED Provider Notes (Signed)
?[facility] ?Provider Note ? ? ?CSN: [id number] ?Arrival date & time: 06/10/21  1012 ? ?  ? ?History ? ?Chief Complaint  ?Patient presents with  ? Headache  ? ? ?John Conner is a 61 y.o. male. ? ?Patient is here with headache.  History of hypertension, high cholesterol, heart failure.  Patient having intermittent headaches for the last 10 days.  Sometimes associated dizziness.  No dizziness today but mostly headache.  Patient with recent cardioversion.  History of A-fib on Eliquis.  Denies any weakness or numbness.  Denies any vision changes.  Denies any nausea or vomiting.  He states that headaches are atypical for him.  Nothing has made it worse or better.  Headache has improved while in the waiting room. ? ?The history is provided by the patient.  ? ?  ? ?Home Medications ?Prior to Admission medications   ?Medication Sig Start Date End Date Taking? Authorizing Provider  ?apixaban (ELIQUIS) 5 MG TABS tablet Take 5 mg by mouth 2 (two) times daily.    [provider]  ?atorvastatin (LIPITOR) 80 MG tablet Take 1 tablet (80 mg total) by mouth daily. ?Patient taking differently: Take 80 mg by mouth daily at 6 PM. 02/08/21   Consuelo Pandy, PA-C  ?carvedilol (COREG) 12.5 MG tablet Take 12.5 mg by mouth 2 (two) times daily with a meal.    [provider]  ?Cholecalciferol (VITAMIN D3) 10 MCG (400 UNIT) tablet Take 400 Units by mouth daily.    [provider]  ?empagliflozin (JARDIANCE) 10 MG TABS tablet Take 1 tablet (10 mg total) by mouth daily. 04/04/21   Clegg, Amy D, NP  ?fluticasone (FLONASE) 50 MCG/ACT nasal spray Place 1 spray into both nostrils as needed for allergies or rhinitis.    [provider]  ?furosemide (LASIX) 40 MG tablet Take 2 tablets (80 mg total) by mouth every morning AND 1 tablet (40 mg total) every evening. 04/06/21   Clegg, Amy D, NP  ?metFORMIN (GLUCOPHAGE) 500 MG tablet Take 500 mg by mouth 2 (two) times daily with a meal.     [provider]  ?potassium chloride SA (KLOR-CON M) 20 MEQ tablet Take 1 tablet (20 mEq total) by mouth daily. 02/08/21   Domenic Polite, MD  ?sacubitril-valsartan (ENTRESTO) 24-26 MG Take 1 tablet by mouth 2 (two) times daily. 04/12/21   Clegg, Amy D, NP  ?spironolactone (ALDACTONE) 25 MG tablet TAKE 1/2 TABLET BY MOUTH DAILY. 05/25/21   Bensimhon, Shaune Pascal, MD  ?   ? ?Allergies    ?Diphenhydramine hcl and Lisinopril   ? ?Review of Systems   ?Review of Systems ? ?Physical Exam ?Updated Vital Signs ?BP 114/75   Pulse 79   Temp 97.9 ?F (36.6 ?C) (Oral)   Resp 13   Ht '5\' 10"'$  (1.778 m)   Wt (!) 149.7 kg   SpO2 92%   BMI 47.35 kg/m?  ?Physical Exam ?Vitals and nursing note reviewed.  ?Constitutional:   ?   General: He is not in acute distress. ?   Appearance: He is well-developed. He is not ill-appearing.  ?HENT:  ?   Head: Normocephalic and atraumatic.  ?   Mouth/Throat:  ?   Mouth: Mucous membranes are moist.  ?Eyes:  ?   General: No visual field deficit. ?   Extraocular Movements: Extraocular movements intact.  ?   Conjunctiva/sclera: Conjunctivae normal.  ?   Pupils: Pupils are equal, round, and reactive to light.  ?Cardiovascular:  ?  Rate and Rhythm: Normal rate and regular rhythm.  ?   Heart sounds: Normal heart sounds. No murmur heard. ?Pulmonary:  ?   Effort: Pulmonary effort is normal. No respiratory distress.  ?   Breath sounds: Normal breath sounds.  ?Abdominal:  ?   Palpations: Abdomen is soft.  ?   Tenderness: There is no abdominal tenderness.  ?Musculoskeletal:     ?   General: No swelling.  ?   Cervical back: Normal range of motion and neck supple.  ?Skin: ?   General: Skin is warm and dry.  ?   Capillary Refill: Capillary refill takes less than 2 seconds.  ?Neurological:  ?   Mental Status: He is alert and oriented to person, place, and time.  ?   Cranial Nerves: No cranial nerve deficit, dysarthria or facial asymmetry.  ?   Sensory: No sensory deficit.  ?   Motor: No weakness.  ?    Coordination: Coordination normal.  ?Psychiatric:     ?   Mood and Affect: Mood normal.  ? ? ?ED Results / Procedures / Treatments   ?Labs ?(all labs ordered are listed, but only abnormal results are displayed) ?Labs Reviewed  ?CBC WITH DIFFERENTIAL/PLATELET - Abnormal; Notable for the following components:  ?    Result Value  ? RBC 7.37 (*)   ? Hemoglobin 19.0 (*)   ? HCT 59.8 (*)   ? MCH 25.8 (*)   ? RDW 20.1 (*)   ? All other components within normal limits  ?BASIC METABOLIC PANEL - Abnormal; Notable for the following components:  ? Glucose, Bld 134 (*)   ? Calcium 8.7 (*)   ? All other components within normal limits  ? ? ?EKG ?EKG Interpretation ? ?Date/Time:  Sunday Jun 10 2021 10:24:00 EDT ?Ventricular Rate:  78 ?PR Interval:  214 ?QRS Duration: 114 ?QT Interval:  408 ?QTC Calculation: 465 ?R Axis:   -69 ?Text Interpretation: Sinus rhythm with 1st degree A-V block with occasional and consecutive Premature ventricular complexes Left axis deviation When compared with ECG of 01-Jun-2021 08:59, PREVIOUS ECG IS PRESENT Confirmed by Lennice Sites ([id number]) on 06/10/2021 10:25:14 AM ? ?Radiology ?CT Head Wo Contrast ? ?Result Date: 06/10/2021 ?CLINICAL DATA:  Headache.  Intermittent dizziness. EXAM: CT HEAD WITHOUT CONTRAST TECHNIQUE: Contiguous axial images were obtained from the base of the skull through the vertex without intravenous contrast. RADIATION DOSE REDUCTION: This exam was performed according to the departmental dose-optimization program which includes automated exposure control, adjustment of the mA and/or kV according to patient size and/or use of iterative reconstruction technique. COMPARISON:  05/10/2021 FINDINGS: Brain: No evidence of acute infarction, hemorrhage, hydrocephalus, extra-axial collection or mass lesion/mass effect. Vascular: No hyperdense vessel or unexpected calcification. Skull: Normal. Negative for fracture or focal lesion. Sinuses/Orbits: Globes and orbits are unremarkable. Visualized  sinuses are clear. Other: None. IMPRESSION: Normal unenhanced CT scan of the brain. Electronically Signed   By: Lajean Manes M.D.   On: 06/10/2021 10:56   ? ?Procedures ?Procedures  ? ? ?Medications Ordered in ED ?Medications  ?prochlorperazine (COMPAZINE) injection 10 mg (10 mg Intravenous Given 06/10/21 1137)  ? ? ?ED Course/ Medical Decision Making/ A&P ?  ?                        ?Medical Decision Making ?Amount and/or Complexity of Data Reviewed ?Labs: ordered. ?Radiology: ordered. ? ?Risk ?Prescription drug management. ? ? ?John Conner is here with headache.  Normal  vitals.  No fever.  Intermittent headache and sometimes dizziness last 10 days.  Denies any weakness or numbness or vision changes currently.  Denies any shortness of breath or chest pain.  Headache fairly mild but worse this morning.  On blood thinners.  Has a history of hypertension, high cholesterol, diabetes, atrial fibrillation.  EKG shows sinus rhythm.  No ischemic changes.  Overall minimally symptomatic with normal neurological exam.  Atypical headaches and will get head CT to rule out head mass or head bleed.  We will check basic labs.  Will give headache medicine with Compazine.  Overall my suspicion is that this is migraine or vertigo.  I have no suspicion for stroke at this time. ? ?Per my review of labs my interpretation is there is no significant findings.  No significant leukocytosis or anemia.  CT scan of the head is unremarkable.  Headache gone after Compazine.  Recommend follow-up primary care doctor. ? ?This chart was dictated using voice recognition software.  Despite best efforts to proofread,  errors can occur which can change the documentation meaning.  ? ? ? ? ? ? ? ?Final Clinical Impression(s) / ED Diagnoses ?Final diagnoses:  ?Migraine without aura and without status migrainosus, not intractable  ? ? ?Rx / DC Orders ?ED Discharge Orders   ? ? None  ? ?  ? ? ?  ?Lennice Sites, DO ?06/10/21 1222 ? ?

## 2021-06-10 NOTE — ED Triage Notes (Signed)
Pt arrives  pov, steady gait, c/o intermittent dizziness and HA x 10 days. Seen recently for same. Pt ao x 4. Denies blurred vision at this time, was blurred when waking. VAN neg ?

## 2021-06-10 NOTE — ED Notes (Signed)
Client medicated per ED MD orders, lights dimmed, encouraged client to not use mobile device or watching TV, call bell within reach ?

## 2021-06-11 ENCOUNTER — Encounter (HOSPITAL_COMMUNITY): Payer: Self-pay

## 2021-06-11 ENCOUNTER — Ambulatory Visit (HOSPITAL_COMMUNITY)
Admission: RE | Admit: 2021-06-11 | Discharge: 2021-06-11 | Disposition: A | Discharge status: Home / Self Care | Payer: 59 | Source: Ambulatory Visit | Attending: Family Medicine | Admitting: Family Medicine

## 2021-06-11 ENCOUNTER — Telehealth (HOSPITAL_COMMUNITY): Payer: Self-pay | Admitting: *Deleted

## 2021-06-11 VITALS — BP 120/82 | HR 79 | Ht 70.0 in | Wt 330.0 lb

## 2021-06-11 DIAGNOSIS — I110 Hypertensive heart disease with heart failure: Secondary | ICD-10-CM | POA: Insufficient documentation

## 2021-06-11 DIAGNOSIS — Z7901 Long term (current) use of anticoagulants: Secondary | ICD-10-CM | POA: Diagnosis not present

## 2021-06-11 DIAGNOSIS — I5022 Chronic systolic (congestive) heart failure: Secondary | ICD-10-CM | POA: Diagnosis present

## 2021-06-11 DIAGNOSIS — Z79899 Other long term (current) drug therapy: Secondary | ICD-10-CM | POA: Insufficient documentation

## 2021-06-11 DIAGNOSIS — R42 Dizziness and giddiness: Secondary | ICD-10-CM | POA: Insufficient documentation

## 2021-06-11 DIAGNOSIS — E119 Type 2 diabetes mellitus without complications: Secondary | ICD-10-CM

## 2021-06-11 DIAGNOSIS — Z7984 Long term (current) use of oral hypoglycemic drugs: Secondary | ICD-10-CM | POA: Diagnosis not present

## 2021-06-11 DIAGNOSIS — I428 Other cardiomyopathies: Secondary | ICD-10-CM | POA: Diagnosis not present

## 2021-06-11 DIAGNOSIS — Z6841 Body Mass Index (BMI) 40.0 and over, adult: Secondary | ICD-10-CM | POA: Insufficient documentation

## 2021-06-11 DIAGNOSIS — I10 Essential (primary) hypertension: Secondary | ICD-10-CM | POA: Diagnosis not present

## 2021-06-11 DIAGNOSIS — E785 Hyperlipidemia, unspecified: Secondary | ICD-10-CM

## 2021-06-11 DIAGNOSIS — I4891 Unspecified atrial fibrillation: Secondary | ICD-10-CM

## 2021-06-11 DIAGNOSIS — R519 Headache, unspecified: Secondary | ICD-10-CM

## 2021-06-11 DIAGNOSIS — G4733 Obstructive sleep apnea (adult) (pediatric): Secondary | ICD-10-CM | POA: Diagnosis not present
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
## 2021-06-11 NOTE — Patient Instructions (Signed)
It was great to see you today! ?No medication changes are needed at this time. ? ? ? ?Keep cardiology follow up as scheduled ? ? ?Do the following things EVERYDAY: ?Weigh yourself in the morning before breakfast. Write it down and keep it in a log. ?Take your medicines as prescribed ?Eat low salt foods--Limit salt (sodium) to 2000 mg per day.  ?Stay as active as you can everyday ?Limit all fluids for the day to less than 2 liters ? ?At the [facility], you and your health needs are our priority. As part of our continuing mission to provide you with exceptional heart care, we have created designated Provider Care Teams. These Care Teams include your primary Cardiologist (physician) and Advanced Practice Providers (APPs- Physician Assistants and Nurse Practitioners) who all work together to provide you with the care you need, when you need it.  ? ?You may see any of the following providers on your designated Care Team at your next follow up: ?Dr Glori Bickers ?Dr Loralie Champagne ?Darrick Grinder, NP ?Lyda Jester, PA ?Jessica Milford,NP ?Marlyce Huge, PA ?Audry Riles, PharmD ? ? ?Please be sure to bring in all your medications bottles to every appointment.  ? ?If you have any questions or concerns before your next appointment please send [place] a message through [place] or call our office at [phone].   ? ?TO LEAVE A MESSAGE FOR THE NURSE SELECT OPTION 2, PLEASE LEAVE A MESSAGE INCLUDING: ?YOUR NAME ?DATE OF BIRTH ?CALL BACK NUMBER ?REASON FOR CALL**this is important as we prioritize the call backs ? ?YOU WILL RECEIVE A CALL BACK THE SAME DAY AS LONG AS YOU CALL BEFORE 4:00 PM ? ?

## 2021-06-27 ENCOUNTER — Other Ambulatory Visit (HOSPITAL_COMMUNITY): Payer: Self-pay

## 2021-07-24 ENCOUNTER — Encounter (HOSPITAL_BASED_OUTPATIENT_CLINIC_OR_DEPARTMENT_OTHER): Payer: Self-pay

## 2021-07-24 ENCOUNTER — Emergency Department (HOSPITAL_BASED_OUTPATIENT_CLINIC_OR_DEPARTMENT_OTHER)
Admission: EM | Admit: 2021-07-24 | Discharge: 2021-07-24 | Disposition: A | Discharge status: Home / Self Care | Payer: 59 | Attending: Emergency Medicine | Admitting: Emergency Medicine

## 2021-07-24 ENCOUNTER — Emergency Department (HOSPITAL_BASED_OUTPATIENT_CLINIC_OR_DEPARTMENT_OTHER): Payer: 59

## 2021-07-24 DIAGNOSIS — Z7984 Long term (current) use of oral hypoglycemic drugs: Secondary | ICD-10-CM | POA: Diagnosis not present

## 2021-07-24 DIAGNOSIS — R519 Headache, unspecified: Secondary | ICD-10-CM

## 2021-07-24 DIAGNOSIS — I5022 Chronic systolic (congestive) heart failure: Secondary | ICD-10-CM | POA: Insufficient documentation

## 2021-07-24 DIAGNOSIS — I4891 Unspecified atrial fibrillation: Secondary | ICD-10-CM | POA: Diagnosis not present

## 2021-07-24 DIAGNOSIS — E119 Type 2 diabetes mellitus without complications: Secondary | ICD-10-CM | POA: Diagnosis not present

## 2021-07-24 DIAGNOSIS — Z7901 Long term (current) use of anticoagulants: Secondary | ICD-10-CM | POA: Insufficient documentation

## 2021-07-24 DIAGNOSIS — I110 Hypertensive heart disease with heart failure: Secondary | ICD-10-CM | POA: Insufficient documentation

## 2021-07-24 LAB — CBC
HCT: 60.5 % — ABNORMAL HIGH (ref 39.0–52.0)
Hemoglobin: 19.1 g/dL — ABNORMAL HIGH (ref 13.0–17.0)
MCH: 26.2 pg (ref 26.0–34.0)
MCHC: 31.6 g/dL (ref 30.0–36.0)
MCV: 83.1 fL (ref 80.0–100.0)
Platelets: 321 10*3/uL (ref 150–400)
RBC: 7.28 MIL/uL — ABNORMAL HIGH (ref 4.22–5.81)
RDW: 53.5 % — ABNORMAL HIGH (ref 11.5–15.5)
WBC: 10.6 10*3/uL — ABNORMAL HIGH (ref 4.0–10.5)
nRBC: 0 % (ref 0.0–0.2)

## 2021-07-24 LAB — CBG MONITORING, ED: Glucose-Capillary: 113 mg/dL — ABNORMAL HIGH (ref 70–99)

## 2021-07-24 LAB — BASIC METABOLIC PANEL
Anion gap: 8 (ref 5–15)
BUN: 15 mg/dL (ref 6–20)
CO2: 25 mmol/L (ref 22–32)
Calcium: 8.8 mg/dL — ABNORMAL LOW (ref 8.9–10.3)
Chloride: 105 mmol/L (ref 98–111)
Creatinine, Ser: 1.2 mg/dL (ref 0.61–1.24)
GFR, Estimated: >60 mL/min (ref >60)
Glucose, Bld: 115 mg/dL — ABNORMAL HIGH (ref 70–99)
Potassium: 3.8 mmol/L (ref 3.5–5.1)
Sodium: 138 mmol/L (ref 135–145)

## 2021-07-24 LAB — TROPONIN I (HIGH SENSITIVITY): Troponin I (High Sensitivity): 21 ng/L — ABNORMAL HIGH (ref <18)

## 2021-07-24 MED ORDER — METOCLOPRAMIDE HCL 5 MG/ML IJ SOLN
10.0000 mg | Freq: Once | INTRAMUSCULAR | Status: AC
  Administered 2021-07-24: 10 mg via INTRAVENOUS
  Filled 2021-07-24: qty 2

## 2021-07-24 MED ORDER — METOCLOPRAMIDE HCL 5 MG/ML IJ SOLN: 5.0000 mg | Freq: Once | INTRAMUSCULAR | Status: DC

## 2021-07-24 NOTE — ED Notes (Signed)
Discharge instructions and recommendations reviewed with patient. Pt states understanding. Pt ambulatory and discharged home.

## 2021-07-24 NOTE — ED Triage Notes (Addendum)
Pt reports HA and left side pain for 2 days. Dropped wife off and ED and when he got home today he felt dizzy  Denies CP/SOB. No nausea vomiting

## 2021-07-30 ENCOUNTER — Telehealth (HOSPITAL_COMMUNITY): Payer: Self-pay | Admitting: *Deleted

## 2021-07-30 NOTE — Telephone Encounter (Signed)
Reaching out to patient to offer assistance regarding upcoming cardiac imaging study; pt verbalizes understanding of appt date/time, parking situation and where to check in, and verified current allergies; name and call back number provided for further questions should they arise  Gordy Clement RN [place] [facility] [phone] office [phone] cell  Patient denies metal or claustrophobia.

## 2021-08-01 ENCOUNTER — Ambulatory Visit (HOSPITAL_COMMUNITY)
Admission: RE | Admit: 2021-08-01 | Discharge: 2021-08-01 | Disposition: A | Discharge status: Home / Self Care | Payer: No Typology Code available for payment source | Source: Ambulatory Visit | Attending: Family Medicine | Admitting: Family Medicine

## 2021-08-01 DIAGNOSIS — I5022 Chronic systolic (congestive) heart failure: Secondary | ICD-10-CM | POA: Insufficient documentation

## 2021-08-01 MED ORDER — GADOBUTROL 1 MMOL/ML IV SOLN
10.0000 mL | Freq: Once | INTRAVENOUS | Status: AC | PRN
  Administered 2021-08-01: 10 mL via INTRAVENOUS

## 2021-08-15 ENCOUNTER — Other Ambulatory Visit (HOSPITAL_BASED_OUTPATIENT_CLINIC_OR_DEPARTMENT_OTHER): Payer: Self-pay

## 2021-08-15 ENCOUNTER — Encounter (HOSPITAL_COMMUNITY): Payer: Self-pay | Admitting: Internal Medicine

## 2021-08-15 ENCOUNTER — Ambulatory Visit (HOSPITAL_BASED_OUTPATIENT_CLINIC_OR_DEPARTMENT_OTHER)
Admission: RE | Admit: 2021-08-15 | Discharge: 2021-08-15 | Disposition: A | Discharge status: Home / Self Care | Payer: 59 | Source: Ambulatory Visit | Attending: Internal Medicine | Admitting: Internal Medicine

## 2021-08-15 ENCOUNTER — Ambulatory Visit (HOSPITAL_COMMUNITY)
Admission: RE | Admit: 2021-08-15 | Discharge: 2021-08-15 | Disposition: A | Discharge status: Home / Self Care | Payer: 59 | Source: Ambulatory Visit | Attending: Internal Medicine | Admitting: Internal Medicine

## 2021-08-15 ENCOUNTER — Telehealth (HOSPITAL_COMMUNITY): Payer: Self-pay | Admitting: Pharmacy Technician

## 2021-08-15 VITALS — BP 134/80 | HR 64 | Wt 338.0 lb

## 2021-08-15 DIAGNOSIS — I340 Nonrheumatic mitral (valve) insufficiency: Secondary | ICD-10-CM | POA: Insufficient documentation

## 2021-08-15 DIAGNOSIS — I5022 Chronic systolic (congestive) heart failure: Secondary | ICD-10-CM

## 2021-08-15 DIAGNOSIS — G4733 Obstructive sleep apnea (adult) (pediatric): Secondary | ICD-10-CM | POA: Diagnosis not present

## 2021-08-15 DIAGNOSIS — Z7901 Long term (current) use of anticoagulants: Secondary | ICD-10-CM | POA: Diagnosis not present

## 2021-08-15 DIAGNOSIS — I480 Paroxysmal atrial fibrillation: Secondary | ICD-10-CM | POA: Diagnosis not present

## 2021-08-15 DIAGNOSIS — M25561 Pain in right knee: Secondary | ICD-10-CM | POA: Insufficient documentation

## 2021-08-15 DIAGNOSIS — E785 Hyperlipidemia, unspecified: Secondary | ICD-10-CM | POA: Diagnosis not present

## 2021-08-15 DIAGNOSIS — Z6841 Body Mass Index (BMI) 40.0 and over, adult: Secondary | ICD-10-CM | POA: Insufficient documentation

## 2021-08-15 DIAGNOSIS — M549 Dorsalgia, unspecified: Secondary | ICD-10-CM | POA: Diagnosis not present

## 2021-08-15 DIAGNOSIS — M25562 Pain in left knee: Secondary | ICD-10-CM | POA: Insufficient documentation

## 2021-08-15 DIAGNOSIS — I110 Hypertensive heart disease with heart failure: Secondary | ICD-10-CM | POA: Insufficient documentation

## 2021-08-15 DIAGNOSIS — Z7984 Long term (current) use of oral hypoglycemic drugs: Secondary | ICD-10-CM | POA: Insufficient documentation

## 2021-08-15 DIAGNOSIS — E119 Type 2 diabetes mellitus without complications: Secondary | ICD-10-CM | POA: Diagnosis not present

## 2021-08-15 DIAGNOSIS — Z79899 Other long term (current) drug therapy: Secondary | ICD-10-CM | POA: Insufficient documentation

## 2021-08-15 DIAGNOSIS — I4891 Unspecified atrial fibrillation: Secondary | ICD-10-CM | POA: Insufficient documentation

## 2021-08-15 DIAGNOSIS — I428 Other cardiomyopathies: Secondary | ICD-10-CM | POA: Diagnosis not present

## 2021-08-15 LAB — ECHOCARDIOGRAM COMPLETE
Area-P 1/2: 3.48 cm2
Calc EF: 35.5 %
MV M vel: 5 m/s
MV Peak grad: 100 mmHg
Radius: 0.5 cm
S' Lateral: 5.7 cm
Single Plane A2C EF: 38.3 %
Single Plane A4C EF: 34.1 %

## 2021-08-15 MED ORDER — ENTRESTO 49-51 MG PO TABS
1.0000 | ORAL_TABLET | Freq: Two times a day (BID) | ORAL | 11 refills | Status: DC
  Filled 2021-08-15: qty 60, 30d supply, fill #0
  Filled 2021-09-11: qty 60, 30d supply, fill #1
  Filled 2021-10-12: qty 60, 30d supply, fill #2
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
## 2021-08-15 NOTE — Progress Notes (Signed)
error 

## 2021-08-15 NOTE — Telephone Encounter (Signed)
Patient Advocate Encounter   Received notification from [place] that prior authorization for [name] is required.   PA submitted on CoverMyMeds Key [id number] Status is pending   Will continue to follow.

## 2021-08-15 NOTE — Patient Instructions (Signed)
Increase Entresto to 49/51 Twice daily  You have been referred to Cardiac Electrophysiology office. They will call you to arrange the appointment.  Your physician recommends that you schedule a follow-up appointment in: 6 months ( January 2024)  ** please call the office in October to arrange your follow up appointment **  If you have any questions or concerns before your next appointment please send [place] a message through [place] or call our office at [phone].    TO LEAVE A MESSAGE FOR THE NURSE SELECT OPTION 2, PLEASE LEAVE A MESSAGE INCLUDING: YOUR NAME DATE OF BIRTH CALL BACK NUMBER REASON FOR CALL**this is important as we prioritize the call backs  YOU WILL RECEIVE A CALL BACK THE SAME DAY AS LONG AS YOU CALL BEFORE 4:00 PM  At the [facility], you and your health needs are our priority. As part of our continuing mission to provide you with exceptional heart care, we have created designated Provider Care Teams. These Care Teams include your primary Cardiologist (physician) and Advanced Practice Providers (APPs- Physician Assistants and Nurse Practitioners) who all work together to provide you with the care you need, when you need it.   You may see any of the following providers on your designated Care Team at your next follow up: Dr Glori Bickers Dr Haynes Kerns, NP Lyda Jester, [place] [facility] [place], [place] Audry Riles, PharmD   Please be sure to bring in all your medications bottles to every appointment.

## 2021-08-16 ENCOUNTER — Other Ambulatory Visit (HOSPITAL_BASED_OUTPATIENT_CLINIC_OR_DEPARTMENT_OTHER): Payer: Self-pay

## 2021-08-17 ENCOUNTER — Other Ambulatory Visit (HOSPITAL_COMMUNITY): Payer: Self-pay

## 2021-08-17 NOTE — Telephone Encounter (Signed)
Advanced Heart Failure Patient Advocate Encounter  Prior Authorization for [name] has been approved.    PA# [id number] Effective dates: 08/01/21 through 08/15/22  Charlann Boxer, CPhT

## 2021-09-10 ENCOUNTER — Institutional Professional Consult (permissible substitution): Payer: Medicaid Other | Admitting: Internal Medicine

## 2021-09-11 ENCOUNTER — Other Ambulatory Visit (HOSPITAL_BASED_OUTPATIENT_CLINIC_OR_DEPARTMENT_OTHER): Payer: Self-pay

## 2021-09-24 ENCOUNTER — Other Ambulatory Visit (HOSPITAL_COMMUNITY): Payer: Self-pay | Admitting: *Deleted

## 2021-09-24 MED ORDER — APIXABAN 5 MG PO TABS: 5.0000 mg | ORAL_TABLET | Freq: Two times a day (BID) | ORAL | 6 refills | Status: DC

## 2021-09-29 ENCOUNTER — Other Ambulatory Visit: Payer: Self-pay

## 2021-09-29 ENCOUNTER — Emergency Department (HOSPITAL_BASED_OUTPATIENT_CLINIC_OR_DEPARTMENT_OTHER): Payer: No Typology Code available for payment source

## 2021-09-29 ENCOUNTER — Encounter (HOSPITAL_BASED_OUTPATIENT_CLINIC_OR_DEPARTMENT_OTHER): Payer: Self-pay | Admitting: Emergency Medicine

## 2021-09-29 ENCOUNTER — Emergency Department (HOSPITAL_BASED_OUTPATIENT_CLINIC_OR_DEPARTMENT_OTHER)
Admission: EM | Admit: 2021-09-29 | Discharge: 2021-09-29 | Disposition: A | Discharge status: Home / Self Care | Payer: No Typology Code available for payment source | Attending: Emergency Medicine | Admitting: Emergency Medicine

## 2021-09-29 DIAGNOSIS — E119 Type 2 diabetes mellitus without complications: Secondary | ICD-10-CM | POA: Insufficient documentation

## 2021-09-29 DIAGNOSIS — Z7984 Long term (current) use of oral hypoglycemic drugs: Secondary | ICD-10-CM | POA: Diagnosis not present

## 2021-09-29 DIAGNOSIS — R519 Headache, unspecified: Secondary | ICD-10-CM | POA: Diagnosis not present

## 2021-09-29 DIAGNOSIS — I5020 Unspecified systolic (congestive) heart failure: Secondary | ICD-10-CM | POA: Diagnosis not present

## 2021-09-29 DIAGNOSIS — R0789 Other chest pain: Secondary | ICD-10-CM | POA: Insufficient documentation

## 2021-09-29 DIAGNOSIS — Z7901 Long term (current) use of anticoagulants: Secondary | ICD-10-CM | POA: Insufficient documentation

## 2021-09-29 LAB — BASIC METABOLIC PANEL
Anion gap: 10 (ref 5–15)
BUN: 12 mg/dL (ref 8–23)
CO2: 23 mmol/L (ref 22–32)
Calcium: 8.5 mg/dL — ABNORMAL LOW (ref 8.9–10.3)
Chloride: 105 mmol/L (ref 98–111)
Creatinine, Ser: 1.26 mg/dL — ABNORMAL HIGH (ref 0.61–1.24)
GFR, Estimated: >60 mL/min (ref >60)
Glucose, Bld: 104 mg/dL — ABNORMAL HIGH (ref 70–99)
Potassium: 3.3 mmol/L — ABNORMAL LOW (ref 3.5–5.1)
Sodium: 138 mmol/L (ref 135–145)

## 2021-09-29 LAB — CBC
HCT: 61.8 % — ABNORMAL HIGH (ref 39.0–52.0)
Hemoglobin: 19.8 g/dL — ABNORMAL HIGH (ref 13.0–17.0)
MCH: 26.3 pg (ref 26.0–34.0)
MCHC: 32 g/dL (ref 30.0–36.0)
MCV: 82.2 fL (ref 80.0–100.0)
Platelets: 338 10*3/uL (ref 150–400)
RBC: 7.52 MIL/uL — ABNORMAL HIGH (ref 4.22–5.81)
RDW: 17.8 % — ABNORMAL HIGH (ref 11.5–15.5)
WBC: 12.7 10*3/uL — ABNORMAL HIGH (ref 4.0–10.5)
nRBC: 0 % (ref 0.0–0.2)

## 2021-09-29 LAB — TROPONIN I (HIGH SENSITIVITY): Troponin I (High Sensitivity): 17 ng/L (ref <18)

## 2021-09-29 MED ORDER — SODIUM CHLORIDE 0.9 % IV BOLUS
250.0000 mL | Freq: Once | INTRAVENOUS | Status: AC
  Administered 2021-09-29: 250 mL via INTRAVENOUS

## 2021-09-29 MED ORDER — SODIUM CHLORIDE 0.9 % IV BOLUS
500.0000 mL | Freq: Once | INTRAVENOUS | Status: AC
Start: 2021-09-29 — End: 2021-09-29
  Administered 2021-09-29: 500 mL via INTRAVENOUS

## 2021-09-29 NOTE — ED Provider Notes (Signed)
[facility] EMERGENCY DEPARTMENT Provider Note   CSN: [id number] Arrival date & time: 09/29/21  0041     History  Chief Complaint  Patient presents with   Chest Pain   Headache    John Conner is a 61 y.o. male.  Patient is a 61 year old male with past medical history of cardiomyopathy with reduced EF, congestive heart failure, obstructive sleep apnea, type 2 diabetes.  Patient presenting today for evaluation of chest discomfort.  This started 2 evenings ago in the absence of any injury or trauma.  This discomfort has been coming and going with no aggravating or alleviating factors.  He denies shortness of breath, nausea, diaphoresis, or radiation of the pain.  He denies any fevers, chills, or cough.    He also reports headache intermittently for the past 2 days.  He denies any blurry vision, weakness, or numbness.  He denies any injury or trauma.  He does take Eliquis.  He had a heart cath in January 2023 showing no obstructive lesions.  The history is provided by the patient.  Headache      Home Medications Prior to Admission medications   Medication Sig Start Date End Date Taking? Authorizing Provider  apixaban (ELIQUIS) 5 MG TABS tablet Take 1 tablet (5 mg total) by mouth 2 (two) times daily. 09/24/21   Bensimhon, Shaune Pascal, MD  atorvastatin (LIPITOR) 80 MG tablet Take 1 tablet (80 mg total) by mouth daily. 02/08/21   Lyda Jester M, PA-C  carvedilol (COREG) 12.5 MG tablet Take 12.5 mg by mouth 2 (two) times daily with a meal.    [provider]  Cholecalciferol (VITAMIN D3) 10 MCG (400 UNIT) tablet Take 400 Units by mouth daily.    [provider]  empagliflozin (JARDIANCE) 10 MG TABS tablet Take 1 tablet (10 mg total) by mouth daily. 04/04/21   Clegg, Amy D, NP  fluticasone (FLONASE) 50 MCG/ACT nasal spray Place 1 spray into both nostrils as needed for allergies or rhinitis.    [provider]  furosemide (LASIX) 40 MG tablet Take 2  tablets (80 mg total) by mouth every morning AND 1 tablet (40 mg total) every evening. 04/06/21   Clegg, Amy D, NP  metFORMIN (GLUCOPHAGE) 500 MG tablet Take 500 mg by mouth 2 (two) times daily with a meal.    [provider]  potassium chloride SA (KLOR-CON M) 20 MEQ tablet Take 1 tablet (20 mEq total) by mouth daily. 02/08/21   Domenic Polite, MD  sacubitril-valsartan (ENTRESTO) 49-51 MG Take 1 tablet by mouth 2 (two) times daily. 08/15/21   Bensimhon, Shaune Pascal, MD  spironolactone (ALDACTONE) 25 MG tablet TAKE 1/2 TABLET BY MOUTH DAILY. 05/25/21   Bensimhon, Shaune Pascal, MD      Allergies    Diphenhydramine hcl and Lisinopril    Review of Systems   Review of Systems  All other systems reviewed and are negative.   Physical Exam Updated Vital Signs BP 92/70   Pulse 81   Temp 97.8 F (36.6 C) (Oral)   Resp 14   Ht '5\' 10"'$  (1.778 m)   Wt (S) (!) 150.1 kg   SpO2 93%   BMI 47.49 kg/m  Physical Exam Vitals and nursing note reviewed.  Constitutional:      General: He is not in acute distress.    Appearance: He is well-developed. He is not diaphoretic.  HENT:     Head: Normocephalic and atraumatic.  Cardiovascular:     Rate and  Rhythm: Regular rhythm.     Heart sounds: No murmur heard.    No friction rub.  Pulmonary:     Effort: Pulmonary effort is normal. No respiratory distress.     Breath sounds: Normal breath sounds. No wheezing or rales.  Abdominal:     General: Bowel sounds are normal. There is no distension.     Palpations: Abdomen is soft.     Tenderness: There is no abdominal tenderness.  Musculoskeletal:        General: Normal range of motion.     Cervical back: Normal range of motion and neck supple.  Skin:    General: Skin is warm and dry.  Neurological:     General: No focal deficit present.     Mental Status: He is alert and oriented to person, place, and time.     Cranial Nerves: No cranial nerve deficit.     Motor: No weakness.     Coordination:  Coordination normal.     ED Results / Procedures / Treatments   Labs (all labs ordered are listed, but only abnormal results are displayed) Labs Reviewed  BASIC METABOLIC PANEL - Abnormal; Notable for the following components:      Result Value   Potassium 3.3 (*)    Glucose, Bld 104 (*)    Creatinine, Ser 1.26 (*)    Calcium 8.5 (*)    All other components within normal limits  CBC - Abnormal; Notable for the following components:   WBC 12.7 (*)    RBC 7.52 (*)    Hemoglobin 19.8 (*)    HCT 61.8 (*)    RDW 17.8 (*)    All other components within normal limits  TROPONIN I (HIGH SENSITIVITY)    EKG EKG Interpretation  Date/Time:  Saturday September 29 2021 00:49:17 EDT Ventricular Rate:  83 PR Interval:    QRS Duration: 125 QT Interval:  394 QTC Calculation: 463 R Axis:   -72 Text Interpretation: Atrial fibrillation Ventricular premature complex Nonspecific IVCD with LAD Inferior infarct, old Abnormal lateral Q waves Anterior infarct, old No significant change since 08/15/2021 Confirmed by Veryl Speak [phone]) on 09/29/2021 1:23:08 AM  Radiology DG Chest 2 View  Result Date: 09/29/2021 CLINICAL DATA:  Left-sided chest pain and headache.  History of CHF. EXAM: CHEST - 2 VIEW COMPARISON:  07/24/2021. FINDINGS: The heart size and mediastinal contours are stable. Pulmonary vasculature is mildly distended. No consolidation, effusion, or pneumothorax. No acute osseous abnormality. IMPRESSION: Cardiomegaly with mild pulmonary vascular congestion. Electronically Signed   By: Brett Fairy M.D.   On: 09/29/2021 01:27    Procedures Procedures    Medications Ordered in ED Medications - No data to display  ED Course/ Medical Decision Making/ A&P  This patient presents to the ED for concern of chest discomfort, this involves an extensive number of treatment options, and is a complaint that carries with it a high risk of complications and morbidity.  The differential diagnosis  includes ACS, pulmonary embolism, aortic dissection   Co morbidities that complicate the patient evaluation  Congestive heart failure   Additional history obtained:  No additional history or external records needed   Lab Tests:  I Ordered, and personally interpreted labs.  The pertinent results include: Unremarkable CBC, metabolic panel, and troponin   Imaging Studies ordered:  I ordered imaging studies including chest x-ray and head CT I independently visualized and interpreted imaging which showed no acute process I agree with the radiologist interpretation  Cardiac Monitoring: / EKG:  The patient was maintained on a cardiac monitor.  I personally viewed and interpreted the cardiac monitored which showed an underlying rhythm of: Sinus rhythm   Consultations Obtained:  No urgent consultations indicated   Problem List / ED Course / Critical interventions / Medication management  Patient presenting today with complaints of chest discomfort that has been ongoing for greater than 24 hours.  His EKG is unchanged and troponin is negative.  His symptoms are atypical for cardiac pain and I doubt ACS.  He had a heart cath in January 2023 according to clinic notes, but no blockages were reported. Also considered is pulmonary embolism, however patient is already taking Eliquis and has been compliant with this medication.  I feel that this possibility is extremely unlikely. I have also considered aortic dissection and pneumothorax, both of which I doubt. Patient did complain of headache so a head CT was obtained showing no acute process. I have reviewed the patients home medicines and have made adjustments as needed   Social Determinants of Health:  None   Test / Admission - Considered:  No emergent pathology identified that would necessitate admission.  I feel as though patient can safely be discharged with outpatient follow-up.  Final Clinical Impression(s) / ED  Diagnoses Final diagnoses:  None    Rx / DC Orders ED Discharge Orders     None         Veryl Speak, MD 09/29/21 [phone]

## 2021-09-29 NOTE — Discharge Instructions (Signed)
Follow-up with your cardiologist next week, and return to the ER if you develop severe chest pain, difficulty breathing, or other new and concerning symptoms.

## 2021-09-29 NOTE — ED Triage Notes (Signed)
Pt c/o left sided chest pain and headache since yesterday. Describes pain as intermittent pressure. Started while patient was at rest. Also endorses mild dizziness. Denies radiation, shob, n/v.

## 2021-09-29 NOTE — ED Notes (Signed)
Patient transported to CT 

## 2021-10-02 ENCOUNTER — Other Ambulatory Visit (HOSPITAL_COMMUNITY): Payer: Self-pay

## 2021-10-04 ENCOUNTER — Telehealth (HOSPITAL_COMMUNITY): Payer: Self-pay

## 2021-10-04 NOTE — Telephone Encounter (Signed)
Received Surgical Clearance form from [facility] form signed by Dr. Haroldine Laws and faxed successfully to [phone] on Thursday, September 7,. Form will be scanned into patients chart.

## 2021-10-07 ENCOUNTER — Other Ambulatory Visit (HOSPITAL_COMMUNITY): Payer: Self-pay | Admitting: Internal Medicine

## 2021-10-12 ENCOUNTER — Other Ambulatory Visit (HOSPITAL_BASED_OUTPATIENT_CLINIC_OR_DEPARTMENT_OTHER): Payer: Self-pay

## 2021-10-18 ENCOUNTER — Other Ambulatory Visit (HOSPITAL_COMMUNITY): Payer: Self-pay

## 2021-10-23 ENCOUNTER — Other Ambulatory Visit (HOSPITAL_COMMUNITY): Payer: Self-pay | Admitting: *Deleted

## 2021-10-23 MED ORDER — CARVEDILOL 12.5 MG PO TABS: 12.5000 mg | ORAL_TABLET | Freq: Two times a day (BID) | ORAL | 6 refills | Status: DC

## 2021-10-23 MED ORDER — ATORVASTATIN CALCIUM 80 MG PO TABS: 80.0000 mg | ORAL_TABLET | Freq: Every day | ORAL | 6 refills | Status: DC

## 2021-10-25 DIAGNOSIS — I4891 Unspecified atrial fibrillation: Secondary | ICD-10-CM | POA: Insufficient documentation

## 2021-10-25 DIAGNOSIS — I5022 Chronic systolic (congestive) heart failure: Secondary | ICD-10-CM | POA: Insufficient documentation

## 2021-10-26 ENCOUNTER — Encounter: Payer: Self-pay | Admitting: Internal Medicine

## 2021-10-26 ENCOUNTER — Ambulatory Visit (INDEPENDENT_AMBULATORY_CARE_PROVIDER_SITE_OTHER): Payer: No Typology Code available for payment source

## 2021-10-26 ENCOUNTER — Ambulatory Visit: Payer: No Typology Code available for payment source | Attending: Internal Medicine | Admitting: Internal Medicine

## 2021-10-26 VITALS — BP 114/72 | HR 108 | Ht 70.0 in | Wt 342.6 lb

## 2021-10-26 DIAGNOSIS — D45 Polycythemia vera: Secondary | ICD-10-CM | POA: Diagnosis not present

## 2021-10-26 DIAGNOSIS — I5022 Chronic systolic (congestive) heart failure: Secondary | ICD-10-CM

## 2021-10-26 DIAGNOSIS — I480 Paroxysmal atrial fibrillation: Secondary | ICD-10-CM | POA: Diagnosis not present

## 2021-10-26 DIAGNOSIS — E876 Hypokalemia: Secondary | ICD-10-CM | POA: Diagnosis not present

## 2021-10-26 NOTE — Progress Notes (Unsigned)
ZIO XT mailed to pt's home address. 

## 2021-10-26 NOTE — Patient Instructions (Addendum)
Medication Instructions:  Your physician recommends that you continue on your current medications as directed. Please refer to the Current Medication list given to you today.  *If you need a refill on your cardiac medications before your next appointment, please call your pharmacy*   Lab Work: BMET and CBC today If you have labs (blood work) drawn today and your tests are completely normal, you will receive your results only by: [facility] (if you have MyChart) OR A paper copy in the mail If you have any lab test that is abnormal or we need to change your treatment, we will call you to review the results.   Testing/Procedures: Your physician has recommended that you have a Cardioversion (DCCV). Electrical Cardioversion uses a jolt of electricity to your heart either through paddles or wired patches attached to your chest. This is a controlled, usually prescheduled, procedure. Defibrillation is done under light anesthesia in the hospital, and you usually go home the day of the procedure. This is done to get your heart back into a normal rhythm. You are not awake for the procedure. Please see the instruction sheet given to you today.   ZIO XT- Long Term Monitor Instructions  Your physician has requested you wear a ZIO patch monitor for 3 days.  This is a single patch monitor. [employer] supplies one patch monitor per enrollment. Additional stickers are not available. Please do not apply patch if you will be having a Nuclear Stress Test,  Echocardiogram, Cardiac CT, MRI, or Chest Xray during the period you would be wearing the  monitor. The patch cannot be worn during these tests. You cannot remove and re-apply the  ZIO XT patch monitor.  Your ZIO patch monitor will be mailed 3 day USPS to your address on file. It may take 3-5 days  to receive your monitor after you have been enrolled.  Once you have received your monitor, please review the enclosed instructions. Your monitor  has already  been registered assigning a specific monitor serial # to you.  Billing and Patient Assistance Program Information  We have supplied [employer] with any of your insurance information on file for billing purposes. [employer] offers a sliding scale Patient Assistance Program for patients that do not have  insurance, or whose insurance does not completely cover the cost of the ZIO monitor.  You must apply for the Patient Assistance Program to qualify for this discounted rate.  To apply, please call [employer] at [phone], select option 4, select option 2, ask to apply for  Patient Assistance Program. Theodore Demark will ask your household income, and how many people  are in your household. They will quote your out-of-pocket cost based on that information.  [employer] will also be able to set up a 17-month interest-free payment plan if needed.  Applying the monitor   Shave hair from upper left chest.  Hold abrader disc by orange tab. Rub abrader in 40 strokes over the upper left chest as  indicated in your monitor instructions.  Clean area with 4 enclosed alcohol pads. Let dry.  Apply patch as indicated in monitor instructions. Patch will be placed under collarbone on left  side of chest with arrow pointing upward.  Rub patch adhesive wings for 2 minutes. Remove white label marked "1". Remove the white  label marked "2". Rub patch adhesive wings for 2 additional minutes.  While looking in a mirror, press and release button in center of patch. A small green light will  flash 3-4 times. This  will be your only indicator that the monitor has been turned on.  Do not shower for the first 24 hours. You may shower after the first 24 hours.  Press the button if you feel a symptom. You will hear a small click. Record Date, Time and  Symptom in the Patient Logbook.  When you are ready to remove the patch, follow instructions on the last 2 pages of Patient  Logbook. Stick patch monitor onto the last page of Patient  Logbook.  Place Patient Logbook in the blue and white box. Use locking tab on box and tape box closed  securely. The blue and white box has prepaid postage on it. Please place it in the mailbox as  soon as possible. Your physician should have your test results approximately 7 days after the  monitor has been mailed back to [facility].  Call [facility] at [phone] if you have questions regarding  your ZIO XT patch monitor. Call them immediately if you see an orange light blinking on your  monitor.  If your monitor falls off in less than 4 days, contact our Monitor department at [phone].  If your monitor becomes loose or falls off after 4 days call [employer] at [phone] for  suggestions on securing your monitor    Follow-Up: At [facility], you and your health needs are our priority.  As part of our continuing mission to provide you with exceptional heart care, we have created designated Provider Care Teams.  These Care Teams include your primary Cardiologist (physician) and Advanced Practice Providers (APPs -  Physician Assistants and Nurse Practitioners) who all work together to provide you with the care you need, when you need it.  We recommend signing up for the patient portal called "MyChart".  Sign up information is provided on this After Visit Summary.  MyChart is used to connect with patients for Virtual Visits (Telemedicine).  Patients are able to view lab/test results, encounter notes, upcoming appointments, etc.  Non-urgent messages can be sent to your provider as well.   To learn more about what you can do with MyChart, go to [email or web address].    Your next appointment:   1 month with Dr Olin Pia PA  Other Instructions Referred to Hematology - Someone will call you to schedule   You are scheduled for a Cardioversion on 10/30/2021 with Dr. Johnsie Cancel.  Please arrive at the [facility] (Main Entrance A) at [facility]: [address] [place], [place] [address] at 1030 am.   DIET: Nothing to eat or drink after midnight except a sip of water with medications (see medication instructions below)  FYI: For your safety, and to allow [place] to monitor your vital signs accurately during the surgery/procedure we request that   if you have artificial nails, gel coating, SNS etc. Please have those removed prior to your surgery/procedure. Not having the nail coverings /polish removed may result in cancellation or delay of your surgery/procedure.   Medication Instructions: Hold all medications except your Eliquis.  You may take your Eliquis with enough water to get this down safely the morning of your cardioversion.  Continue your anticoagulant: Eliquis You will need to continue your anticoagulant after      your     procedure until you are told by your  Provider that it is safe to stop   Labs: BMET and CBC today  You must have a responsible person to drive you home and stay in the waiting area  during your procedure. Failure to do so could result in cancellation.  Bring your insurance cards.  *Special Note: Every effort is made to have your procedure done on time. Occasionally there are emergencies that occur at the hospital that may cause delays. Please be patient if a delay does occur.    Important Information About Sugar
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
## 2021-10-27 LAB — BASIC METABOLIC PANEL
BUN/Creatinine Ratio: 10 (ref 10–24)
BUN: 11 mg/dL (ref 8–27)
CO2: 21 mmol/L (ref 20–29)
Calcium: 9.9 mg/dL (ref 8.6–10.2)
Chloride: 103 mmol/L (ref 96–106)
Creatinine, Ser: 1.09 mg/dL (ref 0.76–1.27)
Glucose: 112 mg/dL — ABNORMAL HIGH (ref 70–99)
Potassium: 4.1 mmol/L (ref 3.5–5.2)
Sodium: 142 mmol/L (ref 134–144)
eGFR: 77 mL/min/{1.73_m2} (ref >59)

## 2021-10-27 LAB — CBC
Hematocrit: 58.9 % — ABNORMAL HIGH (ref 37.5–51.0)
Hemoglobin: 19.6 g/dL — ABNORMAL HIGH (ref 13.0–17.7)
MCH: 26.3 pg — ABNORMAL LOW (ref 26.6–33.0)
MCHC: 33.3 g/dL (ref 31.5–35.7)
MCV: 79 fL (ref 79–97)
Platelets: 317 10*3/uL (ref 150–450)
RBC: 7.45 x10E6/uL (ref 4.14–5.80)
RDW: 16.5 % — ABNORMAL HIGH (ref 11.6–15.4)
WBC: 9 10*3/uL (ref 3.4–10.8)

## 2021-10-29 ENCOUNTER — Other Ambulatory Visit: Payer: Self-pay | Admitting: Internal Medicine

## 2021-10-30 ENCOUNTER — Encounter (HOSPITAL_COMMUNITY): Payer: Self-pay | Admitting: Cardiovascular Disease

## 2021-10-30 ENCOUNTER — Encounter (HOSPITAL_COMMUNITY)
Admission: RE | Disposition: A | Discharge status: Home / Self Care | Payer: Self-pay | Source: Ambulatory Visit | Attending: Cardiovascular Disease

## 2021-10-30 ENCOUNTER — Ambulatory Visit (HOSPITAL_BASED_OUTPATIENT_CLINIC_OR_DEPARTMENT_OTHER): Payer: No Typology Code available for payment source | Admitting: Anesthesiology

## 2021-10-30 ENCOUNTER — Other Ambulatory Visit: Payer: Self-pay

## 2021-10-30 ENCOUNTER — Ambulatory Visit (HOSPITAL_COMMUNITY): Payer: No Typology Code available for payment source | Admitting: Anesthesiology

## 2021-10-30 ENCOUNTER — Ambulatory Visit (HOSPITAL_COMMUNITY)
Admission: RE | Admit: 2021-10-30 | Discharge: 2021-10-30 | Disposition: A | Discharge status: Home / Self Care | Payer: No Typology Code available for payment source | Source: Ambulatory Visit | Attending: Cardiovascular Disease | Admitting: Cardiovascular Disease

## 2021-10-30 DIAGNOSIS — I480 Paroxysmal atrial fibrillation: Secondary | ICD-10-CM

## 2021-10-30 DIAGNOSIS — I252 Old myocardial infarction: Secondary | ICD-10-CM | POA: Diagnosis not present

## 2021-10-30 DIAGNOSIS — G709 Myoneural disorder, unspecified: Secondary | ICD-10-CM | POA: Insufficient documentation

## 2021-10-30 DIAGNOSIS — G4730 Sleep apnea, unspecified: Secondary | ICD-10-CM | POA: Insufficient documentation

## 2021-10-30 DIAGNOSIS — Z6841 Body Mass Index (BMI) 40.0 and over, adult: Secondary | ICD-10-CM | POA: Diagnosis not present

## 2021-10-30 DIAGNOSIS — Z7901 Long term (current) use of anticoagulants: Secondary | ICD-10-CM | POA: Diagnosis not present

## 2021-10-30 DIAGNOSIS — I110 Hypertensive heart disease with heart failure: Secondary | ICD-10-CM

## 2021-10-30 DIAGNOSIS — I509 Heart failure, unspecified: Secondary | ICD-10-CM | POA: Diagnosis not present

## 2021-10-30 DIAGNOSIS — Z7984 Long term (current) use of oral hypoglycemic drugs: Secondary | ICD-10-CM | POA: Diagnosis not present

## 2021-10-30 DIAGNOSIS — I4891 Unspecified atrial fibrillation: Secondary | ICD-10-CM

## 2021-10-30 DIAGNOSIS — E119 Type 2 diabetes mellitus without complications: Secondary | ICD-10-CM | POA: Diagnosis not present

## 2021-10-30 DIAGNOSIS — Z87891 Personal history of nicotine dependence: Secondary | ICD-10-CM

## 2021-10-30 DIAGNOSIS — I4819 Other persistent atrial fibrillation: Secondary | ICD-10-CM | POA: Insufficient documentation

## 2021-10-30 DIAGNOSIS — I428 Other cardiomyopathies: Secondary | ICD-10-CM | POA: Diagnosis present

## 2021-10-30 DIAGNOSIS — I493 Ventricular premature depolarization: Secondary | ICD-10-CM | POA: Diagnosis not present

## 2021-10-30 DIAGNOSIS — Z79899 Other long term (current) drug therapy: Secondary | ICD-10-CM | POA: Diagnosis not present

## 2021-10-30 DIAGNOSIS — I340 Nonrheumatic mitral (valve) insufficiency: Secondary | ICD-10-CM | POA: Diagnosis not present

## 2021-10-30 DIAGNOSIS — D45 Polycythemia vera: Secondary | ICD-10-CM | POA: Insufficient documentation

## 2021-10-30 HISTORY — PX: CARDIOVERSION: SHX1299

## 2021-10-30 LAB — GLUCOSE, CAPILLARY: Glucose-Capillary: 97 mg/dL (ref 70–99)

## 2021-10-30 SURGERY — CARDIOVERSION
Anesthesia: General

## 2021-10-30 MED ORDER — PROPOFOL 10 MG/ML IV BOLUS
INTRAVENOUS | Status: DC | PRN
  Administered 2021-10-30: 80 mg via INTRAVENOUS
  Administered 2021-10-30: 20 mg via INTRAVENOUS

## 2021-10-30 MED ORDER — SODIUM CHLORIDE 0.9 % IV SOLN: INTRAVENOUS | Status: DC

## 2021-10-30 MED ORDER — LIDOCAINE 2% (20 MG/ML) 5 ML SYRINGE
INTRAMUSCULAR | Status: DC | PRN
  Administered 2021-10-30: 60 mg via INTRAVENOUS
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
## 2021-10-30 NOTE — Interval H&P Note (Signed)
History and Physical Interval Note:  10/30/2021 11:04 AM  John Conner  has presented today for surgery, with the diagnosis of AFIB.  The various methods of treatment have been discussed with the patient and family. After consideration of risks, benefits and other options for treatment, the patient has consented to  Procedure(s): CARDIOVERSION (N/A) as a surgical intervention.  The patient's history has been reviewed, patient examined, no change in status, stable for surgery.  I have reviewed the patient's chart and labs.  Questions were answered to the patient's satisfaction.     Jenkins Rouge

## 2021-10-30 NOTE — CV Procedure (Signed)
DCC: On Rx eliquis no missed doses Anesthesia: Propofol  DCC x 1 150 J biphasic converted form afib rate 90 to NSR rate 79 bpm  No immediate neurologic sequelae  Jenkins Rouge MD [facility]

## 2021-10-30 NOTE — Discharge Instructions (Signed)

## 2021-10-30 NOTE — Transfer of Care (Signed)
Immediate Anesthesia Transfer of Care Note  Patient: John Conner  Procedure(s) Performed: CARDIOVERSION  Patient Location: Endoscopy Unit  Anesthesia Type:General  Level of Consciousness: drowsy  Airway & Oxygen Therapy: Patient Spontanous Breathing  Post-op Assessment: Report given to RN and Post -op Vital signs reviewed and stable  Post vital signs: Reviewed and stable  Last Vitals:  Vitals Value Taken Time  BP 121/75   Temp    Pulse 78   Resp 18   SpO2 94     Last Pain:  Vitals:   10/30/21 1020  TempSrc: Oral  PainSc: 0-No pain         Complications: No notable events documented.

## 2021-10-31 NOTE — Anesthesia Postprocedure Evaluation (Signed)
Anesthesia Post Note  Patient: John Conner  Procedure(s) Performed: CARDIOVERSION     Patient location during evaluation: Endoscopy Anesthesia Type: General Level of consciousness: awake and alert Pain management: pain level controlled Vital Signs Assessment: post-procedure vital signs reviewed and stable Respiratory status: spontaneous breathing, nonlabored ventilation, respiratory function stable and patient connected to nasal cannula oxygen Cardiovascular status: blood pressure returned to baseline and stable Postop Assessment: no apparent nausea or vomiting Anesthetic complications: no   No notable events documented.  Last Vitals:  Vitals:   10/30/21 1150 10/30/21 1151  BP: 103/73 103/73  Pulse: 68 66  Resp: 13 14  Temp:    SpO2: 95% 97%    Last Pain:  Vitals:   10/30/21 1151  TempSrc:   PainSc: 0-No pain                 Saiya Crist S

## 2021-11-04 ENCOUNTER — Encounter (HOSPITAL_COMMUNITY): Payer: Self-pay | Admitting: Cardiovascular Disease

## 2021-11-06 ENCOUNTER — Emergency Department (HOSPITAL_BASED_OUTPATIENT_CLINIC_OR_DEPARTMENT_OTHER): Payer: No Typology Code available for payment source

## 2021-11-06 ENCOUNTER — Other Ambulatory Visit: Payer: Self-pay

## 2021-11-06 ENCOUNTER — Encounter (HOSPITAL_BASED_OUTPATIENT_CLINIC_OR_DEPARTMENT_OTHER): Payer: Self-pay | Admitting: Emergency Medicine

## 2021-11-06 ENCOUNTER — Other Ambulatory Visit (HOSPITAL_BASED_OUTPATIENT_CLINIC_OR_DEPARTMENT_OTHER): Payer: Self-pay

## 2021-11-06 ENCOUNTER — Emergency Department (HOSPITAL_BASED_OUTPATIENT_CLINIC_OR_DEPARTMENT_OTHER)
Admission: EM | Admit: 2021-11-06 | Discharge: 2021-11-06 | Disposition: A | Discharge status: Home / Self Care | Payer: No Typology Code available for payment source | Attending: Emergency Medicine | Admitting: Emergency Medicine

## 2021-11-06 DIAGNOSIS — Z7901 Long term (current) use of anticoagulants: Secondary | ICD-10-CM | POA: Diagnosis not present

## 2021-11-06 DIAGNOSIS — R0789 Other chest pain: Secondary | ICD-10-CM | POA: Insufficient documentation

## 2021-11-06 DIAGNOSIS — I10 Essential (primary) hypertension: Secondary | ICD-10-CM | POA: Diagnosis not present

## 2021-11-06 DIAGNOSIS — Z79899 Other long term (current) drug therapy: Secondary | ICD-10-CM | POA: Diagnosis not present

## 2021-11-06 DIAGNOSIS — E119 Type 2 diabetes mellitus without complications: Secondary | ICD-10-CM | POA: Insufficient documentation

## 2021-11-06 LAB — COMPREHENSIVE METABOLIC PANEL
ALT: 34 U/L (ref 0–44)
AST: 28 U/L (ref 15–41)
Albumin: 3.8 g/dL (ref 3.5–5.0)
Alkaline Phosphatase: 90 U/L (ref 38–126)
Anion gap: 9 (ref 5–15)
BUN: 12 mg/dL (ref 8–23)
CO2: 24 mmol/L (ref 22–32)
Calcium: 9.2 mg/dL (ref 8.9–10.3)
Chloride: 106 mmol/L (ref 98–111)
Creatinine, Ser: 0.97 mg/dL (ref 0.61–1.24)
GFR, Estimated: >60 mL/min (ref >60)
Glucose, Bld: 120 mg/dL — ABNORMAL HIGH (ref 70–99)
Potassium: 3.7 mmol/L (ref 3.5–5.1)
Sodium: 139 mmol/L (ref 135–145)
Total Bilirubin: 1.3 mg/dL — ABNORMAL HIGH (ref 0.3–1.2)
Total Protein: 7.5 g/dL (ref 6.5–8.1)

## 2021-11-06 LAB — CBC WITH DIFFERENTIAL/PLATELET
Abs Immature Granulocytes: 0.03 10*3/uL (ref 0.00–0.07)
Basophils Absolute: 0.1 10*3/uL (ref 0.0–0.1)
Basophils Relative: 1 %
Eosinophils Absolute: 0.2 10*3/uL (ref 0.0–0.5)
Eosinophils Relative: 2 %
HCT: 60.3 % — ABNORMAL HIGH (ref 39.0–52.0)
Hemoglobin: 19.3 g/dL — ABNORMAL HIGH (ref 13.0–17.0)
Immature Granulocytes: 0 %
Lymphocytes Relative: 34 %
Lymphs Abs: 3.3 10*3/uL (ref 0.7–4.0)
MCH: 26.4 pg (ref 26.0–34.0)
MCHC: 32 g/dL (ref 30.0–36.0)
MCV: 82.5 fL (ref 80.0–100.0)
Monocytes Absolute: 0.6 10*3/uL (ref 0.1–1.0)
Monocytes Relative: 6 %
Neutro Abs: 5.5 10*3/uL (ref 1.7–7.7)
Neutrophils Relative %: 57 %
Platelets: 324 10*3/uL (ref 150–400)
RBC: 7.31 MIL/uL — ABNORMAL HIGH (ref 4.22–5.81)
RDW: 18.1 % — ABNORMAL HIGH (ref 11.5–15.5)
WBC: 9.8 10*3/uL (ref 4.0–10.5)
nRBC: 0 % (ref 0.0–0.2)

## 2021-11-06 LAB — BRAIN NATRIURETIC PEPTIDE: B Natriuretic Peptide: 103.2 pg/mL — ABNORMAL HIGH (ref 0.0–100.0)

## 2021-11-06 LAB — LIPASE, BLOOD: Lipase: 34 U/L (ref 11–51)

## 2021-11-06 LAB — TROPONIN I (HIGH SENSITIVITY): Troponin I (High Sensitivity): 14 ng/L (ref <18)

## 2021-11-06 MED ORDER — DICLOFENAC SODIUM 1 % EX GEL
2.0000 g | Freq: Four times a day (QID) | CUTANEOUS | 0 refills | Status: AC
  Filled 2021-11-06: qty 100, 30d supply, fill #0

## 2021-11-06 NOTE — ED Notes (Signed)
Pt reports 7/10 left lower chest pain since last night and notes that he coughed up blood this morning after he woke up. Pt states he was cardioverted last week after he went into a fib, and he has had intermittent headaches recently. He was supposed to have a cardiology appointment yesterday but the office called to reschedule that appointment to Oct 24.

## 2021-11-06 NOTE — ED Triage Notes (Signed)
Patient presents to ED via POV from home. Here with left sided chest pain that began last night while patient was lying down. Denies shortness of breath, nausea or vomiting.

## 2021-11-06 NOTE — Discharge Instructions (Addendum)
You were evaluated for left sided chest pain.  Blood work and imaging were reassuring. Prescription for diclofenac gel. You may apply the gel to left lower chest wall for the pain. You can also take tylenol for the pain. Please keep your scheduled appointment with your PCP.  If your symptoms worsen, please return for evaluation.

## 2021-11-06 NOTE — ED Provider Notes (Cosign Needed Addendum)
[facility] EMERGENCY DEPARTMENT Provider Note   CSN: [id number] Arrival date & time: 11/06/21  0945     History  Chief Complaint  Patient presents with   Chest Pain    John Conner is a 61 y.o. male.  Patient with history of A-fib s/p DCCV on Eliquis, HFrEF, HTN, and T2DM presents with left sided chest pain that started last night.  He was laying in bed when the pain started. He describes it as sharp and intermittent lasting for about 15 minutes. Pain is located mostly lower left chest and some LUQ. This morning he had one episode of coughing with blood-tinged sputum. Denies shortness of breath, n/v/d or URI symptoms. No recent trauma or injury.   The history is provided by the patient.  Chest Pain Associated symptoms: abdominal pain and cough   Associated symptoms: no fever, no nausea, no shortness of breath and no vomiting       Home Medications Prior to Admission medications   Medication Sig Start Date End Date Taking? Authorizing Provider  diclofenac Sodium (VOLTAREN) 1 % GEL Apply 2 g topically 4 (four) times daily. 11/06/21  Yes Angelique Blonder, DO  apixaban (ELIQUIS) 5 MG TABS tablet Take 1 tablet (5 mg total) by mouth 2 (two) times daily. 09/24/21   Bensimhon, Shaune Pascal, MD  atorvastatin (LIPITOR) 80 MG tablet Take 1 tablet (80 mg total) by mouth daily. 10/23/21   Lyda Jester M, PA-C  carvedilol (COREG) 12.5 MG tablet Take 1 tablet (12.5 mg total) by mouth 2 (two) times daily with a meal. 10/23/21   Consuelo Pandy, PA-C  Cholecalciferol (VITAMIN D3) 10 MCG (400 UNIT) tablet Take 400 Units by mouth daily.    [provider]  empagliflozin (JARDIANCE) 10 MG TABS tablet Take 1 tablet (10 mg total) by mouth daily. 04/04/21   Clegg, Amy D, NP  fluticasone (FLONASE) 50 MCG/ACT nasal spray Place 1 spray into both nostrils as needed for allergies or rhinitis.    [provider]  furosemide (LASIX) 40 MG tablet Take 2 tablets (80 mg total) by  mouth every morning AND 1 tablet (40 mg total) every evening. 04/06/21   Clegg, Amy D, NP  metFORMIN (GLUCOPHAGE) 500 MG tablet Take 500 mg by mouth 2 (two) times daily with a meal.    [provider]  potassium chloride SA (KLOR-CON M) 20 MEQ tablet Take 1 tablet (20 mEq total) by mouth daily. 02/08/21   Domenic Polite, MD  sacubitril-valsartan (ENTRESTO) 49-51 MG Take 1 tablet by mouth 2 (two) times daily. 08/15/21   Bensimhon, Shaune Pascal, MD  spironolactone (ALDACTONE) 25 MG tablet TAKE 1/2 TABLET BY MOUTH EVERY DAY 10/08/21   Bensimhon, Shaune Pascal, MD      Allergies    Diphenhydramine hcl and Lisinopril    Review of Systems   Review of Systems  Constitutional:  Negative for chills and fever.  Respiratory:  Positive for cough. Negative for shortness of breath.   Cardiovascular:  Positive for chest pain.  Gastrointestinal:  Positive for abdominal pain. Negative for diarrhea, nausea and vomiting.    Physical Exam Updated Vital Signs BP 112/85   Pulse 64   Temp 97.7 F (36.5 C) (Oral)   Resp 17   SpO2 97%  Physical Exam Constitutional:      General: He is not in acute distress.    Appearance: He is not ill-appearing.  HENT:     Head: Normocephalic and atraumatic.  Cardiovascular:  Rate and Rhythm: Normal rate and regular rhythm.  Pulmonary:     Effort: Pulmonary effort is normal.  Chest:     Chest wall: Tenderness (lower left side) present.  Abdominal:     Palpations: Abdomen is soft.     Tenderness: There is abdominal tenderness (LUQ). There is no guarding or rebound.  Skin:    General: Skin is warm and dry.  Neurological:     Mental Status: He is alert.     ED Results / Procedures / Treatments   Labs (all labs ordered are listed, but only abnormal results are displayed) Labs Reviewed  COMPREHENSIVE METABOLIC PANEL - Abnormal; Notable for the following components:      Result Value   Glucose, Bld 120 (*)    Total Bilirubin 1.3 (*)    All other components  within normal limits  BRAIN NATRIURETIC PEPTIDE - Abnormal; Notable for the following components:   B Natriuretic Peptide 103.2 (*)    All other components within normal limits  CBC WITH DIFFERENTIAL/PLATELET - Abnormal; Notable for the following components:   RBC 7.31 (*)    Hemoglobin 19.3 (*)    HCT 60.3 (*)    RDW 18.1 (*)    All other components within normal limits  LIPASE, BLOOD  TROPONIN I (HIGH SENSITIVITY)  TROPONIN I (HIGH SENSITIVITY)    EKG EKG Interpretation  Date/Time:  Tuesday November 06 2021 09:53:35 EDT Ventricular Rate:  70 PR Interval:  30 QRS Duration: 110 QT Interval:  400 QTC Calculation: 432 R Axis:   -80 Text Interpretation: Sinus rhythm Multiform ventricular premature complexes Short PR interval Probable left atrial enlargement Otherwise no significant change Confirmed by Deno Etienne [phone]) on 11/06/2021 10:05:29 AM  Radiology DG Chest 2 View  Result Date: 11/06/2021 CLINICAL DATA:  61 year old male with left side chest pain beginning last night. EXAM: CHEST - 2 VIEW COMPARISON:  Chest radiographs 09/29/2021 and earlier. FINDINGS: Stable mild cardiomegaly. Other mediastinal contours are within normal limits. Visualized tracheal air column is within normal limits. No pneumothorax, pleural effusion or confluent pulmonary opacity. Stable 2 improved appearance of pulmonary vascularity since last month, no overt edema. No acute osseous abnormality identified. Negative visible bowel gas. IMPRESSION: Stable cardiomegaly.  No acute cardiopulmonary abnormality. Electronically Signed   By: Genevie Ann M.D.   On: 11/06/2021 11:18    Procedures Procedures   Medications Ordered in ED Medications - No data to display  ED Course/ Medical Decision Making/ A&P                           Medical Decision Making Amount and/or Complexity of Data Reviewed Labs: ordered. Radiology: ordered.  Patient presents with left-sided chest pain. Pain is reproducible with palpation  along lower left chest wall and LUQ of abdomen. Troponin, lipase and CMP unremarkable. BNP improved. CBC relatively unchanged from prior. He has hematology appointment scheduled already. CXR showed stable cardiomegaly with no signs of acute processes. His vitals were stable. Patient is stable for discharge. Prescription for diclofenac gel sent. Return precautions given.   Final Clinical Impression(s) / ED Diagnoses Final diagnoses:  Chest wall pain    Rx / DC Orders ED Discharge Orders          Ordered    diclofenac Sodium (VOLTAREN) 1 % GEL  4 times daily       Note to Pharmacy: Not 2 g, please dispense 2 boxes   11/06/21 1300  Angelique Blonder, DO 11/06/21 1511    Angelique Blonder, DO 11/06/21 [place], [place], DO 11/07/21 [phone]

## 2021-11-07 ENCOUNTER — Other Ambulatory Visit (HOSPITAL_BASED_OUTPATIENT_CLINIC_OR_DEPARTMENT_OTHER): Payer: Self-pay

## 2021-11-12 ENCOUNTER — Telehealth: Payer: Self-pay | Admitting: Internal Medicine

## 2021-11-12 ENCOUNTER — Inpatient Hospital Stay: Payer: No Typology Code available for payment source | Attending: Hematology & Oncology

## 2021-11-12 ENCOUNTER — Inpatient Hospital Stay (HOSPITAL_BASED_OUTPATIENT_CLINIC_OR_DEPARTMENT_OTHER): Payer: No Typology Code available for payment source | Admitting: Family

## 2021-11-12 ENCOUNTER — Other Ambulatory Visit: Payer: Self-pay

## 2021-11-12 ENCOUNTER — Other Ambulatory Visit: Payer: Self-pay | Admitting: Family

## 2021-11-12 ENCOUNTER — Inpatient Hospital Stay: Payer: No Typology Code available for payment source

## 2021-11-12 ENCOUNTER — Encounter: Payer: Self-pay | Admitting: Family

## 2021-11-12 VITALS — BP 107/74 | HR 67 | Temp 97.6°F | Resp 20 | Ht 70.0 in | Wt 336.1 lb

## 2021-11-12 DIAGNOSIS — E1140 Type 2 diabetes mellitus with diabetic neuropathy, unspecified: Secondary | ICD-10-CM | POA: Diagnosis not present

## 2021-11-12 DIAGNOSIS — D751 Secondary polycythemia: Secondary | ICD-10-CM

## 2021-11-12 DIAGNOSIS — I4891 Unspecified atrial fibrillation: Secondary | ICD-10-CM | POA: Diagnosis not present

## 2021-11-12 DIAGNOSIS — Z7901 Long term (current) use of anticoagulants: Secondary | ICD-10-CM | POA: Insufficient documentation

## 2021-11-12 DIAGNOSIS — Z7984 Long term (current) use of oral hypoglycemic drugs: Secondary | ICD-10-CM | POA: Insufficient documentation

## 2021-11-12 DIAGNOSIS — G4730 Sleep apnea, unspecified: Secondary | ICD-10-CM | POA: Diagnosis not present

## 2021-11-12 DIAGNOSIS — D45 Polycythemia vera: Secondary | ICD-10-CM | POA: Insufficient documentation

## 2021-11-12 DIAGNOSIS — Z87891 Personal history of nicotine dependence: Secondary | ICD-10-CM | POA: Insufficient documentation

## 2021-11-12 LAB — RETICULOCYTES
Immature Retic Fract: 10.5 % (ref 2.3–15.9)
RBC.: 7.45 MIL/uL — ABNORMAL HIGH (ref 4.22–5.81)
Retic Count, Absolute: 103.6 10*3/uL (ref 19.0–186.0)
Retic Ct Pct: 1.4 % (ref 0.4–3.1)

## 2021-11-12 LAB — CMP (CANCER CENTER ONLY)
ALT: 30 U/L (ref 0–44)
AST: 22 U/L (ref 15–41)
Albumin: 4.4 g/dL (ref 3.5–5.0)
Alkaline Phosphatase: 91 U/L (ref 38–126)
Anion gap: 9 (ref 5–15)
BUN: 13 mg/dL (ref 8–23)
CO2: 27 mmol/L (ref 22–32)
Calcium: 9.5 mg/dL (ref 8.9–10.3)
Chloride: 103 mmol/L (ref 98–111)
Creatinine: 1.11 mg/dL (ref 0.61–1.24)
GFR, Estimated: >60 mL/min (ref >60)
Glucose, Bld: 114 mg/dL — ABNORMAL HIGH (ref 70–99)
Potassium: 3.8 mmol/L (ref 3.5–5.1)
Sodium: 139 mmol/L (ref 135–145)
Total Bilirubin: 1.3 mg/dL — ABNORMAL HIGH (ref 0.3–1.2)
Total Protein: 7.4 g/dL (ref 6.5–8.1)

## 2021-11-12 LAB — LACTATE DEHYDROGENASE: LDH: 170 U/L (ref 98–192)

## 2021-11-12 LAB — CBC WITH DIFFERENTIAL (CANCER CENTER ONLY)
Abs Immature Granulocytes: 0.03 10*3/uL (ref 0.00–0.07)
Basophils Absolute: 0.1 10*3/uL (ref 0.0–0.1)
Basophils Relative: 1 %
Eosinophils Absolute: 0.3 10*3/uL (ref 0.0–0.5)
Eosinophils Relative: 3 %
HCT: 61.4 % — ABNORMAL HIGH (ref 39.0–52.0)
Hemoglobin: 19.3 g/dL — ABNORMAL HIGH (ref 13.0–17.0)
Immature Granulocytes: 0 %
Lymphocytes Relative: 42 %
Lymphs Abs: 4.1 10*3/uL — ABNORMAL HIGH (ref 0.7–4.0)
MCH: 25.9 pg — ABNORMAL LOW (ref 26.0–34.0)
MCHC: 31.4 g/dL (ref 30.0–36.0)
MCV: 82.5 fL (ref 80.0–100.0)
Monocytes Absolute: 0.7 10*3/uL (ref 0.1–1.0)
Monocytes Relative: 7 %
Neutro Abs: 4.4 10*3/uL (ref 1.7–7.7)
Neutrophils Relative %: 47 %
Platelet Count: 324 10*3/uL (ref 150–400)
RBC: 7.44 MIL/uL — ABNORMAL HIGH (ref 4.22–5.81)
RDW: 17.9 % — ABNORMAL HIGH (ref 11.5–15.5)
WBC Count: 9.6 10*3/uL (ref 4.0–10.5)
nRBC: 0 % (ref 0.0–0.2)

## 2021-11-12 LAB — IRON AND IRON BINDING CAPACITY (CC-WL,HP ONLY)
Iron: 108 ug/dL (ref 45–182)
Saturation Ratios: 34 % (ref 17.9–39.5)
TIBC: 315 ug/dL (ref 250–450)
UIBC: 207 ug/dL (ref 117–376)

## 2021-11-12 LAB — FERRITIN: Ferritin: 30 ng/mL (ref 24–336)

## 2021-11-12 LAB — SAVE SMEAR(SSMR), FOR PROVIDER SLIDE REVIEW

## 2021-11-12 NOTE — Progress Notes (Signed)
John Conner presents today for phlebotomy per MD orders. Phlebotomy procedure started at 1200 and ended at 1230. 500 grams removed. Patient observed for 30 minutes after procedure without any incident. Patient tolerated procedure well. IV needle removed intact.

## 2021-11-12 NOTE — Telephone Encounter (Signed)
11/12/2021 12:24 PM EDT Back to Top    Spoke with pt and advised of lab result per Dr Caryl Comes and will need to re-establish with hematology.  Pt states he is currently at an appointment with hematology.  Pt thanked Therapist, sports for the phone call.

## 2021-11-12 NOTE — Patient Instructions (Signed)

## 2021-11-12 NOTE — Telephone Encounter (Signed)
Patient is returning RN's call regarding lab results.
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
## 2021-11-13 LAB — ERYTHROPOIETIN: Erythropoietin: 3.3 m[IU]/mL (ref 2.6–18.5)

## 2021-11-19 ENCOUNTER — Inpatient Hospital Stay: Payer: No Typology Code available for payment source

## 2021-11-19 VITALS — BP 91/68 | HR 53 | Temp 98.0°F | Resp 17

## 2021-11-19 DIAGNOSIS — D45 Polycythemia vera: Secondary | ICD-10-CM | POA: Diagnosis not present

## 2021-11-19 DIAGNOSIS — D751 Secondary polycythemia: Secondary | ICD-10-CM

## 2021-11-19 LAB — JAK2 (INCLUDING V617F AND EXON 12), MPL,& CALR-NEXT GEN SEQ

## 2021-11-19 NOTE — Patient Instructions (Signed)

## 2021-11-19 NOTE — Progress Notes (Signed)
Wymon Swaney presents today for phlebotomy per MD orders. Phlebotomy procedure started at 1104 to right ac via 20 ga angiocath and stopped at 1130 due to no flow.  368 grams removed. Site not clotted and flushes with no difficulty and positive blood return.  200 ml removed via syringes to same site without difficulty ending at 1152.  Patient observed for 30 minutes after procedure without any incident. Patient tolerated procedure well. IV needle removed intact.

## 2021-11-20 ENCOUNTER — Other Ambulatory Visit (HOSPITAL_COMMUNITY): Payer: Self-pay | Admitting: Adult Health
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
## 2021-11-23 ENCOUNTER — Ambulatory Visit: Payer: No Typology Code available for payment source | Admitting: Student

## 2021-11-23 DIAGNOSIS — I5022 Chronic systolic (congestive) heart failure: Secondary | ICD-10-CM

## 2021-11-23 DIAGNOSIS — D45 Polycythemia vera: Secondary | ICD-10-CM

## 2021-11-23 DIAGNOSIS — I493 Ventricular premature depolarization: Secondary | ICD-10-CM

## 2021-11-23 DIAGNOSIS — I480 Paroxysmal atrial fibrillation: Secondary | ICD-10-CM

## 2021-11-26 ENCOUNTER — Inpatient Hospital Stay: Payer: No Typology Code available for payment source

## 2021-11-26 VITALS — BP 93/59 | HR 75 | Temp 98.0°F | Resp 20

## 2021-11-26 DIAGNOSIS — D45 Polycythemia vera: Secondary | ICD-10-CM | POA: Diagnosis not present

## 2021-11-26 DIAGNOSIS — D751 Secondary polycythemia: Secondary | ICD-10-CM

## 2021-11-26 NOTE — Patient Instructions (Signed)

## 2021-11-26 NOTE — Progress Notes (Signed)
John Conner presents today for phlebotomy per MD orders. Phlebotomy procedure started at 1100 to left ac via 18 gauge angio cath and ended at 1125. 575 grams removed. Patient observed for 30 minutes after procedure without any incident. Patient tolerated procedure well. IV needle removed intact.

## 2021-11-27 ENCOUNTER — Telehealth: Payer: Self-pay | Admitting: Family

## 2021-11-27 NOTE — Telephone Encounter (Signed)
I was able to speak with John Conner and go over recent labs including iron studies and JAK 2 testing. Iron studies were stable. He was JAK 2 positive. I spoke with Dr. Marin Olp and at this point we will continue to treat with phlebotomy. No medication needed at this time. We will follow-up in a few weeks with repeat labs. He is doing well with phlebotomies so far. No questions or concerns at this time. Patient appreciative of call.

## 2021-12-03 ENCOUNTER — Inpatient Hospital Stay: Payer: No Typology Code available for payment source | Attending: Hematology & Oncology

## 2021-12-03 VITALS — BP 119/74 | HR 66 | Temp 98.3°F | Resp 16

## 2021-12-03 DIAGNOSIS — D45 Polycythemia vera: Secondary | ICD-10-CM | POA: Diagnosis present

## 2021-12-03 DIAGNOSIS — G4730 Sleep apnea, unspecified: Secondary | ICD-10-CM | POA: Diagnosis not present

## 2021-12-03 DIAGNOSIS — Z7984 Long term (current) use of oral hypoglycemic drugs: Secondary | ICD-10-CM | POA: Diagnosis not present

## 2021-12-03 DIAGNOSIS — Z7901 Long term (current) use of anticoagulants: Secondary | ICD-10-CM | POA: Insufficient documentation

## 2021-12-03 DIAGNOSIS — I4891 Unspecified atrial fibrillation: Secondary | ICD-10-CM | POA: Insufficient documentation

## 2021-12-03 DIAGNOSIS — D751 Secondary polycythemia: Secondary | ICD-10-CM

## 2021-12-03 DIAGNOSIS — E1140 Type 2 diabetes mellitus with diabetic neuropathy, unspecified: Secondary | ICD-10-CM | POA: Diagnosis not present

## 2021-12-03 DIAGNOSIS — Z87891 Personal history of nicotine dependence: Secondary | ICD-10-CM | POA: Insufficient documentation

## 2021-12-03 NOTE — Progress Notes (Signed)
John Conner presents today for phlebotomy per MD orders. Phlebotomy procedure started at 1111 and ended at 1131. 575 grams removed. Patient observed for 30 minutes after procedure without any incident. Patient tolerated procedure well. IV needle removed intact. Snack and drink taken.  Pt without complaints at time of discharge.

## 2021-12-05 ENCOUNTER — Other Ambulatory Visit (HOSPITAL_COMMUNITY): Payer: Self-pay | Admitting: Adult Health

## 2021-12-05 ENCOUNTER — Encounter: Payer: Self-pay | Admitting: Family

## 2021-12-12 ENCOUNTER — Encounter: Payer: Self-pay | Admitting: Family

## 2021-12-12 DIAGNOSIS — I428 Other cardiomyopathies: Secondary | ICD-10-CM | POA: Insufficient documentation

## 2021-12-14 ENCOUNTER — Ambulatory Visit: Payer: No Typology Code available for payment source | Attending: Internal Medicine | Admitting: Internal Medicine

## 2021-12-14 ENCOUNTER — Encounter: Payer: Self-pay | Admitting: Internal Medicine

## 2021-12-14 VITALS — BP 108/72 | HR 72 | Ht 70.0 in | Wt 329.4 lb

## 2021-12-14 DIAGNOSIS — I428 Other cardiomyopathies: Secondary | ICD-10-CM

## 2021-12-14 DIAGNOSIS — I493 Ventricular premature depolarization: Secondary | ICD-10-CM | POA: Diagnosis not present

## 2021-12-14 DIAGNOSIS — I4891 Unspecified atrial fibrillation: Secondary | ICD-10-CM | POA: Diagnosis not present

## 2021-12-14 MED ORDER — ENTRESTO 49-51 MG PO TABS: 1.0000 | ORAL_TABLET | Freq: Two times a day (BID) | ORAL | 3 refills | Status: DC

## 2021-12-14 MED ORDER — AMIODARONE HCL 200 MG PO TABS: 200.0000 mg | ORAL_TABLET | Freq: Every day | ORAL | 3 refills | Status: DC

## 2021-12-14 NOTE — Patient Instructions (Signed)
Medication Instructions:  Your physician has recommended you make the following change in your medication:  1) INCREASE Entresto 49-51 mg twice daily  2) START taking amiodarone 400 mg twice daily for two weeks, then 200 mg twice daily for two weeks, then 200 mg daily   *If you need a refill on your cardiac medications before your next appointment, please call your pharmacy*   Lab Work: IN 6 WEEKS: BMET and TSH If you have labs (blood work) drawn today and your tests are completely normal, you will receive your results only by: [place] (if you have MyChart) OR A paper copy in the mail If you have any lab test that is abnormal or we need to change your treatment, we will call you to review the results.   Testing/Procedures: Your physician has requested that you have an echocardiogram in 3 months. Echocardiography is a painless test that uses sound waves to create images of your heart. It provides your doctor with information about the size and shape of your heart and how well your heart's chambers and valves are working. This procedure takes approximately one hour. There are no restrictions for this procedure. Please do NOT wear cologne, perfume, aftershave, or lotions (deodorant is allowed). Please arrive 15 minutes prior to your appointment time.   Follow-Up: At [facility], you and your health needs are our priority.  As part of our continuing mission to provide you with exceptional heart care, we have created designated Provider Care Teams.  These Care Teams include your primary Cardiologist (physician) and Advanced Practice Providers (APPs -  Physician Assistants and Nurse Practitioners) who all work together to provide you with the care you need, when you need it.  Your next appointment:   4 month(s)  The format for your next appointment:   In Person  Provider:   You may see Virl Axe, MD or one of the following Advanced Practice Providers on your designated  Care Team:   Tommye Standard, [place] Legrand Como "Jonni Sanger" Chalmers Cater, [place]     Important Information About Sugar
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
## 2021-12-18 ENCOUNTER — Encounter: Payer: Self-pay | Admitting: Family

## 2021-12-18 ENCOUNTER — Inpatient Hospital Stay (HOSPITAL_BASED_OUTPATIENT_CLINIC_OR_DEPARTMENT_OTHER): Payer: No Typology Code available for payment source | Admitting: Family

## 2021-12-18 ENCOUNTER — Inpatient Hospital Stay: Payer: No Typology Code available for payment source

## 2021-12-18 ENCOUNTER — Telehealth: Payer: Self-pay

## 2021-12-18 VITALS — BP 126/83 | HR 61 | Temp 98.0°F | Resp 18 | Wt 330.1 lb

## 2021-12-18 VITALS — BP 99/61 | HR 52

## 2021-12-18 DIAGNOSIS — D500 Iron deficiency anemia secondary to blood loss (chronic): Secondary | ICD-10-CM

## 2021-12-18 DIAGNOSIS — I493 Ventricular premature depolarization: Secondary | ICD-10-CM

## 2021-12-18 DIAGNOSIS — D45 Polycythemia vera: Secondary | ICD-10-CM | POA: Diagnosis not present

## 2021-12-18 DIAGNOSIS — Z1589 Genetic susceptibility to other disease: Secondary | ICD-10-CM

## 2021-12-18 DIAGNOSIS — I4891 Unspecified atrial fibrillation: Secondary | ICD-10-CM

## 2021-12-18 DIAGNOSIS — D751 Secondary polycythemia: Secondary | ICD-10-CM

## 2021-12-18 LAB — CBC WITH DIFFERENTIAL (CANCER CENTER ONLY)
Abs Immature Granulocytes: 0.03 10*3/uL (ref 0.00–0.07)
Basophils Absolute: 0.1 10*3/uL (ref 0.0–0.1)
Basophils Relative: 1 %
Eosinophils Absolute: 0.4 10*3/uL (ref 0.0–0.5)
Eosinophils Relative: 3 %
HCT: 54.6 % — ABNORMAL HIGH (ref 39.0–52.0)
Hemoglobin: 16.7 g/dL (ref 13.0–17.0)
Immature Granulocytes: 0 %
Lymphocytes Relative: 41 %
Lymphs Abs: 4.6 10*3/uL — ABNORMAL HIGH (ref 0.7–4.0)
MCH: 25 pg — ABNORMAL LOW (ref 26.0–34.0)
MCHC: 30.6 g/dL (ref 30.0–36.0)
MCV: 81.7 fL (ref 80.0–100.0)
Monocytes Absolute: 0.7 10*3/uL (ref 0.1–1.0)
Monocytes Relative: 6 %
Neutro Abs: 5.5 10*3/uL (ref 1.7–7.7)
Neutrophils Relative %: 49 %
Platelet Count: 355 10*3/uL (ref 150–400)
RBC: 6.68 MIL/uL — ABNORMAL HIGH (ref 4.22–5.81)
RDW: 16.7 % — ABNORMAL HIGH (ref 11.5–15.5)
WBC Count: 11.4 10*3/uL — ABNORMAL HIGH (ref 4.0–10.5)
nRBC: 0 % (ref 0.0–0.2)

## 2021-12-18 LAB — CMP (CANCER CENTER ONLY)
ALT: 21 U/L (ref 0–44)
AST: 18 U/L (ref 15–41)
Albumin: 4.5 g/dL (ref 3.5–5.0)
Alkaline Phosphatase: 84 U/L (ref 38–126)
Anion gap: 9 (ref 5–15)
BUN: 14 mg/dL (ref 8–23)
CO2: 27 mmol/L (ref 22–32)
Calcium: 9.4 mg/dL (ref 8.9–10.3)
Chloride: 105 mmol/L (ref 98–111)
Creatinine: 1.37 mg/dL — ABNORMAL HIGH (ref 0.61–1.24)
GFR, Estimated: 59 mL/min — ABNORMAL LOW (ref >60)
Glucose, Bld: 114 mg/dL — ABNORMAL HIGH (ref 70–99)
Potassium: 4.1 mmol/L (ref 3.5–5.1)
Sodium: 141 mmol/L (ref 135–145)
Total Bilirubin: 1 mg/dL (ref 0.3–1.2)
Total Protein: 7.2 g/dL (ref 6.5–8.1)

## 2021-12-18 LAB — IRON AND IRON BINDING CAPACITY (CC-WL,HP ONLY)
Iron: 35 ug/dL — ABNORMAL LOW (ref 45–182)
Saturation Ratios: 10 % — ABNORMAL LOW (ref 17.9–39.5)
TIBC: 363 ug/dL (ref 250–450)
UIBC: 328 ug/dL (ref 117–376)

## 2021-12-18 LAB — FERRITIN: Ferritin: 9 ng/mL — ABNORMAL LOW (ref 24–336)

## 2021-12-18 MED ORDER — SODIUM CHLORIDE 0.9 % IV SOLN: Freq: Once | INTRAVENOUS | Status: AC

## 2021-12-18 NOTE — Progress Notes (Signed)
Hematology and Oncology Follow Up Visit  John Conner [id number] [birth year] 61 y.o. 12/18/2021   Principle Diagnosis:  JAK 2 positive   Current Therapy:   Phlebotomy to maintain Hct < 45%   Interim History:  John Conner is here today for follow-up. He is doing quite well and has no complaints at this time.  He is doing well on Eliquis for atrial fib. No blood loss, bruising or petechiae.  Hct is improved at 54%. He has tolerated phlebotomies nicely so far.  No fever, chills, n/v, cough, rash, dizziness, SOB, chest pain, palpitations, abdominal pain or changes in bowel or bladder habits.  No swelling, tenderness in his extremities.  Neuropathy in his lower extremities unchanged from baseline.  No falls or syncope reported. He uses his Rolator when ambulating for added support.  Appetite and hydration are good. Weight is stable at 330 lbs.   ECOG Performance Status: 1 - Symptomatic but completely ambulatory  Medications:  Allergies as of 12/18/2021       Reactions   Diphenhydramine Hcl Swelling   Lips swell   Lisinopril Cough        Medication List        Accurate as of December 18, 2021 10:12 AM. If you have any questions, ask your nurse or doctor.          amiodarone 200 MG tablet Commonly known as: PACERONE Take 1 tablet (200 mg total) by mouth daily. Take 2 tablets (400 mg) twice daily for two weeks, then 1 tablet (200 mg) twice daily for two weeks. Then take 1 tablet (200 mg) daily there after.   apixaban 5 MG Tabs tablet Commonly known as: ELIQUIS Take 1 tablet (5 mg total) by mouth 2 (two) times daily.   atorvastatin 80 MG tablet Commonly known as: LIPITOR Take 1 tablet (80 mg total) by mouth daily.   carvedilol 12.5 MG tablet Commonly known as: COREG Take 1 tablet (12.5 mg total) by mouth 2 (two) times daily with a meal.   diclofenac Sodium 1 % Gel Commonly known as: VOLTAREN Apply 2 g topically 4 (four) times daily.   Entresto 49-51 MG Generic  drug: sacubitril-valsartan Take 1 tablet by mouth 2 (two) times daily.   fluticasone 50 MCG/ACT nasal spray Commonly known as: FLONASE Place 1 spray into both nostrils as needed for allergies or rhinitis.   furosemide 40 MG tablet Commonly known as: LASIX TAKE 2 TABLETS (80 MG TOTAL) BY MOUTH EVERY MORNING AND 1 TABLET (40 MG TOTAL) EVERY EVENING.   Jardiance 10 MG Tabs tablet Generic drug: empagliflozin Take 1 tablet (10 mg total) by mouth daily.   metFORMIN 500 MG tablet Commonly known as: GLUCOPHAGE Take 500 mg by mouth 2 (two) times daily with a meal.   potassium chloride SA 20 MEQ tablet Commonly known as: KLOR-CON M Take 1 tablet (20 mEq total) by mouth daily.   spironolactone 25 MG tablet Commonly known as: ALDACTONE TAKE 1/2 TABLET BY MOUTH EVERY DAY   Vitamin D3 10 MCG (400 UNIT) tablet Take 400 Units by mouth daily.        Allergies:  Allergies  Allergen Reactions   Diphenhydramine Hcl Swelling    Lips swell     Lisinopril Cough    Past Medical History, Surgical history, Social history, and Family History were reviewed and updated.  Review of Systems: All other 10 point review of systems is negative.   Physical Exam:  weight is 330 lb 1.3 oz (149.7 kg) (  abnormal). His oral temperature is 98 F (36.7 C). His blood pressure is 126/83 and his pulse is 61. His respiration is 18 and oxygen saturation is 99%.   Wt Readings from Last 3 Encounters:  12/18/21 (!) 330 lb 1.3 oz (149.7 kg)  12/14/21 (!) 329 lb 6.4 oz (149.4 kg)  11/12/21 (!) 336 lb 1.3 oz (152.4 kg)    Ocular: Sclerae unicteric, pupils equal, round and reactive to light Ear-nose-throat: Oropharynx clear, dentition fair Lymphatic: No cervical or supraclavicular adenopathy Lungs no rales or rhonchi, good excursion bilaterally Heart regular rate and rhythm, no murmur appreciated Abd soft, nontender, positive bowel sounds MSK no focal spinal tenderness, no joint edema Neuro: non-focal,  well-oriented, appropriate affect Breasts: Deferred   Lab Results  Component Value Date   WBC 11.4 (H) 12/18/2021   HGB 16.7 12/18/2021   HCT 54.6 (H) 12/18/2021   MCV 81.7 12/18/2021   PLT 355 12/18/2021   Lab Results  Component Value Date   FERRITIN 30 11/12/2021   IRON 108 11/12/2021   TIBC 315 11/12/2021   UIBC 207 11/12/2021   IRONPCTSAT 34 11/12/2021   Lab Results  Component Value Date   RETICCTPCT 1.4 11/12/2021   RBC 6.68 (H) 12/18/2021   No results found for: "KPAFRELGTCHN", "LAMBDASER", "KAPLAMBRATIO" No results found for: "IGGSERUM", "IGA", "IGMSERUM" No results found for: "TOTALPROTELP", "ALBUMINELP", "A1GS", "A2GS", "BETS", "BETA2SER", "GAMS", "MSPIKE", "SPEI"   Chemistry      Component Value Date/Time   NA 139 11/12/2021 1015   NA 142 10/26/2021 1117   K 3.8 11/12/2021 1015   CL 103 11/12/2021 1015   CO2 27 11/12/2021 1015   BUN 13 11/12/2021 1015   BUN 11 10/26/2021 1117   CREATININE 1.11 11/12/2021 1015   CREATININE 1.11 09/09/2014 1017      Component Value Date/Time   CALCIUM 9.5 11/12/2021 1015   ALKPHOS 91 11/12/2021 1015   AST 22 11/12/2021 1015   ALT 30 11/12/2021 1015   BILITOT 1.3 (H) 11/12/2021 1015       Impression and Plan: John Conner is a very pleasant 61 yo African American gentleman with positive JAK 2.  Weekly phlebotomy x 3 and recheck labs every 4 weeks.  Follow-up in 2 months.   Lottie Dawson, NP 11/21/202310:12 AM

## 2021-12-18 NOTE — Telephone Encounter (Signed)
-----   Message from Bernestine Amass, RN sent at 12/14/2021  2:39 PM EST ----- Dr. Caryl Comes wants this patient to wear an event monitor in 3 months so I didn't order it yet, thought you may want to set yourself a reminder.  Thanks! Carly

## 2021-12-18 NOTE — Progress Notes (Signed)
John Conner presents today for phlebotomy per MD orders. Phlebotomy procedure started at 1040 and ended at 1135 500 grams removed. Patient observed for 30 minutes after procedure without any incident. Patient tolerated procedure well.  One liter of IVF's given per order of S. Eulas Post NP.  IV needle removed intact.

## 2021-12-18 NOTE — Patient Instructions (Addendum)
Therapeutic Phlebotomy Therapeutic phlebotomy is the planned removal of blood from a person's body for the purpose of treating a medical condition. The procedure is lot like donating blood. Usually, about a pint (470 mL, or 0.47 L) of blood is removed. The average adult has 9-12 pints (4.3-5.7 L) of blood in his or her body. Therapeutic phlebotomy may be used to treat the following medical conditions: Hemochromatosis. This is a condition in which the blood contains too much iron. Polycythemia vera. This is a condition in which the blood contains too many red blood cells. Porphyria cutanea tarda. This is a disease in which an important part of hemoglobin is not made properly. It results in the buildup of abnormal amounts of porphyrins in the body. Sickle cell disease. This is a condition in which the red blood cells form an abnormal crescent shape rather than a round shape. Tell a health care provider about: Any allergies you have. All medicines you are taking, including vitamins, herbs, eye drops, creams, and over-the-counter medicines. Any bleeding problems you have. Any surgeries you have had. Any medical conditions you have. Whether you are pregnant or may be pregnant. What are the risks? Generally, this is a safe procedure. However, problems may occur, including: Nausea or light-headedness. Low blood pressure (hypotension). Soreness, bleeding, swelling, or bruising at the needle insertion site. Infection. What happens before the procedure? Ask your health care provider about: Changing or stopping your regular medicines. This is especially important if you are taking diabetes medicines or blood thinners. Taking medicines such as aspirin and ibuprofen. These medicines can thin your blood. Do not take these medicines unless your health care provider tells you to take them. Taking over-the-counter medicines, vitamins, herbs, and supplements. Wear clothing with sleeves that can be raised  above the elbow. You may have a blood sample taken. Your blood pressure, pulse rate, and breathing rate will be measured. What happens during the procedure?  You may be given a medicine to numb the area (local anesthetic). A tourniquet will be placed on your arm. A needle will be put into one of your veins. Tubing and a collection bag will be attached to the needle. Blood will flow through the needle and tubing into the collection bag. The collection bag will be placed lower than your arm so gravity can help the blood flow into the bag. You may be asked to open and close your hand slowly and continually during the entire collection. After the specified amount of blood has been removed from your body, the collection bag and tubing will be clamped. The needle will be removed from your vein. Pressure will be held on the needle site to stop the bleeding. A bandage (dressing) will be placed over the needle insertion site. The procedure may vary among health care providers and hospitals. What happens after the procedure? Your blood pressure, pulse rate, and breathing rate will be measured after the procedure. You will be encouraged to drink fluids. You will be encouraged to eat a snack to prevent a low blood sugar level. Your recovery will be assessed and monitored. Return to your normal activities as told by your health care provider. Summary Therapeutic phlebotomy is the planned removal of blood from a person's body for the purpose of treating a medical condition. Therapeutic phlebotomy may be used to treat hemochromatosis, polycythemia vera, porphyria cutanea tarda, or sickle cell disease. In the procedure, a needle is inserted and about a pint (470 mL, or 0.47 L) of blood is   removed. The average adult has 9-12 pints (4.3-5.7 L) of blood in the body. This is generally a safe procedure, but it can sometimes cause problems such as nausea, light-headedness, or low blood pressure  (hypotension). This information is not intended to replace advice given to you by your health care provider. Make sure you discuss any questions you have with your health care provider. Document Revised: 07/12/2020 Document Reviewed: 07/12/2020 Elsevier Patient Education  [place]. Dehydration, Adult Dehydration is a condition in which there is not enough water or other fluids in the body. This happens when a person loses more fluids than he or she takes in. Important organs, such as the kidneys, brain, and heart, cannot function without a proper amount of fluids. Any loss of fluids from the body can lead to dehydration. Dehydration can be mild, moderate, or severe. It should be treated right away to prevent it from becoming severe. What are the causes? Dehydration may be caused by: Conditions that cause loss of water or other fluids, such as diarrhea, vomiting, or sweating or urinating a lot. Not drinking enough fluids, especially when you are ill or doing activities that require a lot of energy. Other illnesses and conditions, such as fever or infection. Certain medicines, such as medicines that remove excess fluid from the body (diuretics). Lack of safe drinking water. Not being able to get enough water and food. What increases the risk? The following factors may make you more likely to develop this condition: Having a long-term (chronic) illness that has not been treated properly, such as diabetes, heart disease, or kidney disease. Being 30 years of age or older. Having a disability. Living in a place that is high in altitude, where thinner, drier air causes more fluid loss. Doing exercises that put stress on your body for a long time (endurance sports). What are the signs or symptoms? Symptoms of dehydration depend on how severe it is. Mild or moderate dehydration Thirst. Dry lips or dry mouth. Dizziness or light-headedness, especially when standing up from a seated  position. Muscle cramps. Dark urine. Urine may be the color of tea. Less urine or tears produced than usual. Headache. Severe dehydration Changes in skin. Your skin may be cold and clammy, blotchy, or pale. Your skin also may not return to normal after being lightly pinched and released. Little or no tears, urine, or sweat. Changes in vital signs, such as rapid breathing and low blood pressure. Your pulse may be weak or may be faster than 100 beats a minute when you are sitting still. Other changes, such as: Feeling very thirsty. Sunken eyes. Cold hands and feet. Confusion. Being very tired (lethargic) or having trouble waking from sleep. Short-term weight loss. Loss of consciousness. How is this diagnosed? This condition is diagnosed based on your symptoms and a physical exam. You may have blood and urine tests to help confirm the diagnosis. How is this treated? Treatment for this condition depends on how severe it is. Treatment should be started right away. Do not wait until dehydration becomes severe. Severe dehydration is an emergency and needs to be treated in a hospital. Mild or moderate dehydration can be treated at home. You may be asked to: Drink more fluids. Drink an oral rehydration solution (ORS). This drink helps restore proper amounts of fluids and salts and minerals in the blood (electrolytes). Severe dehydration can be treated: With IV fluids. By correcting abnormal levels of electrolytes. This is often done by giving electrolytes through a tube  that is passed through your nose and into your stomach (nasogastric tube, or NG tube). By treating the underlying cause of dehydration. Follow these instructions at home: Oral rehydration solution If told by your health care provider, drink an ORS: Make an ORS by following instructions on the package. Start by drinking small amounts, about  cup (120 mL) every 5-10 minutes. Slowly increase how much you drink until you have  taken the amount recommended by your health care provider. Eating and drinking        Drink enough clear fluid to keep your urine pale yellow. If you were told to drink an ORS, finish the ORS first and then start slowly drinking other clear fluids. Drink fluids such as: Water. Do not drink only water. Doing that can lead to hyponatremia, which is having too little salt (sodium) in the body. Water from ice chips you suck on. Fruit juice that you have added water to (diluted fruit juice). Low-calorie sports drinks. Eat foods that contain a healthy balance of electrolytes, such as bananas, oranges, potatoes, tomatoes, and spinach. Do not drink alcohol. Avoid the following: Drinks that contain a lot of sugar. These include high-calorie sports drinks, fruit juice that is not diluted, and soda. Caffeine. Foods that are greasy or contain a lot of fat or sugar. General instructions Take over-the-counter and prescription medicines only as told by your health care provider. Do not take sodium tablets. Doing that can lead to having too much sodium in the body (hypernatremia). Return to your normal activities as told by your health care provider. Ask your health care provider what activities are safe for you. Keep all follow-up visits as told by your health care provider. This is important. Contact a health care provider if: You have muscle cramps, pain, or discomfort, such as: Pain in your abdomen and the pain gets worse or stays in one area (localizes). Stiff neck. You have a rash. You are more irritable than usual. You are sleepier or have a harder time waking than usual. You feel weak or dizzy. You feel very thirsty. Get help right away if you have: Any symptoms of severe dehydration. Symptoms of vomiting, such as: You cannot eat or drink without vomiting. Vomiting gets worse or does not go away. Vomit includes blood or green matter (bile). Symptoms that get worse with treatment. A  fever. A severe headache. Problems with urination or bowel movements, such as: Diarrhea that gets worse or does not go away. Blood in your stool (feces). This may cause stool to look black and tarry. Not urinating, or urinating only a small amount of very dark urine, within 6-8 hours. Trouble breathing. These symptoms may represent a serious problem that is an emergency. Do not wait to see if the symptoms will go away. Get medical help right away. Call your local emergency services (911 in the [place] Do not drive yourself to the hospital. Summary Dehydration is a condition in which there is not enough water or other fluids in the body. This happens when a person loses more fluids than he or she takes in. Treatment for this condition depends on how severe it is. Treatment should be started right away. Do not wait until dehydration becomes severe. Drink enough clear fluid to keep your urine pale yellow. If you were told to drink an oral rehydration solution (ORS), finish the ORS first and then start slowly drinking other clear fluids. Take over-the-counter and prescription medicines only as told by your health care provider.  Get help right away if you have any symptoms of severe dehydration. This information is not intended to replace advice given to you by your health care provider. Make sure you discuss any questions you have with your health care provider. Document Revised: 05/23/2021 Document Reviewed: 08/27/2018 Elsevier Patient Education  [place].

## 2021-12-21 ENCOUNTER — Other Ambulatory Visit (HOSPITAL_COMMUNITY): Payer: Self-pay

## 2021-12-24 ENCOUNTER — Inpatient Hospital Stay: Payer: No Typology Code available for payment source

## 2021-12-24 DIAGNOSIS — D500 Iron deficiency anemia secondary to blood loss (chronic): Secondary | ICD-10-CM

## 2021-12-24 DIAGNOSIS — D45 Polycythemia vera: Secondary | ICD-10-CM | POA: Diagnosis not present

## 2021-12-24 DIAGNOSIS — Z1589 Genetic susceptibility to other disease: Secondary | ICD-10-CM

## 2021-12-24 LAB — IRON AND IRON BINDING CAPACITY (CC-WL,HP ONLY)
Iron: 34 ug/dL — ABNORMAL LOW (ref 45–182)
Saturation Ratios: 9 % — ABNORMAL LOW (ref 17.9–39.5)
TIBC: 399 ug/dL (ref 250–450)
UIBC: 365 ug/dL (ref 117–376)

## 2021-12-24 LAB — CBC WITH DIFFERENTIAL (CANCER CENTER ONLY)
Abs Immature Granulocytes: 0.03 10*3/uL (ref 0.00–0.07)
Basophils Absolute: 0.1 10*3/uL (ref 0.0–0.1)
Basophils Relative: 1 %
Eosinophils Absolute: 0.4 10*3/uL (ref 0.0–0.5)
Eosinophils Relative: 3 %
HCT: 53 % — ABNORMAL HIGH (ref 39.0–52.0)
Hemoglobin: 16.4 g/dL (ref 13.0–17.0)
Immature Granulocytes: 0 %
Lymphocytes Relative: 36 %
Lymphs Abs: 4.2 10*3/uL — ABNORMAL HIGH (ref 0.7–4.0)
MCH: 25.1 pg — ABNORMAL LOW (ref 26.0–34.0)
MCHC: 30.9 g/dL (ref 30.0–36.0)
MCV: 81 fL (ref 80.0–100.0)
Monocytes Absolute: 0.8 10*3/uL (ref 0.1–1.0)
Monocytes Relative: 7 %
Neutro Abs: 6.2 10*3/uL (ref 1.7–7.7)
Neutrophils Relative %: 53 %
Platelet Count: 362 10*3/uL (ref 150–400)
RBC: 6.54 MIL/uL — ABNORMAL HIGH (ref 4.22–5.81)
RDW: 16.9 % — ABNORMAL HIGH (ref 11.5–15.5)
WBC Count: 11.8 10*3/uL — ABNORMAL HIGH (ref 4.0–10.5)
nRBC: 0 % (ref 0.0–0.2)

## 2021-12-24 LAB — CMP (CANCER CENTER ONLY)
ALT: 23 U/L (ref 0–44)
AST: 21 U/L (ref 15–41)
Albumin: 4.6 g/dL (ref 3.5–5.0)
Alkaline Phosphatase: 92 U/L (ref 38–126)
Anion gap: 9 (ref 5–15)
BUN: 14 mg/dL (ref 8–23)
CO2: 29 mmol/L (ref 22–32)
Calcium: 9.8 mg/dL (ref 8.9–10.3)
Chloride: 104 mmol/L (ref 98–111)
Creatinine: 1.5 mg/dL — ABNORMAL HIGH (ref 0.61–1.24)
GFR, Estimated: 53 mL/min — ABNORMAL LOW (ref >60)
Glucose, Bld: 99 mg/dL (ref 70–99)
Potassium: 4.1 mmol/L (ref 3.5–5.1)
Sodium: 142 mmol/L (ref 135–145)
Total Bilirubin: 1.2 mg/dL (ref 0.3–1.2)
Total Protein: 7.3 g/dL (ref 6.5–8.1)

## 2021-12-24 LAB — FERRITIN: Ferritin: 9 ng/mL — ABNORMAL LOW (ref 24–336)

## 2021-12-24 NOTE — Patient Instructions (Signed)

## 2021-12-24 NOTE — Progress Notes (Signed)
John Conner presents today for phlebotomy per MD orders. Phlebotomy procedure started at 0920and ended at 0945. 500 grams removed. Had to use 2-60 cc syringes at the end to make 500 grams. Patient observed for 30 minutes after procedure without any incident. Patient tolerated procedure well. IV needle removed intact.

## 2021-12-31 ENCOUNTER — Inpatient Hospital Stay: Payer: No Typology Code available for payment source

## 2022-01-01 ENCOUNTER — Inpatient Hospital Stay: Payer: No Typology Code available for payment source

## 2022-01-01 ENCOUNTER — Inpatient Hospital Stay: Payer: No Typology Code available for payment source | Attending: Hematology & Oncology

## 2022-01-01 VITALS — BP 100/66 | HR 57 | Temp 97.6°F | Resp 18

## 2022-01-01 DIAGNOSIS — Z87891 Personal history of nicotine dependence: Secondary | ICD-10-CM | POA: Insufficient documentation

## 2022-01-01 DIAGNOSIS — D45 Polycythemia vera: Secondary | ICD-10-CM | POA: Insufficient documentation

## 2022-01-01 DIAGNOSIS — D751 Secondary polycythemia: Secondary | ICD-10-CM

## 2022-01-01 DIAGNOSIS — Z7901 Long term (current) use of anticoagulants: Secondary | ICD-10-CM | POA: Diagnosis not present

## 2022-01-01 DIAGNOSIS — E1140 Type 2 diabetes mellitus with diabetic neuropathy, unspecified: Secondary | ICD-10-CM | POA: Insufficient documentation

## 2022-01-01 DIAGNOSIS — Z7984 Long term (current) use of oral hypoglycemic drugs: Secondary | ICD-10-CM | POA: Diagnosis not present

## 2022-01-01 DIAGNOSIS — D500 Iron deficiency anemia secondary to blood loss (chronic): Secondary | ICD-10-CM

## 2022-01-01 DIAGNOSIS — I4891 Unspecified atrial fibrillation: Secondary | ICD-10-CM | POA: Diagnosis not present

## 2022-01-01 DIAGNOSIS — G4730 Sleep apnea, unspecified: Secondary | ICD-10-CM | POA: Diagnosis not present

## 2022-01-01 DIAGNOSIS — Z1589 Genetic susceptibility to other disease: Secondary | ICD-10-CM

## 2022-01-01 LAB — CBC WITH DIFFERENTIAL (CANCER CENTER ONLY)
Abs Immature Granulocytes: 0.06 10*3/uL (ref 0.00–0.07)
Basophils Absolute: 0.1 10*3/uL (ref 0.0–0.1)
Basophils Relative: 1 %
Eosinophils Absolute: 0.4 10*3/uL (ref 0.0–0.5)
Eosinophils Relative: 3 %
HCT: 49.4 % (ref 39.0–52.0)
Hemoglobin: 15.2 g/dL (ref 13.0–17.0)
Immature Granulocytes: 1 %
Lymphocytes Relative: 37 %
Lymphs Abs: 4.3 10*3/uL — ABNORMAL HIGH (ref 0.7–4.0)
MCH: 25.1 pg — ABNORMAL LOW (ref 26.0–34.0)
MCHC: 30.8 g/dL (ref 30.0–36.0)
MCV: 81.5 fL (ref 80.0–100.0)
Monocytes Absolute: 0.7 10*3/uL (ref 0.1–1.0)
Monocytes Relative: 6 %
Neutro Abs: 6.2 10*3/uL (ref 1.7–7.7)
Neutrophils Relative %: 52 %
Platelet Count: 396 10*3/uL (ref 150–400)
RBC: 6.06 MIL/uL — ABNORMAL HIGH (ref 4.22–5.81)
RDW: 16.1 % — ABNORMAL HIGH (ref 11.5–15.5)
WBC Count: 11.7 10*3/uL — ABNORMAL HIGH (ref 4.0–10.5)
nRBC: 0 % (ref 0.0–0.2)

## 2022-01-01 LAB — IRON AND IRON BINDING CAPACITY (CC-WL,HP ONLY)
Iron: 21 ug/dL — ABNORMAL LOW (ref 45–182)
Saturation Ratios: 6 % — ABNORMAL LOW (ref 17.9–39.5)
TIBC: 361 ug/dL (ref 250–450)
UIBC: 340 ug/dL (ref 117–376)

## 2022-01-01 LAB — CMP (CANCER CENTER ONLY)
ALT: 18 U/L (ref 0–44)
AST: 18 U/L (ref 15–41)
Albumin: 4.4 g/dL (ref 3.5–5.0)
Alkaline Phosphatase: 84 U/L (ref 38–126)
Anion gap: 8 (ref 5–15)
BUN: 16 mg/dL (ref 8–23)
CO2: 28 mmol/L (ref 22–32)
Calcium: 9 mg/dL (ref 8.9–10.3)
Chloride: 103 mmol/L (ref 98–111)
Creatinine: 1.37 mg/dL — ABNORMAL HIGH (ref 0.61–1.24)
GFR, Estimated: 59 mL/min — ABNORMAL LOW (ref >60)
Glucose, Bld: 110 mg/dL — ABNORMAL HIGH (ref 70–99)
Potassium: 4.2 mmol/L (ref 3.5–5.1)
Sodium: 139 mmol/L (ref 135–145)
Total Bilirubin: 0.7 mg/dL (ref 0.3–1.2)
Total Protein: 7.3 g/dL (ref 6.5–8.1)

## 2022-01-01 LAB — FERRITIN: Ferritin: 10 ng/mL — ABNORMAL LOW (ref 24–336)

## 2022-01-01 MED ORDER — SODIUM CHLORIDE 0.9 % IV SOLN: Freq: Once | INTRAVENOUS | Status: AC

## 2022-01-01 NOTE — Progress Notes (Signed)
1 unit therapeutic phlebotomy performed over 20 minutes using a 20 gauge IV to the right hand. Patient tolerated well. Nourishment provided. IV fluids administered post phlebotomy as ordered.

## 2022-01-01 NOTE — Patient Instructions (Signed)

## 2022-01-11 ENCOUNTER — Other Ambulatory Visit: Payer: Self-pay | Admitting: Family

## 2022-01-11 DIAGNOSIS — D500 Iron deficiency anemia secondary to blood loss (chronic): Secondary | ICD-10-CM

## 2022-01-11 DIAGNOSIS — Z1589 Genetic susceptibility to other disease: Secondary | ICD-10-CM

## 2022-01-14 ENCOUNTER — Inpatient Hospital Stay: Payer: No Typology Code available for payment source

## 2022-01-14 DIAGNOSIS — D45 Polycythemia vera: Secondary | ICD-10-CM | POA: Diagnosis not present

## 2022-01-14 DIAGNOSIS — D500 Iron deficiency anemia secondary to blood loss (chronic): Secondary | ICD-10-CM

## 2022-01-14 DIAGNOSIS — Z1589 Genetic susceptibility to other disease: Secondary | ICD-10-CM

## 2022-01-14 LAB — CBC WITH DIFFERENTIAL (CANCER CENTER ONLY)
Abs Immature Granulocytes: 0.03 10*3/uL (ref 0.00–0.07)
Basophils Absolute: 0.1 10*3/uL (ref 0.0–0.1)
Basophils Relative: 1 %
Eosinophils Absolute: 0.7 10*3/uL — ABNORMAL HIGH (ref 0.0–0.5)
Eosinophils Relative: 7 %
HCT: 46.8 % (ref 39.0–52.0)
Hemoglobin: 14.4 g/dL (ref 13.0–17.0)
Immature Granulocytes: 0 %
Lymphocytes Relative: 17 %
Lymphs Abs: 1.7 10*3/uL (ref 0.7–4.0)
MCH: 24.1 pg — ABNORMAL LOW (ref 26.0–34.0)
MCHC: 30.8 g/dL (ref 30.0–36.0)
MCV: 78.3 fL — ABNORMAL LOW (ref 80.0–100.0)
Monocytes Absolute: 1 10*3/uL (ref 0.1–1.0)
Monocytes Relative: 10 %
Neutro Abs: 6.5 10*3/uL (ref 1.7–7.7)
Neutrophils Relative %: 65 %
Platelet Count: 334 10*3/uL (ref 150–400)
RBC: 5.98 MIL/uL — ABNORMAL HIGH (ref 4.22–5.81)
RDW: 15.8 % — ABNORMAL HIGH (ref 11.5–15.5)
WBC Count: 10.1 10*3/uL (ref 4.0–10.5)
nRBC: 0 % (ref 0.0–0.2)

## 2022-01-14 LAB — CMP (CANCER CENTER ONLY)
ALT: 52 U/L — ABNORMAL HIGH (ref 0–44)
AST: 46 U/L — ABNORMAL HIGH (ref 15–41)
Albumin: 4.2 g/dL (ref 3.5–5.0)
Alkaline Phosphatase: 115 U/L (ref 38–126)
Anion gap: 10 (ref 5–15)
BUN: 15 mg/dL (ref 8–23)
CO2: 28 mmol/L (ref 22–32)
Calcium: 8.9 mg/dL (ref 8.9–10.3)
Chloride: 103 mmol/L (ref 98–111)
Creatinine: 1.44 mg/dL — ABNORMAL HIGH (ref 0.61–1.24)
GFR, Estimated: 55 mL/min — ABNORMAL LOW (ref >60)
Glucose, Bld: 139 mg/dL — ABNORMAL HIGH (ref 70–99)
Potassium: 3.5 mmol/L (ref 3.5–5.1)
Sodium: 141 mmol/L (ref 135–145)
Total Bilirubin: 0.7 mg/dL (ref 0.3–1.2)
Total Protein: 7.1 g/dL (ref 6.5–8.1)

## 2022-01-14 LAB — FERRITIN: Ferritin: 20 ng/mL — ABNORMAL LOW (ref 24–336)

## 2022-01-14 LAB — IRON AND IRON BINDING CAPACITY (CC-WL,HP ONLY)
Iron: 29 ug/dL — ABNORMAL LOW (ref 45–182)
Saturation Ratios: 8 % — ABNORMAL LOW (ref 17.9–39.5)
TIBC: 353 ug/dL (ref 250–450)
UIBC: 324 ug/dL (ref 117–376)

## 2022-01-25 ENCOUNTER — Telehealth: Payer: Self-pay

## 2022-01-25 ENCOUNTER — Ambulatory Visit: Payer: No Typology Code available for payment source | Attending: Internal Medicine

## 2022-01-25 DIAGNOSIS — I4891 Unspecified atrial fibrillation: Secondary | ICD-10-CM

## 2022-01-25 DIAGNOSIS — I493 Ventricular premature depolarization: Secondary | ICD-10-CM

## 2022-01-25 DIAGNOSIS — I428 Other cardiomyopathies: Secondary | ICD-10-CM

## 2022-01-25 LAB — COMPREHENSIVE METABOLIC PANEL
ALT: 69 IU/L — ABNORMAL HIGH (ref 0–44)
AST: 43 IU/L — ABNORMAL HIGH (ref 0–40)
Albumin/Globulin Ratio: 1.6 (ref 1.2–2.2)
Albumin: 4.2 g/dL (ref 3.9–4.9)
Alkaline Phosphatase: 191 IU/L — ABNORMAL HIGH (ref 44–121)
BUN/Creatinine Ratio: 10 (ref 10–24)
BUN: 13 mg/dL (ref 8–27)
Bilirubin Total: 0.5 mg/dL (ref 0.0–1.2)
CO2: 22 mmol/L (ref 20–29)
Calcium: 9.1 mg/dL (ref 8.6–10.2)
Chloride: 104 mmol/L (ref 96–106)
Creatinine, Ser: 1.29 mg/dL — ABNORMAL HIGH (ref 0.76–1.27)
Globulin, Total: 2.7 g/dL (ref 1.5–4.5)
Glucose: 105 mg/dL — ABNORMAL HIGH (ref 70–99)
Potassium: 4.4 mmol/L (ref 3.5–5.2)
Sodium: 142 mmol/L (ref 134–144)
Total Protein: 6.9 g/dL (ref 6.0–8.5)
eGFR: 63 mL/min/{1.73_m2} (ref >59)

## 2022-01-25 LAB — TSH: TSH: 1.4 u[IU]/mL (ref 0.450–4.500)

## 2022-01-25 NOTE — Telephone Encounter (Signed)
Called pt reviewed MD results for CMP drawn 01/25/22.  Pt expresses understanding will come in for repeat labs on 03/15/22.    Deboraha Sprang, MD 01/25/2022  5:37 PM EST Back to Top    Please Inform Patient   Labs are normal x there is an elevation in the transaminases from baseline but less than twice the upper limit of normal and an elevation in the alkaline phosphatase which is also less than twice the upper limits of normal which have been diagnostic threshold for hepatotoxicity from amiodarone.  What I would ask him is to we will repeated the blood work again in about 6 weeks and for now continue the amiodarone   Thanks

## 2022-02-06 ENCOUNTER — Emergency Department (HOSPITAL_BASED_OUTPATIENT_CLINIC_OR_DEPARTMENT_OTHER): Payer: Medicaid Other

## 2022-02-06 ENCOUNTER — Encounter (HOSPITAL_BASED_OUTPATIENT_CLINIC_OR_DEPARTMENT_OTHER): Payer: Self-pay | Admitting: Emergency Medicine

## 2022-02-06 ENCOUNTER — Emergency Department (HOSPITAL_BASED_OUTPATIENT_CLINIC_OR_DEPARTMENT_OTHER)
Admission: EM | Admit: 2022-02-06 | Discharge: 2022-02-06 | Disposition: A | Discharge status: Home / Self Care | Payer: Medicaid Other | Attending: Emergency Medicine | Admitting: Emergency Medicine

## 2022-02-06 ENCOUNTER — Other Ambulatory Visit: Payer: Self-pay

## 2022-02-06 DIAGNOSIS — I110 Hypertensive heart disease with heart failure: Secondary | ICD-10-CM | POA: Insufficient documentation

## 2022-02-06 DIAGNOSIS — R600 Localized edema: Secondary | ICD-10-CM | POA: Insufficient documentation

## 2022-02-06 DIAGNOSIS — Z7901 Long term (current) use of anticoagulants: Secondary | ICD-10-CM | POA: Insufficient documentation

## 2022-02-06 DIAGNOSIS — I4891 Unspecified atrial fibrillation: Secondary | ICD-10-CM | POA: Diagnosis not present

## 2022-02-06 DIAGNOSIS — I509 Heart failure, unspecified: Secondary | ICD-10-CM | POA: Diagnosis not present

## 2022-02-06 DIAGNOSIS — R0602 Shortness of breath: Secondary | ICD-10-CM | POA: Insufficient documentation

## 2022-02-06 DIAGNOSIS — I2510 Atherosclerotic heart disease of native coronary artery without angina pectoris: Secondary | ICD-10-CM | POA: Diagnosis not present

## 2022-02-06 DIAGNOSIS — Z79899 Other long term (current) drug therapy: Secondary | ICD-10-CM | POA: Diagnosis not present

## 2022-02-06 DIAGNOSIS — R0789 Other chest pain: Secondary | ICD-10-CM | POA: Insufficient documentation

## 2022-02-06 DIAGNOSIS — Z1152 Encounter for screening for COVID-19: Secondary | ICD-10-CM | POA: Insufficient documentation

## 2022-02-06 DIAGNOSIS — Z7984 Long term (current) use of oral hypoglycemic drugs: Secondary | ICD-10-CM | POA: Insufficient documentation

## 2022-02-06 LAB — TROPONIN I (HIGH SENSITIVITY)
Troponin I (High Sensitivity): 11 ng/L (ref <18)
Troponin I (High Sensitivity): 9 ng/L (ref <18)

## 2022-02-06 LAB — BASIC METABOLIC PANEL
Anion gap: 10 (ref 5–15)
BUN: 12 mg/dL (ref 8–23)
CO2: 24 mmol/L (ref 22–32)
Calcium: 8.9 mg/dL (ref 8.9–10.3)
Chloride: 104 mmol/L (ref 98–111)
Creatinine, Ser: 1.39 mg/dL — ABNORMAL HIGH (ref 0.61–1.24)
GFR, Estimated: 58 mL/min — ABNORMAL LOW (ref >60)
Glucose, Bld: 96 mg/dL (ref 70–99)
Potassium: 3.6 mmol/L (ref 3.5–5.1)
Sodium: 138 mmol/L (ref 135–145)

## 2022-02-06 LAB — CBC
HCT: 49.3 % (ref 39.0–52.0)
Hemoglobin: 14.8 g/dL (ref 13.0–17.0)
MCH: 22.9 pg — ABNORMAL LOW (ref 26.0–34.0)
MCHC: 30 g/dL (ref 30.0–36.0)
MCV: 76.3 fL — ABNORMAL LOW (ref 80.0–100.0)
Platelets: 457 10*3/uL — ABNORMAL HIGH (ref 150–400)
RBC: 6.46 MIL/uL — ABNORMAL HIGH (ref 4.22–5.81)
RDW: 17.5 % — ABNORMAL HIGH (ref 11.5–15.5)
WBC: 11.8 10*3/uL — ABNORMAL HIGH (ref 4.0–10.5)
nRBC: 0 % (ref 0.0–0.2)

## 2022-02-06 LAB — RESP PANEL BY RT-PCR (RSV, FLU A&B, COVID)  RVPGX2
Influenza A by PCR: NEGATIVE
Influenza B by PCR: NEGATIVE
Resp Syncytial Virus by PCR: NEGATIVE
SARS Coronavirus 2 by RT PCR: NEGATIVE

## 2022-02-06 NOTE — Discharge Instructions (Signed)
You were seen in the emergency department today for chest pain. All of your lab work is normal. Your EKG looks reassuring and unchanged and your chest x-ray looks normal. Please follow-up with your cardiologist to ensure an upcoming appointment. Please return to the emergency department for worsening symptoms.

## 2022-02-06 NOTE — ED Provider Notes (Signed)
[facility] EMERGENCY DEPARTMENT Provider Note   CSN: [id number] Arrival date & time: 02/06/22  1128     History  Chief Complaint  Patient presents with   Chest Pain    John Conner is a 62 y.o. male. With past medical history of hypertension, CHF, obesity, OSA, atrial fibrillation anticoagulated on Eliquis, NSTEMI who presents to the emergency department with chest pain.  States that pain began yesterday. He describes having two episodes of throbbing, left-sided, non radiating chest pain that lasted about 2-3 minutes. He states he was laying down when these episodes occurred. He denies aggravating or alleviating factors. He states this morning he briefly felt short of breath which subsided and has not returned. He denies having palpitations, lightheadedness, cough, fever. He has been taking his medications appropriately. 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 States his next appointment is 02/11/21.     Chest Pain Associated symptoms: shortness of breath        Home Medications Prior to Admission medications   Medication Sig Start Date End Date Taking? Authorizing Provider  amiodarone (PACERONE) 200 MG tablet Take 1 tablet (200 mg total) by mouth daily. Take 2 tablets (400 mg) twice daily for two weeks, then 1 tablet (200 mg) twice daily for two weeks. Then take 1 tablet (200 mg) daily there after. 12/14/21   Deboraha Sprang, MD  apixaban (ELIQUIS) 5 MG TABS tablet Take 1 tablet (5 mg total) by mouth 2 (two) times daily. 09/24/21   Bensimhon, Shaune Pascal, MD  atorvastatin (LIPITOR) 80 MG tablet Take 1 tablet (80 mg total) by mouth daily. 10/23/21   Lyda Jester M, PA-C  carvedilol (COREG) 12.5 MG tablet Take 1 tablet (12.5 mg total) by mouth 2 (two) times daily with a meal. 10/23/21   Consuelo Pandy, PA-C  Cholecalciferol (VITAMIN D3) 10 MCG (400 UNIT) tablet Take 400 Units by mouth  daily.    [provider]  diclofenac Sodium (VOLTAREN) 1 % GEL Apply 2 g topically 4 (four) times daily. 11/06/21   Angelique Blonder, DO  empagliflozin (JARDIANCE) 10 MG TABS tablet Take 1 tablet (10 mg total) by mouth daily. 04/04/21   Clegg, Amy D, NP  fluticasone (FLONASE) 50 MCG/ACT nasal spray Place 1 spray into both nostrils as needed for allergies or rhinitis.    [provider]  furosemide (LASIX) 40 MG tablet TAKE 2 TABLETS (80 MG TOTAL) BY MOUTH EVERY MORNING AND 1 TABLET (40 MG TOTAL) EVERY EVENING. 11/20/21   Clegg, Amy D, NP  metFORMIN (GLUCOPHAGE) 500 MG tablet Take 500 mg by mouth 2 (two) times daily with a meal.    [provider]  potassium chloride SA (KLOR-CON M) 20 MEQ tablet Take 1 tablet (20 mEq total) by mouth daily. 02/08/21   Domenic Polite, MD  sacubitril-valsartan (ENTRESTO) 49-51 MG Take 1 tablet by mouth 2 (two) times daily. 12/14/21   Deboraha Sprang, MD  spironolactone (ALDACTONE) 25 MG tablet TAKE 1/2 TABLET BY MOUTH EVERY DAY 10/08/21   Bensimhon, Shaune Pascal, MD      Allergies    Diphenhydramine hcl and Lisinopril    Review of Systems   Review of Systems  Respiratory:  Positive for shortness of breath.   Cardiovascular:  Positive for chest pain.  All other systems reviewed and are negative.   Physical Exam Updated Vital Signs BP 99/63 (BP Location: Right Arm)  Pulse 65   Temp 98.2 F (36.8 C) (Oral)   Resp 15   Ht '5\' 10"'$  (1.778 m)   Wt (!) 145.2 kg   SpO2 100%   BMI 45.92 kg/m  Physical Exam Vitals and nursing note reviewed.  Constitutional:      General: He is not in acute distress.    Appearance: Normal appearance. He is obese. He is not ill-appearing or toxic-appearing.  HENT:     Head: Normocephalic.  Eyes:     General: No scleral icterus.    Extraocular Movements: Extraocular movements intact.     Pupils: Pupils are equal, round, and reactive to light.  Neck:     Vascular: No JVD.  Cardiovascular:     Rate and  Rhythm: Normal rate and regular rhythm.     Pulses:          Radial pulses are 2+ on the right side and 2+ on the left side.     Heart sounds: Normal heart sounds. No murmur heard.    Comments: Trace bilateral lower extremity edema  Pulmonary:     Effort: Pulmonary effort is normal. No tachypnea or respiratory distress.     Breath sounds: Normal breath sounds. No decreased breath sounds, wheezing, rhonchi or rales.  Chest:     Chest wall: Tenderness present.  Abdominal:     General: Bowel sounds are normal.     Palpations: Abdomen is soft.  Musculoskeletal:     Cervical back: Normal range of motion.  Skin:    General: Skin is warm and dry.     Capillary Refill: Capillary refill takes less than 2 seconds.  Neurological:     General: No focal deficit present.     Mental Status: He is alert and oriented to person, place, and time.  Psychiatric:        Mood and Affect: Mood normal.        Behavior: Behavior normal.     ED Results / Procedures / Treatments   Labs (all labs ordered are listed, but only abnormal results are displayed) Labs Reviewed  BASIC METABOLIC PANEL - Abnormal; Notable for the following components:      Result Value   Creatinine, Ser 1.39 (*)    GFR, Estimated 58 (*)    All other components within normal limits  CBC - Abnormal; Notable for the following components:   WBC 11.8 (*)    RBC 6.46 (*)    MCV 76.3 (*)    MCH 22.9 (*)    RDW 17.5 (*)    Platelets 457 (*)    All other components within normal limits  RESP PANEL BY RT-PCR (RSV, FLU A&B, COVID)  [id number]  TROPONIN I (HIGH SENSITIVITY)  TROPONIN I (HIGH SENSITIVITY)    EKG EKG Interpretation  Date/Time:  Wednesday February 06 2022 11:45:33 EST Ventricular Rate:  61 PR Interval:  215 QRS Duration: 121 QT Interval:  424 QTC Calculation: 428 R Axis:   -51 Text Interpretation: Sinus rhythm Ventricular premature complex Borderline prolonged PR interval Nonspecific IVCD with LAD Inferior infarct,  old Lateral leads are also involved Baseline wander in lead(s) V3 V4 Confirmed by Campbell Stall ([id number]) on 1/44/8185 3:34:15 PM  Radiology DG Chest 2 View  Result Date: 02/06/2022 CLINICAL DATA:  Chest pain. EXAM: CHEST - 2 VIEW COMPARISON:  11/06/2021. FINDINGS: Cardiac silhouette normal in size. No mediastinal or hilar masses. No evidence of adenopathy. Clear lungs.  No pleural effusion or pneumothorax. Skeletal structures are  intact. IMPRESSION: No active cardiopulmonary disease. Electronically Signed   By: Lajean Manes M.D.   On: 02/06/2022 12:19    Procedures Procedures    Medications Ordered in ED Medications - No data to display  ED Course/ Medical Decision Making/ A&P                           Medical Decision Making Amount and/or Complexity of Data Reviewed Labs: ordered. Radiology: ordered.  Initial Impression and Ddx 61 year old male who presents to the emergency department with chest pain. His symptoms are reproducible on exam to palpation to the chest.  Patient PMH that increases complexity of ED encounter:  HTN, OSA, NSTEMI, CAD, CHF  Interpretation of Diagnostics I independent reviewed and interpreted the labs as followed: troponin x2 negative, covid/flu negative, cbc with erythrocytosis. Bmp with stable renal function   - I independently visualized the following imaging with scope of interpretation limited to determining acute life threatening conditions related to emergency care: chest x-ray, which revealed no acute findings  Patient Reassessment and Ultimate Disposition/Management EKG without ischemia or infarction, troponin x2 negative, doubt ACS  Does not appear fluid volume overloaded on exam, he has trace pitting edema in BLE but no fluid on CXR, no rales on exam, doubt CHF exacerbation. Will not obtain BNP at this time.  Considered but doubt PE. Cannot PERC out due to age. Wells low risk so will defer d-dimer or CTA PE study at this time. CXR without evidence  of pneumonia, pleural effusion or pneumothorax. No recent illnesses and troponin negative so doubt pericarditis or myocarditis  Symptoms inconsistent with aortic dissection  His chest pain is reproducible. Likely chest wall pain related to MSK.   Given strict return precautions given history. He verbalizes understanding. Will f/u with cardiology.   The patient has been appropriately medically screened and/or stabilized in the ED. I have low suspicion for any other emergent medical condition which would require further screening, evaluation or treatment in the ED or require inpatient management. At time of discharge the patient is hemodynamically stable and in no acute distress. I have discussed work-up results and diagnosis with patient and answered all questions. Patient is agreeable with discharge plan. We discussed strict return precautions for returning to the emergency department and they verbalized understanding.      Patient management required discussion with the following services or consulting groups:  None  Complexity of Problems Addressed Acute complicated illness or Injury  Additional Data Reviewed and Analyzed Further history obtained from: Further history from spouse/family member, Past medical history and medications listed in the EMR, Recent PCP notes, and Care Everywhere  Patient Encounter Risk Assessment Prescriptions  Final Clinical Impression(s) / ED Diagnoses Final diagnoses:  Chest wall pain    Rx / DC Orders ED Discharge Orders     None         Mickie Hillier, PA-C 09/62/83 [id number]    Gray, Miller P, DO 47/65/46 [phone]

## 2022-02-06 NOTE — ED Triage Notes (Signed)
Throbbing mid chest pain worse with movement and exertion , started yesterday . Also reports dry cough yesterday . Earache as well.

## 2022-02-06 NOTE — ED Notes (Signed)
Patient transported to X-ray 

## 2022-02-07 ENCOUNTER — Encounter: Payer: Self-pay | Admitting: Family

## 2022-02-11 ENCOUNTER — Inpatient Hospital Stay: Payer: Medicaid Other

## 2022-02-11 ENCOUNTER — Inpatient Hospital Stay: Payer: Medicaid Other | Admitting: Family

## 2022-02-12 ENCOUNTER — Encounter: Payer: Self-pay | Admitting: Family

## 2022-02-12 ENCOUNTER — Inpatient Hospital Stay (HOSPITAL_BASED_OUTPATIENT_CLINIC_OR_DEPARTMENT_OTHER): Payer: Medicaid Other | Admitting: Family

## 2022-02-12 ENCOUNTER — Inpatient Hospital Stay: Payer: Medicaid Other | Attending: Hematology & Oncology

## 2022-02-12 ENCOUNTER — Inpatient Hospital Stay: Payer: Medicaid Other

## 2022-02-12 ENCOUNTER — Other Ambulatory Visit: Payer: Self-pay | Admitting: Family

## 2022-02-12 VITALS — BP 113/72 | HR 63 | Temp 98.2°F | Resp 17 | Wt 316.8 lb

## 2022-02-12 VITALS — BP 94/65 | HR 59 | Resp 18

## 2022-02-12 DIAGNOSIS — D500 Iron deficiency anemia secondary to blood loss (chronic): Secondary | ICD-10-CM | POA: Diagnosis not present

## 2022-02-12 DIAGNOSIS — D45 Polycythemia vera: Secondary | ICD-10-CM | POA: Diagnosis present

## 2022-02-12 DIAGNOSIS — E1140 Type 2 diabetes mellitus with diabetic neuropathy, unspecified: Secondary | ICD-10-CM | POA: Diagnosis not present

## 2022-02-12 DIAGNOSIS — Z1589 Genetic susceptibility to other disease: Secondary | ICD-10-CM

## 2022-02-12 DIAGNOSIS — Z7984 Long term (current) use of oral hypoglycemic drugs: Secondary | ICD-10-CM | POA: Diagnosis not present

## 2022-02-12 DIAGNOSIS — G4730 Sleep apnea, unspecified: Secondary | ICD-10-CM | POA: Diagnosis not present

## 2022-02-12 DIAGNOSIS — Z87891 Personal history of nicotine dependence: Secondary | ICD-10-CM | POA: Diagnosis not present

## 2022-02-12 DIAGNOSIS — D751 Secondary polycythemia: Secondary | ICD-10-CM

## 2022-02-12 DIAGNOSIS — I4891 Unspecified atrial fibrillation: Secondary | ICD-10-CM | POA: Diagnosis not present

## 2022-02-12 DIAGNOSIS — Z7901 Long term (current) use of anticoagulants: Secondary | ICD-10-CM | POA: Insufficient documentation

## 2022-02-12 LAB — FERRITIN: Ferritin: 8 ng/mL — ABNORMAL LOW (ref 24–336)

## 2022-02-12 LAB — CBC WITH DIFFERENTIAL (CANCER CENTER ONLY)
Abs Immature Granulocytes: 0.04 10*3/uL (ref 0.00–0.07)
Basophils Absolute: 0.1 10*3/uL (ref 0.0–0.1)
Basophils Relative: 1 %
Eosinophils Absolute: 0.3 10*3/uL (ref 0.0–0.5)
Eosinophils Relative: 2 %
HCT: 49.4 % (ref 39.0–52.0)
Hemoglobin: 14.7 g/dL (ref 13.0–17.0)
Immature Granulocytes: 0 %
Lymphocytes Relative: 37 %
Lymphs Abs: 5 10*3/uL — ABNORMAL HIGH (ref 0.7–4.0)
MCH: 22.5 pg — ABNORMAL LOW (ref 26.0–34.0)
MCHC: 29.8 g/dL — ABNORMAL LOW (ref 30.0–36.0)
MCV: 75.5 fL — ABNORMAL LOW (ref 80.0–100.0)
Monocytes Absolute: 1 10*3/uL (ref 0.1–1.0)
Monocytes Relative: 7 %
Neutro Abs: 7.1 10*3/uL (ref 1.7–7.7)
Neutrophils Relative %: 53 %
Platelet Count: 404 10*3/uL — ABNORMAL HIGH (ref 150–400)
RBC: 6.54 MIL/uL — ABNORMAL HIGH (ref 4.22–5.81)
RDW: 17.5 % — ABNORMAL HIGH (ref 11.5–15.5)
WBC Count: 13.5 10*3/uL — ABNORMAL HIGH (ref 4.0–10.5)
nRBC: 0 % (ref 0.0–0.2)

## 2022-02-12 LAB — CMP (CANCER CENTER ONLY)
ALT: 27 U/L (ref 0–44)
AST: 30 U/L (ref 15–41)
Albumin: 4 g/dL (ref 3.5–5.0)
Alkaline Phosphatase: 124 U/L (ref 38–126)
Anion gap: 11 (ref 5–15)
BUN: 14 mg/dL (ref 8–23)
CO2: 23 mmol/L (ref 22–32)
Calcium: 8.8 mg/dL — ABNORMAL LOW (ref 8.9–10.3)
Chloride: 102 mmol/L (ref 98–111)
Creatinine: 1.35 mg/dL — ABNORMAL HIGH (ref 0.61–1.24)
GFR, Estimated: 60 mL/min — ABNORMAL LOW (ref >60)
Glucose, Bld: 94 mg/dL (ref 70–99)
Potassium: 3.9 mmol/L (ref 3.5–5.1)
Sodium: 136 mmol/L (ref 135–145)
Total Bilirubin: 0.4 mg/dL (ref 0.3–1.2)
Total Protein: 7.7 g/dL (ref 6.5–8.1)

## 2022-02-12 MED ORDER — SODIUM CHLORIDE 0.9 % IV SOLN: Freq: Once | INTRAVENOUS | Status: AC

## 2022-02-12 NOTE — Progress Notes (Signed)
John Conner presents today for phlebotomy per MD orders. Phlebotomy procedure started at 1425 with 20 gauge angiocath in right antecubital and ended at 1509. 502 grams removed. 5000 cc normal saline started.    Patient observed for 30 minutes after procedure without any incident. Patient tolerated procedure well. IV needle removed intact.

## 2022-02-12 NOTE — Progress Notes (Signed)
Hematology and Oncology Follow Up Visit  John Conner [id number] May 02, 1960 62 y.o. 02/12/2022   Principle Diagnosis:  JAK 2 positive    Current Therapy:        Phlebotomy to maintain Hct < 45%   Interim History:  John Conner is here today for follow-up. He is doing well and has no complaints at this time.  He has tolerated phlebotomy and replacement fluids well.  Hct today is 49%.  He has not noted any fever, chills, n/v, cough, rash, dizziness, SOB, chest pain, palpitations, abdominal pain or changes in bowel or bladder habits.  No swelling, tenderness, numbness or tingling in his extremities at this time.  No falls or syncope reported.  No blood loss, bruising or petechiae.  Appetite and hydration have been good. Weight is stable at 316 lbs.   ECOG Performance Status: 0 - Asymptomatic  Medications:  Allergies as of 02/12/2022       Reactions   Diphenhydramine Hcl Swelling   Lips swell   Lisinopril Cough        Medication List        Accurate as of February 12, 2022  2:14 PM. If you have any questions, ask your nurse or doctor.          amiodarone 200 MG tablet Commonly known as: PACERONE Take 1 tablet (200 mg total) by mouth daily. Take 2 tablets (400 mg) twice daily for two weeks, then 1 tablet (200 mg) twice daily for two weeks. Then take 1 tablet (200 mg) daily there after.   apixaban 5 MG Tabs tablet Commonly known as: ELIQUIS Take 1 tablet (5 mg total) by mouth 2 (two) times daily.   atorvastatin 80 MG tablet Commonly known as: LIPITOR Take 1 tablet (80 mg total) by mouth daily.   carvedilol 12.5 MG tablet Commonly known as: COREG Take 1 tablet (12.5 mg total) by mouth 2 (two) times daily with a meal.   diclofenac Sodium 1 % Gel Commonly known as: VOLTAREN Apply 2 g topically 4 (four) times daily.   Entresto 49-51 MG Generic drug: sacubitril-valsartan Take 1 tablet by mouth 2 (two) times daily.   fluticasone 50 MCG/ACT nasal spray Commonly  known as: FLONASE Place 1 spray into both nostrils as needed for allergies or rhinitis.   furosemide 40 MG tablet Commonly known as: LASIX TAKE 2 TABLETS (80 MG TOTAL) BY MOUTH EVERY MORNING AND 1 TABLET (40 MG TOTAL) EVERY EVENING.   Jardiance 10 MG Tabs tablet Generic drug: empagliflozin Take 1 tablet (10 mg total) by mouth daily.   metFORMIN 500 MG tablet Commonly known as: GLUCOPHAGE Take 500 mg by mouth 2 (two) times daily with a meal.   potassium chloride SA 20 MEQ tablet Commonly known as: KLOR-CON M Take 1 tablet (20 mEq total) by mouth daily.   spironolactone 25 MG tablet Commonly known as: ALDACTONE TAKE 1/2 TABLET BY MOUTH EVERY DAY   Vitamin D3 10 MCG (400 UNIT) tablet Take 400 Units by mouth daily.        Allergies:  Allergies  Allergen Reactions   Diphenhydramine Hcl Swelling    Lips swell     Lisinopril Cough    Past Medical History, Surgical history, Social history, and Family History were reviewed and updated.  Review of Systems: All other 10 point review of systems is negative.   Physical Exam:  weight is 316 lb 12.8 oz (143.7 kg) (abnormal). His oral temperature is 98.2 F (36.8 C). His blood pressure  is 113/72 and his pulse is 63. His respiration is 17 and oxygen saturation is 96%.   Wt Readings from Last 3 Encounters:  02/12/22 (!) 316 lb 12.8 oz (143.7 kg)  02/06/22 (!) 320 lb (145.2 kg)  12/18/21 (!) 330 lb 1.3 oz (149.7 kg)    Ocular: Sclerae unicteric, pupils equal, round and reactive to light Ear-nose-throat: Oropharynx clear, dentition fair Lymphatic: No cervical or supraclavicular adenopathy Lungs no rales or rhonchi, good excursion bilaterally Heart regular rate and rhythm, no murmur appreciated Abd soft, nontender, positive bowel sounds MSK no focal spinal tenderness, no joint edema Neuro: non-focal, well-oriented, appropriate affect Breasts: Deferred   Lab Results  Component Value Date   WBC 13.5 (H) 02/12/2022   HGB  14.7 02/12/2022   HCT 49.4 02/12/2022   MCV 75.5 (L) 02/12/2022   PLT 404 (H) 02/12/2022   Lab Results  Component Value Date   FERRITIN 20 (L) 01/14/2022   IRON 29 (L) 01/14/2022   TIBC 353 01/14/2022   UIBC 324 01/14/2022   IRONPCTSAT 8 (L) 01/14/2022   Lab Results  Component Value Date   RETICCTPCT 1.4 11/12/2021   RBC 6.54 (H) 02/12/2022   No results found for: "KPAFRELGTCHN", "LAMBDASER", "KAPLAMBRATIO" No results found for: "IGGSERUM", "IGA", "IGMSERUM" No results found for: "TOTALPROTELP", "ALBUMINELP", "A1GS", "A2GS", "BETS", "BETA2SER", "GAMS", "MSPIKE", "SPEI"   Chemistry      Component Value Date/Time   NA 136 02/12/2022 1342   NA 142 01/25/2022 0908   K 3.9 02/12/2022 1342   CL 102 02/12/2022 1342   CO2 23 02/12/2022 1342   BUN 14 02/12/2022 1342   BUN 13 01/25/2022 0908   CREATININE 1.35 (H) 02/12/2022 1342   CREATININE 1.11 09/09/2014 1017      Component Value Date/Time   CALCIUM 8.8 (L) 02/12/2022 1342   ALKPHOS 124 02/12/2022 1342   AST 30 02/12/2022 1342   ALT 27 02/12/2022 1342   BILITOT 0.4 02/12/2022 1342       Impression and Plan: John Conner is a very pleasant 62 yo African American gentleman with positive JAK 2.  Weekly phlebotomy x 2 and recheck labs every 4 weeks.  Follow-up in 2 months.   Lottie Dawson, NP 1/16/20242:14 PM

## 2022-02-12 NOTE — Patient Instructions (Signed)

## 2022-02-13 LAB — IRON AND IRON BINDING CAPACITY (CC-WL,HP ONLY)
Iron: 25 ug/dL — ABNORMAL LOW (ref 45–182)
Saturation Ratios: 6 % — ABNORMAL LOW (ref 17.9–39.5)
TIBC: 395 ug/dL (ref 250–450)
UIBC: 370 ug/dL (ref 117–376)

## 2022-02-18 ENCOUNTER — Encounter: Payer: Self-pay | Admitting: Family

## 2022-02-18 ENCOUNTER — Inpatient Hospital Stay: Payer: Medicaid Other

## 2022-02-18 VITALS — BP 129/75 | HR 61 | Temp 98.2°F | Resp 20

## 2022-02-18 DIAGNOSIS — D751 Secondary polycythemia: Secondary | ICD-10-CM

## 2022-02-18 DIAGNOSIS — D45 Polycythemia vera: Secondary | ICD-10-CM | POA: Diagnosis not present

## 2022-02-18 MED ORDER — SODIUM CHLORIDE 0.9 % IV SOLN: Freq: Once | INTRAVENOUS | Status: AC

## 2022-02-18 NOTE — Patient Instructions (Signed)

## 2022-02-18 NOTE — Progress Notes (Signed)
John Conner presents today for phlebotomy per MD orders. Phlebotomy procedure started at 0943 via 20 gauge angio-cath to right ac per Thane Edu RN and ended at 1040. 525 grams removed.  500 ml of NS given IV at end of phlebotomy with no difficulties. Patient observed for 30 minutes after procedure without any incident. Patient tolerated procedure well. IV needle removed intact.

## 2022-02-19 ENCOUNTER — Other Ambulatory Visit (HOSPITAL_COMMUNITY): Payer: Self-pay | Admitting: Cardiology

## 2022-03-12 ENCOUNTER — Other Ambulatory Visit: Payer: Self-pay

## 2022-03-12 ENCOUNTER — Emergency Department (HOSPITAL_BASED_OUTPATIENT_CLINIC_OR_DEPARTMENT_OTHER): Payer: No Typology Code available for payment source

## 2022-03-12 ENCOUNTER — Emergency Department (HOSPITAL_BASED_OUTPATIENT_CLINIC_OR_DEPARTMENT_OTHER)
Admission: EM | Admit: 2022-03-12 | Discharge: 2022-03-12 | Disposition: A | Discharge status: Home / Self Care | Payer: No Typology Code available for payment source | Attending: Emergency Medicine | Admitting: Emergency Medicine

## 2022-03-12 ENCOUNTER — Encounter: Payer: Self-pay | Admitting: Family

## 2022-03-12 ENCOUNTER — Inpatient Hospital Stay: Payer: Medicaid Other | Admitting: Family

## 2022-03-12 ENCOUNTER — Inpatient Hospital Stay: Payer: Medicaid Other

## 2022-03-12 DIAGNOSIS — M79605 Pain in left leg: Secondary | ICD-10-CM | POA: Insufficient documentation

## 2022-03-12 DIAGNOSIS — R531 Weakness: Secondary | ICD-10-CM | POA: Diagnosis present

## 2022-03-12 DIAGNOSIS — E119 Type 2 diabetes mellitus without complications: Secondary | ICD-10-CM | POA: Insufficient documentation

## 2022-03-12 DIAGNOSIS — I509 Heart failure, unspecified: Secondary | ICD-10-CM | POA: Insufficient documentation

## 2022-03-12 DIAGNOSIS — Z7984 Long term (current) use of oral hypoglycemic drugs: Secondary | ICD-10-CM | POA: Insufficient documentation

## 2022-03-12 DIAGNOSIS — R0981 Nasal congestion: Secondary | ICD-10-CM | POA: Diagnosis not present

## 2022-03-12 DIAGNOSIS — R42 Dizziness and giddiness: Secondary | ICD-10-CM | POA: Insufficient documentation

## 2022-03-12 DIAGNOSIS — R059 Cough, unspecified: Secondary | ICD-10-CM | POA: Insufficient documentation

## 2022-03-12 DIAGNOSIS — I110 Hypertensive heart disease with heart failure: Secondary | ICD-10-CM | POA: Insufficient documentation

## 2022-03-12 DIAGNOSIS — Z79899 Other long term (current) drug therapy: Secondary | ICD-10-CM | POA: Insufficient documentation

## 2022-03-12 DIAGNOSIS — M79604 Pain in right leg: Secondary | ICD-10-CM | POA: Diagnosis not present

## 2022-03-12 DIAGNOSIS — R110 Nausea: Secondary | ICD-10-CM | POA: Diagnosis not present

## 2022-03-12 DIAGNOSIS — Z7901 Long term (current) use of anticoagulants: Secondary | ICD-10-CM | POA: Insufficient documentation

## 2022-03-12 DIAGNOSIS — Z1152 Encounter for screening for COVID-19: Secondary | ICD-10-CM | POA: Insufficient documentation

## 2022-03-12 LAB — COMPREHENSIVE METABOLIC PANEL
ALT: 27 U/L (ref 0–44)
AST: 29 U/L (ref 15–41)
Albumin: 3.9 g/dL (ref 3.5–5.0)
Alkaline Phosphatase: 90 U/L (ref 38–126)
Anion gap: 10 (ref 5–15)
BUN: 13 mg/dL (ref 8–23)
CO2: 24 mmol/L (ref 22–32)
Calcium: 8.6 mg/dL — ABNORMAL LOW (ref 8.9–10.3)
Chloride: 103 mmol/L (ref 98–111)
Creatinine, Ser: 1.32 mg/dL — ABNORMAL HIGH (ref 0.61–1.24)
GFR, Estimated: >60 mL/min (ref >60)
Glucose, Bld: 124 mg/dL — ABNORMAL HIGH (ref 70–99)
Potassium: 3.4 mmol/L — ABNORMAL LOW (ref 3.5–5.1)
Sodium: 137 mmol/L (ref 135–145)
Total Bilirubin: 0.9 mg/dL (ref 0.3–1.2)
Total Protein: 7.5 g/dL (ref 6.5–8.1)

## 2022-03-12 LAB — TROPONIN I (HIGH SENSITIVITY): Troponin I (High Sensitivity): 12 ng/L (ref <18)

## 2022-03-12 LAB — CBC
HCT: 44.9 % (ref 39.0–52.0)
Hemoglobin: 13.2 g/dL (ref 13.0–17.0)
MCH: 21.6 pg — ABNORMAL LOW (ref 26.0–34.0)
MCHC: 29.4 g/dL — ABNORMAL LOW (ref 30.0–36.0)
MCV: 73.4 fL — ABNORMAL LOW (ref 80.0–100.0)
Platelets: 393 10*3/uL (ref 150–400)
RBC: 6.12 MIL/uL — ABNORMAL HIGH (ref 4.22–5.81)
RDW: 17.8 % — ABNORMAL HIGH (ref 11.5–15.5)
WBC: 10.2 10*3/uL (ref 4.0–10.5)
nRBC: 0 % (ref 0.0–0.2)

## 2022-03-12 LAB — URINALYSIS, MICROSCOPIC (REFLEX)

## 2022-03-12 LAB — RESP PANEL BY RT-PCR (RSV, FLU A&B, COVID)  RVPGX2
Influenza A by PCR: NEGATIVE
Influenza B by PCR: NEGATIVE
Resp Syncytial Virus by PCR: NEGATIVE
SARS Coronavirus 2 by RT PCR: NEGATIVE

## 2022-03-12 LAB — URINALYSIS, ROUTINE W REFLEX MICROSCOPIC
Bilirubin Urine: NEGATIVE
Glucose, UA: >=500 mg/dL — AB
Hgb urine dipstick: NEGATIVE
Ketones, ur: NEGATIVE mg/dL
Leukocytes,Ua: NEGATIVE
Nitrite: NEGATIVE
Protein, ur: NEGATIVE mg/dL
Specific Gravity, Urine: 1.02 (ref 1.005–1.030)
pH: 5.5 (ref 5.0–8.0)

## 2022-03-12 LAB — LIPASE, BLOOD: Lipase: 39 U/L (ref 11–51)

## 2022-03-12 LAB — BRAIN NATRIURETIC PEPTIDE: B Natriuretic Peptide: 41.1 pg/mL (ref 0.0–100.0)

## 2022-03-12 LAB — CBG MONITORING, ED: Glucose-Capillary: 85 mg/dL (ref 70–99)

## 2022-03-12 MED ORDER — ONDANSETRON HCL 4 MG/2ML IJ SOLN
4.0000 mg | Freq: Once | INTRAMUSCULAR | Status: AC
  Administered 2022-03-12: 4 mg via INTRAVENOUS
  Filled 2022-03-12: qty 2

## 2022-03-12 MED ORDER — SODIUM CHLORIDE 0.9 % IV BOLUS
1000.0000 mL | Freq: Once | INTRAVENOUS | Status: AC
  Administered 2022-03-12: 1000 mL via INTRAVENOUS

## 2022-03-12 NOTE — ED Provider Notes (Signed)
[facility] Provider Note   CSN: [id number] Arrival date & time: 03/12/22  [id number]     History  Chief Complaint  Patient presents with   Weakness   Nausea    Orren Timblin is a 62 y.o. male with a past medical history of hypertension, CHF, NSTEMI and type 2 diabetes presenting today due to weakness, congestion, cough, headaches and "just not feeling like himself."  Reports intermittent chest pain that appears to be somewhat chronic.  No shortness of breath or leg swelling.  Endorses some episodes of lightheadedness when he moves too quickly from the seated to standing position.  Does report he has pain in his legs chronically due to arthritis and previous orthopedic injuries.  Reports normal bowel movements, no nausea or vomiting.  No known sick contacts and reports taking his metformin as prescribed.  Weakness      Home Medications Prior to Admission medications   Medication Sig Start Date End Date Taking? Authorizing Provider  amiodarone (PACERONE) 200 MG tablet Take 1 tablet (200 mg total) by mouth daily. Take 2 tablets (400 mg) twice daily for two weeks, then 1 tablet (200 mg) twice daily for two weeks. Then take 1 tablet (200 mg) daily there after. 12/14/21   Deboraha Sprang, MD  apixaban (ELIQUIS) 5 MG TABS tablet Take 1 tablet (5 mg total) by mouth 2 (two) times daily. 09/24/21   Bensimhon, Shaune Pascal, MD  atorvastatin (LIPITOR) 80 MG tablet Take 1 tablet (80 mg total) by mouth daily. 02/21/22   Deboraha Sprang, MD  carvedilol (COREG) 12.5 MG tablet Take 1 tablet (12.5 mg total) by mouth 2 (two) times daily with a meal. 10/23/21   Consuelo Pandy, PA-C  Cholecalciferol (VITAMIN D3) 10 MCG (400 UNIT) tablet Take 400 Units by mouth daily.    [provider]  diclofenac Sodium (VOLTAREN) 1 % GEL Apply 2 g topically 4 (four) times daily. 11/06/21   Angelique Blonder, DO  empagliflozin (JARDIANCE) 10 MG TABS tablet Take 1 tablet (10  mg total) by mouth daily. 04/04/21   Clegg, Amy D, NP  fluticasone (FLONASE) 50 MCG/ACT nasal spray Place 1 spray into both nostrils as needed for allergies or rhinitis.    [provider]  furosemide (LASIX) 40 MG tablet TAKE 2 TABLETS (80 MG TOTAL) BY MOUTH EVERY MORNING AND 1 TABLET (40 MG TOTAL) EVERY EVENING. 11/20/21   Clegg, Amy D, NP  metFORMIN (GLUCOPHAGE) 500 MG tablet Take 500 mg by mouth 2 (two) times daily with a meal.    [provider]  potassium chloride SA (KLOR-CON M) 20 MEQ tablet Take 1 tablet (20 mEq total) by mouth daily. 02/08/21   Domenic Polite, MD  sacubitril-valsartan (ENTRESTO) 49-51 MG Take 1 tablet by mouth 2 (two) times daily. 12/14/21   Deboraha Sprang, MD  spironolactone (ALDACTONE) 25 MG tablet TAKE 1/2 TABLET BY MOUTH EVERY DAY 10/08/21   Bensimhon, Shaune Pascal, MD      Allergies    Diphenhydramine hcl and Lisinopril    Review of Systems   Review of Systems  Neurological:  Positive for weakness.    Physical Exam Updated Vital Signs BP 108/70 (BP Location: Left Arm)   Pulse (!) 58   Temp 98.5 F (36.9 C) (Oral)   Resp 17   Ht 5' 10"$  (1.778 m)   Wt (!) 140.2 kg   SpO2 93%   BMI 44.34 kg/m  Physical Exam Vitals and  nursing note reviewed.  Constitutional:      General: He is not in acute distress.    Appearance: Normal appearance. He is not ill-appearing.  HENT:     Head: Normocephalic and atraumatic.     Right Ear: Tympanic membrane normal.     Left Ear: Tympanic membrane normal.     Nose: Congestion present.     Mouth/Throat:     Mouth: Mucous membranes are dry.     Pharynx: Oropharynx is clear.  Eyes:     General: No scleral icterus.    Conjunctiva/sclera: Conjunctivae normal.  Cardiovascular:     Rate and Rhythm: Normal rate and regular rhythm.  Pulmonary:     Effort: Pulmonary effort is normal. No respiratory distress.     Breath sounds: No wheezing or rales.  Abdominal:     General: Abdomen is flat.     Palpations:  Abdomen is soft.     Tenderness: There is no abdominal tenderness.  Musculoskeletal:        General: No tenderness.     Right lower leg: No edema.     Left lower leg: No edema.  Skin:    General: Skin is warm and dry.     Findings: No rash.  Neurological:     Mental Status: He is alert.  Psychiatric:        Mood and Affect: Mood normal.     ED Results / Procedures / Treatments   Labs (all labs ordered are listed, but only abnormal results are displayed) Labs Reviewed  COMPREHENSIVE METABOLIC PANEL - Abnormal; Notable for the following components:      Result Value   Potassium 3.4 (*)    Glucose, Bld 124 (*)    Creatinine, Ser 1.32 (*)    Calcium 8.6 (*)    All other components within normal limits  CBC - Abnormal; Notable for the following components:   RBC 6.12 (*)    MCV 73.4 (*)    MCH 21.6 (*)    MCHC 29.4 (*)    RDW 17.8 (*)    All other components within normal limits  URINALYSIS, ROUTINE W REFLEX MICROSCOPIC - Abnormal; Notable for the following components:   Glucose, UA >=500 (*)    All other components within normal limits  URINALYSIS, MICROSCOPIC (REFLEX) - Abnormal; Notable for the following components:   Bacteria, UA RARE (*)    All other components within normal limits  RESP PANEL BY RT-PCR (RSV, FLU A&B, COVID)  RVPGX2  LIPASE, BLOOD  BRAIN NATRIURETIC PEPTIDE  CBG MONITORING, ED  TROPONIN I (HIGH SENSITIVITY)    EKG EKG Interpretation  Date/Time:  Tuesday March 12 2022 09:11:24 EST Ventricular Rate:  63 PR Interval:  231 QRS Duration: 125 QT Interval:  441 QTC Calculation: 452 R Axis:   -46 Text Interpretation: Sinus rhythm Prolonged PR interval Left bundle branch block No significant change since last tracing Confirmed by Gareth Morgan [phone]) on 03/12/2022 10:31:35 AM  Radiology DG Chest 2 View  Result Date: 03/12/2022 CLINICAL DATA:  Chest pain EXAM: CHEST - 2 VIEW COMPARISON:  02/06/2022 FINDINGS: The heart size and mediastinal  contours are within normal limits. Both lungs are clear. The visualized skeletal structures are unremarkable. IMPRESSION: No acute abnormality of the lungs. Electronically Signed   By: Delanna Ahmadi M.D.   On: 03/12/2022 09:52    Procedures Procedures    Medications Ordered in ED Medications  sodium chloride 0.9 % bolus 1,000 mL (0 mLs Intravenous  Stopped 03/12/22 1142)  ondansetron (ZOFRAN) injection 4 mg (4 mg Intravenous Given 03/12/22 1025)    ED Course/ Medical Decision Making/ A&P                             Medical Decision Making Amount and/or Complexity of Data Reviewed Labs: ordered. Radiology: ordered.  Risk Prescription drug management.   62 year old male presenting today with multiple complaints.  Most concerned about overall weakness, nausea and occasional lightheadedness when going from seated to standing.  Differential includes but is not limited to viral illness, ACS, dissection, PE, heart failure exacerbation, electrolyte abnormality.  This is not an exhaustive differential.    Past Medical History / Co-morbidities / Social History: Prediabetes, CHF, paroxysmal A-fib   Additional history: Per chart review patient follows closely with his PCP.  Last A1c in the prediabetic range.  He is on metformin but has not required insulin.  Also appears to have paroxysmal A-fib for which she follows with cardiology.  Follows with physical therapy due to chronic bilateral lower extremity pain and gait abnormalities   Physical Exam: Pertinent physical exam findings include Relatively unremarkable Nasal congestion, dry mucous membranes  Lab Tests: I ordered, and personally interpreted labs.  The pertinent results include: Glucosuria Elevated creatinine appears to be patient's baseline, 1.32 Negative viral panel   Imaging Studies: I ordered and independently visualized and interpreted chest x-ray and I agree with the radiologist that no acute findings   Cardiac  Monitoring:  The patient was maintained on a cardiac monitor.  I viewed and interpreted the cardiac monitored which showed an underlying rhythm of: Sinus   Medications: I ordered medication including fluid bolus.  Patient denied any pain.  Was somewhat nauseated which improved with Zofran.   MDM/Disposition: This is a 62 year old male who presented today due to nausea, weakness and overall fatigue.  He says that he has a history of heart failure and usually does not like to play around with his health.  Physical exam relatively reassuring.  He did have some dry mucous membranes and nasal congestion.  Lung sounds were clear.  Chest x-ray also normal.  EKG nonischemic and in sinus despite paroxysmal A-fib.  Denies any chest pain.  1 troponin negative.  Considered PE however patient is not complaining of shortness of breath or chest pain and is already anticoagulated secondary to his paroxysmal A-fib.  No pulmonary edema and normal BNP, doubt heart failure exacerbation.  Do not believe he has any intra cranial abnormalities based off of his physical exam.  Normal strength in bilateral upper and lower extremities.  No facial droop or aphasia.  No sensation deficits.  At this time I am somewhat suspicious for a viral illness causing his multitude of symptoms, inclusive of congestion, overall weakness that is nonfocal, nausea and occasional lightheadedness when he goes from seated to standing.  He had some hypotensive episodes to the 90s over 50s and was given a fluid bolus.  Additionally, he has glucosuria and a mildly elevated glucose, potentially could be due to some hyperglycemia despite only mildly elevated levels today.  This is something he can speak to his PCP about.  He reports feeling better after Zofran and fluids and will be discharged at this time.  Final Clinical Impression(s) / ED Diagnoses Final diagnoses:  Generalized weakness  Nausea    Rx / DC Orders ED Discharge Orders     None  Results and diagnoses were explained to the patient. Return precautions discussed in full. Patient had no additional questions and expressed complete understanding.   This chart was dictated using voice recognition software.  Despite best efforts to proofread,  errors can occur which can change the documentation meaning.    Darliss Ridgel 03/12/22 1217    Gareth Morgan, MD 03/14/22 1623

## 2022-03-12 NOTE — ED Notes (Signed)
Patient transported to X-ray 

## 2022-03-12 NOTE — ED Triage Notes (Signed)
Pt here for nausea, dizziness, HA and bilateral leg weakness x 2-3 days. Pt denies vomiting, fevers and chills. Reports L side intermittent chest pain, but states he has just been feeling off.

## 2022-03-12 NOTE — ED Notes (Signed)
IV Removed  

## 2022-03-12 NOTE — Discharge Instructions (Addendum)
You came to the emergency department today due to weakness, nausea and not feeling like yourself.  All of your lab work is at your baseline.  As we discussed you may have very mild kidney disease so please speak with your PCP about this.  Do not be alarmed at this time.  You had lots of sugar in your urine so please continue to try and decrease your sugar and carbohydrates.  Continue to follow your hyperglycemia with your doctor as well.  I suspect all of your symptoms to be somewhat secondary to a virus that was not detected today.  Please do not hesitate to return to the emergency department with any recurring or worsening symptoms.  It was a pleasure to meet you and we hope you feel better!

## 2022-03-13 ENCOUNTER — Encounter: Payer: Self-pay | Admitting: Family

## 2022-03-14 NOTE — Addendum Note (Signed)
Addended by: Thora Lance on: 03/14/2022 02:09 PM   Modules accepted: Orders

## 2022-03-15 ENCOUNTER — Ambulatory Visit: Payer: Medicaid Other

## 2022-03-15 ENCOUNTER — Encounter: Payer: Self-pay | Admitting: Family

## 2022-03-15 ENCOUNTER — Ambulatory Visit: Payer: Medicaid Other | Attending: Internal Medicine

## 2022-03-15 ENCOUNTER — Ambulatory Visit (INDEPENDENT_AMBULATORY_CARE_PROVIDER_SITE_OTHER): Payer: Medicaid Other

## 2022-03-15 DIAGNOSIS — I4891 Unspecified atrial fibrillation: Secondary | ICD-10-CM | POA: Diagnosis present

## 2022-03-15 DIAGNOSIS — I493 Ventricular premature depolarization: Secondary | ICD-10-CM

## 2022-03-15 DIAGNOSIS — I428 Other cardiomyopathies: Secondary | ICD-10-CM

## 2022-03-15 LAB — ECHOCARDIOGRAM COMPLETE
Area-P 1/2: 2.36 cm2
S' Lateral: 5.1 cm

## 2022-03-15 NOTE — Progress Notes (Unsigned)
Applied a 3 day Zio XT monitor to patient in the office

## 2022-03-16 LAB — COMPREHENSIVE METABOLIC PANEL
ALT: 25 IU/L (ref 0–44)
AST: 26 IU/L (ref 0–40)
Albumin/Globulin Ratio: 1.6 (ref 1.2–2.2)
Albumin: 4.3 g/dL (ref 3.9–4.9)
Alkaline Phosphatase: 102 IU/L (ref 44–121)
BUN/Creatinine Ratio: 10 (ref 10–24)
BUN: 13 mg/dL (ref 8–27)
Bilirubin Total: 0.6 mg/dL (ref 0.0–1.2)
CO2: 22 mmol/L (ref 20–29)
Calcium: 9.1 mg/dL (ref 8.6–10.2)
Chloride: 103 mmol/L (ref 96–106)
Creatinine, Ser: 1.32 mg/dL — ABNORMAL HIGH (ref 0.76–1.27)
Globulin, Total: 2.7 g/dL (ref 1.5–4.5)
Glucose: 99 mg/dL (ref 70–99)
Potassium: 4.2 mmol/L (ref 3.5–5.2)
Sodium: 141 mmol/L (ref 134–144)
Total Protein: 7 g/dL (ref 6.0–8.5)
eGFR: 61 mL/min/{1.73_m2} (ref >59)

## 2022-03-17 ENCOUNTER — Other Ambulatory Visit (HOSPITAL_COMMUNITY): Payer: Self-pay | Admitting: Adult Health

## 2022-03-17 ENCOUNTER — Other Ambulatory Visit (HOSPITAL_COMMUNITY): Payer: Self-pay | Admitting: Internal Medicine

## 2022-03-18 ENCOUNTER — Other Ambulatory Visit (HOSPITAL_COMMUNITY): Payer: Self-pay

## 2022-03-18 MED ORDER — EMPAGLIFLOZIN 10 MG PO TABS
10.0000 mg | ORAL_TABLET | Freq: Every day | ORAL | 0 refills | Status: DC
  Filled 2022-03-18: qty 90, 90d supply, fill #0

## 2022-03-19 ENCOUNTER — Encounter: Payer: Self-pay | Admitting: Family

## 2022-03-19 ENCOUNTER — Inpatient Hospital Stay: Payer: Medicaid Other

## 2022-03-19 ENCOUNTER — Inpatient Hospital Stay (HOSPITAL_BASED_OUTPATIENT_CLINIC_OR_DEPARTMENT_OTHER): Payer: Medicaid Other | Admitting: Family

## 2022-03-19 ENCOUNTER — Inpatient Hospital Stay: Payer: Medicaid Other | Attending: Hematology & Oncology

## 2022-03-19 ENCOUNTER — Other Ambulatory Visit: Payer: Self-pay

## 2022-03-19 VITALS — BP 115/71 | HR 60 | Resp 18

## 2022-03-19 VITALS — BP 103/65 | HR 54 | Temp 98.4°F | Resp 19 | Ht 70.0 in | Wt 322.0 lb

## 2022-03-19 DIAGNOSIS — D751 Secondary polycythemia: Secondary | ICD-10-CM

## 2022-03-19 DIAGNOSIS — Z9889 Other specified postprocedural states: Secondary | ICD-10-CM

## 2022-03-19 DIAGNOSIS — Z1589 Genetic susceptibility to other disease: Secondary | ICD-10-CM

## 2022-03-19 DIAGNOSIS — D45 Polycythemia vera: Secondary | ICD-10-CM | POA: Diagnosis not present

## 2022-03-19 DIAGNOSIS — D500 Iron deficiency anemia secondary to blood loss (chronic): Secondary | ICD-10-CM | POA: Diagnosis not present

## 2022-03-19 DIAGNOSIS — R5383 Other fatigue: Secondary | ICD-10-CM | POA: Insufficient documentation

## 2022-03-19 LAB — CMP (CANCER CENTER ONLY)
ALT: 22 U/L (ref 0–44)
AST: 22 U/L (ref 15–41)
Albumin: 4.7 g/dL (ref 3.5–5.0)
Alkaline Phosphatase: 88 U/L (ref 38–126)
Anion gap: 10 (ref 5–15)
BUN: 13 mg/dL (ref 8–23)
CO2: 27 mmol/L (ref 22–32)
Calcium: 9.5 mg/dL (ref 8.9–10.3)
Chloride: 105 mmol/L (ref 98–111)
Creatinine: 1.4 mg/dL — ABNORMAL HIGH (ref 0.61–1.24)
GFR, Estimated: 57 mL/min — ABNORMAL LOW (ref >60)
Glucose, Bld: 99 mg/dL (ref 70–99)
Potassium: 3.8 mmol/L (ref 3.5–5.1)
Sodium: 142 mmol/L (ref 135–145)
Total Bilirubin: 0.7 mg/dL (ref 0.3–1.2)
Total Protein: 7.4 g/dL (ref 6.5–8.1)

## 2022-03-19 LAB — CBC WITH DIFFERENTIAL (CANCER CENTER ONLY)
Abs Immature Granulocytes: 0.04 10*3/uL (ref 0.00–0.07)
Basophils Absolute: 0.1 10*3/uL (ref 0.0–0.1)
Basophils Relative: 1 %
Eosinophils Absolute: 0.2 10*3/uL (ref 0.0–0.5)
Eosinophils Relative: 2 %
HCT: 46.1 % (ref 39.0–52.0)
Hemoglobin: 13.6 g/dL (ref 13.0–17.0)
Immature Granulocytes: 0 %
Lymphocytes Relative: 44 %
Lymphs Abs: 4.7 10*3/uL — ABNORMAL HIGH (ref 0.7–4.0)
MCH: 21.5 pg — ABNORMAL LOW (ref 26.0–34.0)
MCHC: 29.5 g/dL — ABNORMAL LOW (ref 30.0–36.0)
MCV: 72.8 fL — ABNORMAL LOW (ref 80.0–100.0)
Monocytes Absolute: 0.7 10*3/uL (ref 0.1–1.0)
Monocytes Relative: 7 %
Neutro Abs: 5 10*3/uL (ref 1.7–7.7)
Neutrophils Relative %: 46 %
Platelet Count: 402 10*3/uL — ABNORMAL HIGH (ref 150–400)
RBC: 6.33 MIL/uL — ABNORMAL HIGH (ref 4.22–5.81)
RDW: 17.8 % — ABNORMAL HIGH (ref 11.5–15.5)
WBC Count: 10.8 10*3/uL — ABNORMAL HIGH (ref 4.0–10.5)
nRBC: 0 % (ref 0.0–0.2)

## 2022-03-19 LAB — FERRITIN: Ferritin: 10 ng/mL — ABNORMAL LOW (ref 24–336)

## 2022-03-19 MED ORDER — SODIUM CHLORIDE 0.9 % IV SOLN: INTRAVENOUS | Status: DC

## 2022-03-19 MED ORDER — SODIUM CHLORIDE 0.9 % IV SOLN: Freq: Once | INTRAVENOUS | Status: DC

## 2022-03-19 NOTE — Progress Notes (Signed)
Reshard Kittelson presents today for phlebotomy per MD orders. Phlebotomy procedure started at 1435 and ended at 1450. 150 cc removed. 1502 20G to L FA procedure restarted and ended at 1519 IVF given after procedure over 30 minutes Patient tolerated procedure well. IV needle removed intact.

## 2022-03-19 NOTE — Progress Notes (Signed)
Hematology and Oncology Follow Up Visit  John Conner [id number] 03-Oct-1960 62 y.o. 03/19/2022   Principle Diagnosis:  JAK 2 positive    Current Therapy:        Phlebotomy to maintain Hct < 45%   Interim History:  John Conner is here today for follow-up. He is doing well and feeling much better. He had some nausea, headache and weakness last week and went to the ED. He was treated with zofran and IV fluids which helped resolve his symptoms.  He still feels a little fatigue at times.  Hct today is 46.1% so we will proceed with phlebotomy.  No fever, chills, n/v, cough, rash, dizziness, SOB, chest pain, palpitations, abdominal pain or changes in bowel or bladder habits.  No swelling, tenderness, numbness or tingling in his extremities at this time.  No falls or syncope.  He ambulates with a Rolator for added support.  Appetite and hydration are good. Weight is stable at 322 lbs.   ECOG Performance Status: 1 - Symptomatic but completely ambulatory  Medications:  Allergies as of 03/19/2022       Reactions   Diphenhydramine Hcl Swelling   Lips swell   Lisinopril Cough        Medication List        Accurate as of March 19, 2022  1:31 PM. If you have any questions, ask your nurse or doctor.          amiodarone 200 MG tablet Commonly known as: PACERONE Take 1 tablet (200 mg total) by mouth daily. Take 2 tablets (400 mg) twice daily for two weeks, then 1 tablet (200 mg) twice daily for two weeks. Then take 1 tablet (200 mg) daily there after.   apixaban 5 MG Tabs tablet Commonly known as: ELIQUIS Take 1 tablet (5 mg total) by mouth 2 (two) times daily.   atorvastatin 80 MG tablet Commonly known as: LIPITOR TAKE 1 TABLET BY MOUTH EVERY DAY   carvedilol 12.5 MG tablet Commonly known as: COREG Take 1 tablet (12.5 mg total) by mouth 2 (two) times daily with a meal.   diclofenac Sodium 1 % Gel Commonly known as: VOLTAREN Apply 2 g topically 4 (four) times daily.    empagliflozin 10 MG Tabs tablet Commonly known as: Jardiance Take 1 tablet (10 mg total) by mouth daily.   Entresto 49-51 MG Generic drug: sacubitril-valsartan Take 1 tablet by mouth 2 (two) times daily.   fluticasone 50 MCG/ACT nasal spray Commonly known as: FLONASE Place 1 spray into both nostrils as needed for allergies or rhinitis.   furosemide 40 MG tablet Commonly known as: LASIX TAKE 2 TABLETS (80 MG TOTAL) BY MOUTH EVERY MORNING AND 1 TABLET (40 MG TOTAL) EVERY EVENING.   metFORMIN 500 MG tablet Commonly known as: GLUCOPHAGE Take 500 mg by mouth 2 (two) times daily with a meal.   potassium chloride SA 20 MEQ tablet Commonly known as: KLOR-CON M Take 1 tablet (20 mEq total) by mouth daily.   spironolactone 25 MG tablet Commonly known as: ALDACTONE TAKE 1/2 TABLET BY MOUTH EVERY DAY   Vitamin D3 10 MCG (400 UNIT) Tabs tablet Generic drug: cholecalciferol Take 400 Units by mouth daily.        Allergies:  Allergies  Allergen Reactions   Diphenhydramine Hcl Swelling    Lips swell     Lisinopril Cough    Past Medical History, Surgical history, Social history, and Family History were reviewed and updated.  Review of Systems: All other  10 point review of systems is negative.   Physical Exam:  vitals were not taken for this visit.   Wt Readings from Last 3 Encounters:  03/12/22 (!) 309 lb (140.2 kg)  02/12/22 (!) 316 lb 12.8 oz (143.7 kg)  02/06/22 (!) 320 lb (145.2 kg)    Ocular: Sclerae unicteric, pupils equal, round and reactive to light Ear-nose-throat: Oropharynx clear, dentition fair Lymphatic: No cervical or supraclavicular adenopathy Lungs no rales or rhonchi, good excursion bilaterally Heart regular rate and rhythm, no murmur appreciated Abd soft, nontender, positive bowel sounds MSK no focal spinal tenderness, no joint edema Neuro: non-focal, well-oriented, appropriate affect Breasts: Deferred   Lab Results  Component Value Date   WBC  10.2 03/12/2022   HGB 13.2 03/12/2022   HCT 44.9 03/12/2022   MCV 73.4 (L) 03/12/2022   PLT 393 03/12/2022   Lab Results  Component Value Date   FERRITIN 8 (L) 02/12/2022   IRON 25 (L) 02/12/2022   TIBC 395 02/12/2022   UIBC 370 02/12/2022   IRONPCTSAT 6 (L) 02/12/2022   Lab Results  Component Value Date   RETICCTPCT 1.4 11/12/2021   RBC 6.12 (H) 03/12/2022   No results found for: "KPAFRELGTCHN", "LAMBDASER", "KAPLAMBRATIO" No results found for: "IGGSERUM", "IGA", "IGMSERUM" No results found for: "TOTALPROTELP", "ALBUMINELP", "A1GS", "A2GS", "BETS", "BETA2SER", "GAMS", "MSPIKE", "SPEI"   Chemistry      Component Value Date/Time   NA 141 03/15/2022 0857   K 4.2 03/15/2022 0857   CL 103 03/15/2022 0857   CO2 22 03/15/2022 0857   BUN 13 03/15/2022 0857   CREATININE 1.32 (H) 03/15/2022 0857   CREATININE 1.35 (H) 02/12/2022 1342   CREATININE 1.11 09/09/2014 1017      Component Value Date/Time   CALCIUM 9.1 03/15/2022 0857   ALKPHOS 102 03/15/2022 0857   AST 26 03/15/2022 0857   AST 30 02/12/2022 1342   ALT 25 03/15/2022 0857   ALT 27 02/12/2022 1342   BILITOT 0.6 03/15/2022 0857   BILITOT 0.4 02/12/2022 1342       Impression and Plan: John Conner is a very pleasant 62 yo African American gentleman with positive JAK 2.  Phlebotomy today for Hct 46.1%.  Lab check and phlebotomy every 6 weeks.  Follow-up in 3 months.   Lottie Dawson, NP 2/20/20241:31 PM

## 2022-03-19 NOTE — Patient Instructions (Signed)

## 2022-03-20 LAB — IRON AND IRON BINDING CAPACITY (CC-WL,HP ONLY)
Iron: 24 ug/dL — ABNORMAL LOW (ref 45–182)
Saturation Ratios: 6 % — ABNORMAL LOW (ref 17.9–39.5)
TIBC: 405 ug/dL (ref 250–450)
UIBC: 381 ug/dL — ABNORMAL HIGH (ref 117–376)

## 2022-03-28 ENCOUNTER — Other Ambulatory Visit (HOSPITAL_COMMUNITY): Payer: Self-pay | Admitting: Internal Medicine

## 2022-04-04 ENCOUNTER — Telehealth: Payer: Self-pay

## 2022-04-04 NOTE — Telephone Encounter (Signed)
**Note De-Identified John Conner Obfuscation** Entresto PA started through covermymeds. Key: [id number]

## 2022-04-17 ENCOUNTER — Encounter: Payer: Self-pay | Admitting: Internal Medicine

## 2022-04-17 ENCOUNTER — Ambulatory Visit: Payer: Medicaid Other

## 2022-04-17 ENCOUNTER — Ambulatory Visit: Payer: Medicaid Other | Attending: Internal Medicine | Admitting: Internal Medicine

## 2022-04-17 VITALS — BP 124/72 | HR 60 | Ht 70.0 in | Wt 326.0 lb

## 2022-04-17 DIAGNOSIS — I4819 Other persistent atrial fibrillation: Secondary | ICD-10-CM | POA: Diagnosis not present

## 2022-04-17 DIAGNOSIS — I428 Other cardiomyopathies: Secondary | ICD-10-CM | POA: Diagnosis not present

## 2022-04-17 DIAGNOSIS — I493 Ventricular premature depolarization: Secondary | ICD-10-CM

## 2022-04-17 DIAGNOSIS — E039 Hypothyroidism, unspecified: Secondary | ICD-10-CM | POA: Diagnosis not present

## 2022-04-17 DIAGNOSIS — Z79899 Other long term (current) drug therapy: Secondary | ICD-10-CM

## 2022-04-17 MED ORDER — FUROSEMIDE 40 MG PO TABS: 40.0000 mg | ORAL_TABLET | Freq: Every day | ORAL | 3 refills | Status: DC
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
## 2022-04-17 NOTE — Progress Notes (Unsigned)
Enrolled for [employer] to mail a ZIO XT long term holter monitor to the patients address on file.  

## 2022-04-17 NOTE — Patient Instructions (Addendum)
Medication Instructions:  Your physician has recommended you make the following change in your medication:   ** Take Lasix 40mg  - 1 tablet twice daily x 4 days then reduce to 40mg  - 1 tablet by mouth daily.  ** Increase potassium 68meq to 1 tablet twice daily x 4 days then return to 1 tablet by mouth daily.  *If you need a refill on your cardiac medications before your next appointment, please call your pharmacy*   Lab Work: TSH  If you have labs (blood work) drawn today and your tests are completely normal, you will receive your results only by: [facility] (if you have MyChart) OR A paper copy in the mail If you have any lab test that is abnormal or we need to change your treatment, we will call you to review the results.   Testing/Procedures: ZIO XT- Long Term Monitor Instructions - to start in 3 months  Your physician has requested you wear a ZIO patch monitor for 3 days.  This is a single patch monitor. [employer] supplies one patch monitor per enrollment. Additional stickers are not available. Please do not apply patch if you will be having a Nuclear Stress Test,  Echocardiogram, Cardiac CT, MRI, or Chest Xray during the period you would be wearing the  monitor. The patch cannot be worn during these tests. You cannot remove and re-apply the  ZIO XT patch monitor.  Your ZIO patch monitor will be mailed 3 day USPS to your address on file. It may take 3-5 days  to receive your monitor after you have been enrolled.  Once you have received your monitor, please review the enclosed instructions. Your monitor  has already been registered assigning a specific monitor serial # to you.  Billing and Patient Assistance Program Information  We have supplied [employer] with any of your insurance information on file for billing purposes. [employer] offers a sliding scale Patient Assistance Program for patients that do not have  insurance, or whose insurance does not completely cover the cost of  the ZIO monitor.  You must apply for the Patient Assistance Program to qualify for this discounted rate.  To apply, please call [employer] at [phone], select option 4, select option 2, ask to apply for  Patient Assistance Program. Theodore Demark will ask your household income, and how many people  are in your household. They will quote your out-of-pocket cost based on that information.  [employer] will also be able to set up a 63-month, interest-free payment plan if needed.  Applying the monitor   Shave hair from upper left chest.  Hold abrader disc by orange tab. Rub abrader in 40 strokes over the upper left chest as  indicated in your monitor instructions.  Clean area with 4 enclosed alcohol pads. Let dry.  Apply patch as indicated in monitor instructions. Patch will be placed under collarbone on left  side of chest with arrow pointing upward.  Rub patch adhesive wings for 2 minutes. Remove white label marked "1". Remove the white  label marked "2". Rub patch adhesive wings for 2 additional minutes.  While looking in a mirror, press and release button in center of patch. A small green light will  flash 3-4 times. This will be your only indicator that the monitor has been turned on.  Do not shower for the first 24 hours. You may shower after the first 24 hours.  Press the button if you feel a symptom. You will hear a small click. Record Date, Time and  Symptom in the Patient Logbook.  When you are ready to remove the patch, follow instructions on the last 2 pages of Patient  Logbook. Stick patch monitor onto the last page of Patient Logbook.  Place Patient Logbook in the blue and white box. Use locking tab on box and tape box closed  securely. The blue and white box has prepaid postage on it. Please place it in the mailbox as  soon as possible. Your physician should have your test results approximately 7 days after the  monitor has been mailed back to [facility].  Call [facility] at [phone] if you have questions regarding  your ZIO XT patch monitor. Call them immediately if you see an orange light blinking on your  monitor.  If your monitor falls off in less than 4 days, contact our Monitor department at [phone].  If your monitor becomes loose or falls off after 4 days call [employer] at [phone] for  suggestions on securing your monitor    Follow-Up: At [facility], you and your health needs are our priority.  As part of our continuing mission to provide you with exceptional heart care, we have created designated Provider Care Teams.  These Care Teams include your primary Cardiologist (physician) and Advanced Practice Providers (APPs -  Physician Assistants and Nurse Practitioners) who all work together to provide you with the care you need, when you need it.  We recommend signing up for the patient portal called "MyChart".  Sign up information is provided on this After Visit Summary.  MyChart is used to connect with patients for Virtual Visits (Telemedicine).  Patients are able to view lab/test results, encounter notes, upcoming appointments, etc.  Non-urgent messages can be sent to your provider as well.   To learn more about what you can do with MyChart, go to [email or web address].    Your next appointment:   4 months with Dr Caryl Comes

## 2022-04-18 ENCOUNTER — Telehealth: Payer: Self-pay | Admitting: *Deleted

## 2022-04-18 NOTE — Telephone Encounter (Signed)
Contacted [employer] to cancel enrollment.  Monitor had already been shipped and is due to be delivered to patient by UPS 04/19/22.  I called patient and explained test has been cancelled.  Instructed patient how to return unused monitor to [employer] in box with prepaid USPS postage on it.

## 2022-04-18 NOTE — Telephone Encounter (Signed)
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

## 2022-05-01 ENCOUNTER — Inpatient Hospital Stay: Payer: Medicaid Other

## 2022-05-01 ENCOUNTER — Inpatient Hospital Stay: Payer: Medicaid Other | Attending: Hematology & Oncology

## 2022-05-01 VITALS — BP 94/52 | HR 64 | Temp 97.9°F | Resp 17

## 2022-05-01 DIAGNOSIS — D751 Secondary polycythemia: Secondary | ICD-10-CM

## 2022-05-01 DIAGNOSIS — D45 Polycythemia vera: Secondary | ICD-10-CM | POA: Diagnosis present

## 2022-05-01 DIAGNOSIS — D500 Iron deficiency anemia secondary to blood loss (chronic): Secondary | ICD-10-CM

## 2022-05-01 DIAGNOSIS — Z79899 Other long term (current) drug therapy: Secondary | ICD-10-CM | POA: Insufficient documentation

## 2022-05-01 DIAGNOSIS — Z1589 Genetic susceptibility to other disease: Secondary | ICD-10-CM

## 2022-05-01 LAB — CBC WITH DIFFERENTIAL (CANCER CENTER ONLY)
Abs Immature Granulocytes: 0.04 10*3/uL (ref 0.00–0.07)
Basophils Absolute: 0.1 10*3/uL (ref 0.0–0.1)
Basophils Relative: 1 %
Eosinophils Absolute: 0.2 10*3/uL (ref 0.0–0.5)
Eosinophils Relative: 2 %
HCT: 45.6 % (ref 39.0–52.0)
Hemoglobin: 13 g/dL (ref 13.0–17.0)
Immature Granulocytes: 0 %
Lymphocytes Relative: 38 %
Lymphs Abs: 4.3 10*3/uL — ABNORMAL HIGH (ref 0.7–4.0)
MCH: 19.9 pg — ABNORMAL LOW (ref 26.0–34.0)
MCHC: 28.5 g/dL — ABNORMAL LOW (ref 30.0–36.0)
MCV: 69.7 fL — ABNORMAL LOW (ref 80.0–100.0)
Monocytes Absolute: 0.8 10*3/uL (ref 0.1–1.0)
Monocytes Relative: 7 %
Neutro Abs: 5.9 10*3/uL (ref 1.7–7.7)
Neutrophils Relative %: 52 %
Platelet Count: 376 10*3/uL (ref 150–400)
RBC: 6.54 MIL/uL — ABNORMAL HIGH (ref 4.22–5.81)
RDW: 20.4 % — ABNORMAL HIGH (ref 11.5–15.5)
WBC Count: 11.4 10*3/uL — ABNORMAL HIGH (ref 4.0–10.5)
nRBC: 0 % (ref 0.0–0.2)

## 2022-05-01 LAB — IRON AND IRON BINDING CAPACITY (CC-WL,HP ONLY)
Iron: 30 ug/dL — ABNORMAL LOW (ref 45–182)
Saturation Ratios: 8 % — ABNORMAL LOW (ref 17.9–39.5)
TIBC: 393 ug/dL (ref 250–450)
UIBC: 363 ug/dL (ref 117–376)

## 2022-05-01 LAB — CMP (CANCER CENTER ONLY)
ALT: 24 U/L (ref 0–44)
AST: 22 U/L (ref 15–41)
Albumin: 4.5 g/dL (ref 3.5–5.0)
Alkaline Phosphatase: 81 U/L (ref 38–126)
Anion gap: 10 (ref 5–15)
BUN: 14 mg/dL (ref 8–23)
CO2: 26 mmol/L (ref 22–32)
Calcium: 9.2 mg/dL (ref 8.9–10.3)
Chloride: 103 mmol/L (ref 98–111)
Creatinine: 1.35 mg/dL — ABNORMAL HIGH (ref 0.61–1.24)
GFR, Estimated: 60 mL/min — ABNORMAL LOW (ref >60)
Glucose, Bld: 98 mg/dL (ref 70–99)
Potassium: 3.8 mmol/L (ref 3.5–5.1)
Sodium: 139 mmol/L (ref 135–145)
Total Bilirubin: 0.8 mg/dL (ref 0.3–1.2)
Total Protein: 7.5 g/dL (ref 6.5–8.1)

## 2022-05-01 LAB — FERRITIN: Ferritin: 7 ng/mL — ABNORMAL LOW (ref 24–336)

## 2022-05-01 MED ORDER — SODIUM CHLORIDE 0.9 % IV SOLN: Freq: Once | INTRAVENOUS | Status: AC

## 2022-05-01 NOTE — Progress Notes (Signed)
Malikk Simrell presents today for phlebotomy per MD orders. Phlebotomy procedure started at 1054 and ended at 1124. 300 grams removed from lt [facility] using 18g IV catheter by MMorris, RN before clotting. Per patient request, no additional phlebotomy completed.  Patient observed for 30 minutes after procedure without any incident. 564mL IVF given.  Patient tolerated procedure well. IV needle removed intact.

## 2022-05-01 NOTE — Patient Instructions (Signed)

## 2022-06-12 ENCOUNTER — Inpatient Hospital Stay: Payer: Medicaid Other

## 2022-06-12 ENCOUNTER — Inpatient Hospital Stay: Payer: Medicaid Other | Admitting: Family

## 2022-06-17 ENCOUNTER — Inpatient Hospital Stay: Payer: Medicaid Other

## 2022-06-17 ENCOUNTER — Inpatient Hospital Stay: Payer: Medicaid Other | Admitting: Family

## 2022-06-18 ENCOUNTER — Encounter: Payer: Self-pay | Admitting: Family

## 2022-06-18 ENCOUNTER — Other Ambulatory Visit (HOSPITAL_BASED_OUTPATIENT_CLINIC_OR_DEPARTMENT_OTHER): Payer: Self-pay

## 2022-06-18 ENCOUNTER — Encounter: Payer: Self-pay | Admitting: Medical Oncology

## 2022-06-18 ENCOUNTER — Inpatient Hospital Stay: Payer: Medicaid Other

## 2022-06-18 ENCOUNTER — Other Ambulatory Visit: Payer: Self-pay

## 2022-06-18 ENCOUNTER — Inpatient Hospital Stay (HOSPITAL_BASED_OUTPATIENT_CLINIC_OR_DEPARTMENT_OTHER): Payer: Medicaid Other | Admitting: Medical Oncology

## 2022-06-18 ENCOUNTER — Inpatient Hospital Stay: Payer: Medicaid Other | Attending: Hematology & Oncology

## 2022-06-18 ENCOUNTER — Other Ambulatory Visit (HOSPITAL_COMMUNITY): Payer: Self-pay | Admitting: Adult Health

## 2022-06-18 VITALS — BP 110/65 | HR 59

## 2022-06-18 VITALS — BP 120/61 | HR 50 | Temp 97.6°F | Resp 19 | Ht 70.0 in | Wt 324.1 lb

## 2022-06-18 DIAGNOSIS — Z1589 Genetic susceptibility to other disease: Secondary | ICD-10-CM

## 2022-06-18 DIAGNOSIS — D751 Secondary polycythemia: Secondary | ICD-10-CM | POA: Insufficient documentation

## 2022-06-18 DIAGNOSIS — D500 Iron deficiency anemia secondary to blood loss (chronic): Secondary | ICD-10-CM | POA: Diagnosis not present

## 2022-06-18 LAB — CMP (CANCER CENTER ONLY)
ALT: 55 U/L — ABNORMAL HIGH (ref 0–44)
AST: 34 U/L (ref 15–41)
Albumin: 4.3 g/dL (ref 3.5–5.0)
Alkaline Phosphatase: 82 U/L (ref 38–126)
Anion gap: 6 (ref 5–15)
BUN: 11 mg/dL (ref 8–23)
CO2: 27 mmol/L (ref 22–32)
Calcium: 9 mg/dL (ref 8.9–10.3)
Chloride: 108 mmol/L (ref 98–111)
Creatinine: 1.28 mg/dL — ABNORMAL HIGH (ref 0.61–1.24)
GFR, Estimated: >60 mL/min (ref >60)
Glucose, Bld: 108 mg/dL — ABNORMAL HIGH (ref 70–99)
Potassium: 4.2 mmol/L (ref 3.5–5.1)
Sodium: 141 mmol/L (ref 135–145)
Total Bilirubin: 0.7 mg/dL (ref 0.3–1.2)
Total Protein: 7 g/dL (ref 6.5–8.1)

## 2022-06-18 LAB — CBC WITH DIFFERENTIAL (CANCER CENTER ONLY)
Abs Immature Granulocytes: 0.04 10*3/uL (ref 0.00–0.07)
Basophils Absolute: 0.1 10*3/uL (ref 0.0–0.1)
Basophils Relative: 1 %
Eosinophils Absolute: 0.3 10*3/uL (ref 0.0–0.5)
Eosinophils Relative: 2 %
HCT: 47.5 % (ref 39.0–52.0)
Hemoglobin: 13.3 g/dL (ref 13.0–17.0)
Immature Granulocytes: 0 %
Lymphocytes Relative: 41 %
Lymphs Abs: 4.6 10*3/uL — ABNORMAL HIGH (ref 0.7–4.0)
MCH: 19.4 pg — ABNORMAL LOW (ref 26.0–34.0)
MCHC: 28 g/dL — ABNORMAL LOW (ref 30.0–36.0)
MCV: 69.3 fL — ABNORMAL LOW (ref 80.0–100.0)
Monocytes Absolute: 0.7 10*3/uL (ref 0.1–1.0)
Monocytes Relative: 6 %
Neutro Abs: 5.6 10*3/uL (ref 1.7–7.7)
Neutrophils Relative %: 50 %
Platelet Count: 361 10*3/uL (ref 150–400)
RBC: 6.85 MIL/uL — ABNORMAL HIGH (ref 4.22–5.81)
RDW: 21.5 % — ABNORMAL HIGH (ref 11.5–15.5)
WBC Count: 11.3 10*3/uL — ABNORMAL HIGH (ref 4.0–10.5)
nRBC: 0 % (ref 0.0–0.2)

## 2022-06-18 LAB — IRON AND IRON BINDING CAPACITY (CC-WL,HP ONLY)
Iron: 22 ug/dL — ABNORMAL LOW (ref 45–182)
Saturation Ratios: 6 % — ABNORMAL LOW (ref 17.9–39.5)
TIBC: 375 ug/dL (ref 250–450)
UIBC: 353 ug/dL (ref 117–376)

## 2022-06-18 LAB — FERRITIN: Ferritin: 12 ng/mL — ABNORMAL LOW (ref 24–336)

## 2022-06-18 MED ORDER — SODIUM CHLORIDE 0.9 % IV SOLN: Freq: Once | INTRAVENOUS | Status: AC

## 2022-06-18 MED ORDER — EMPAGLIFLOZIN 10 MG PO TABS
10.0000 mg | ORAL_TABLET | Freq: Every day | ORAL | 0 refills | Status: DC
  Filled 2022-06-18: qty 60, 60d supply, fill #0

## 2022-06-18 NOTE — Progress Notes (Signed)
Hematology and Oncology Follow Up Visit  John Conner [id number] 24-Aug-1960 62 y.o. 06/18/2022   Principle Diagnosis:  JAK 2 positive    Current Therapy:        Phlebotomy to maintain Hct < 45%   Interim History:  Mr. Manetta is here today for follow-up. He reports that overall he is doing pretty well. Has had some headache so he thinks he is in need of a phlebotomy.   No fever, chills, n/v, cough, rash, dizziness, SOB, chest pain, palpitations, abdominal pain or changes in bowel or bladder habits.  No swelling, tenderness, numbness or tingling in his extremities at this time.  No falls or syncope.  He ambulates with a Rolator for added support.  Appetite and hydration are good.  Wt Readings from Last 3 Encounters:  06/18/22 (!) 324 lb 1.3 oz (147 kg)  04/17/22 (!) 326 lb (147.9 kg)  03/19/22 (!) 322 lb (146.1 kg)     ECOG Performance Status: 1 - Symptomatic but completely ambulatory  Medications:  Allergies as of 06/18/2022       Reactions   Diphenhydramine Hcl Swelling   Lips swell   Lisinopril Cough        Medication List        Accurate as of Jun 18, 2022 12:39 PM. If you have any questions, ask your nurse or doctor.          STOP taking these medications    metFORMIN 500 MG tablet Commonly known as: GLUCOPHAGE Stopped by: Rushie Chestnut, PA-C       TAKE these medications    amiodarone 200 MG tablet Commonly known as: PACERONE Take 1 tablet by mouth daily.   atorvastatin 80 MG tablet Commonly known as: LIPITOR TAKE 1 TABLET BY MOUTH EVERY DAY   carvedilol 12.5 MG tablet Commonly known as: COREG Take 1 tablet (12.5 mg total) by mouth 2 (two) times daily with a meal.   diclofenac Sodium 1 % Gel Commonly known as: VOLTAREN Apply 2 g topically 4 (four) times daily.   Eliquis 5 MG Tabs tablet Generic drug: apixaban TAKE 1 TABLET BY MOUTH TWICE A DAY   Entresto 49-51 MG Generic drug: sacubitril-valsartan Take 1 tablet by mouth 2 (two)  times daily.   fluticasone 50 MCG/ACT nasal spray Commonly known as: FLONASE Place 1 spray into both nostrils as needed for allergies or rhinitis.   furosemide 40 MG tablet Commonly known as: LASIX Take 1 tablet (40 mg total) by mouth daily.   Jardiance 10 MG Tabs tablet Generic drug: empagliflozin Take 1 tablet (10 mg total) by mouth daily.   Ozempic (0.25 or 0.5 MG/DOSE) 2 MG/3ML Sopn Generic drug: Semaglutide(0.25 or 0.5MG /DOS) Inject 0.5 mg into the skin once a week.   potassium chloride SA 20 MEQ tablet Commonly known as: KLOR-CON M Take 1 tablet (20 mEq total) by mouth daily.   spironolactone 25 MG tablet Commonly known as: ALDACTONE TAKE 1/2 TABLET BY MOUTH EVERY DAY   tadalafil 5 MG tablet Commonly known as: CIALIS Take 5 mg by mouth as directed.   Vitamin D3 10 MCG (400 UNIT) tablet Take 400 Units by mouth daily.        Allergies:  Allergies  Allergen Reactions   Diphenhydramine Hcl Swelling    Lips swell     Lisinopril Cough    Past Medical History, Surgical history, Social history, and Family History were reviewed and updated.  Review of Systems: All other 10 point review of systems  is negative.   Physical Exam:  height is 5\' 10"  (1.778 m) and weight is 324 lb 1.3 oz (147 kg) (abnormal). His oral temperature is 97.6 F (36.4 C). His blood pressure is 120/61 and his pulse is 50 (abnormal). His respiration is 19 and oxygen saturation is 99%.   Wt Readings from Last 3 Encounters:  06/18/22 (!) 324 lb 1.3 oz (147 kg)  04/17/22 (!) 326 lb (147.9 kg)  03/19/22 (!) 322 lb (146.1 kg)   General: Using a Rolator  Ocular: Sclerae unicteric, pupils equal, round and reactive to light Ear-nose-throat: Oropharynx clear, dentition fair Lymphatic: No cervical or supraclavicular adenopathy Lungs no rales or rhonchi, good excursion bilaterally Heart regular rate and rhythm, no murmur appreciated Abd soft, nontender, positive bowel sounds MSK no focal spinal  tenderness, no joint edema Neuro: non-focal, well-oriented, appropriate affect   Lab Results  Component Value Date   WBC 11.3 (H) 06/18/2022   HGB 13.3 06/18/2022   HCT 47.5 06/18/2022   MCV 69.3 (L) 06/18/2022   PLT 361 06/18/2022   Lab Results  Component Value Date   FERRITIN 7 (L) 05/01/2022   IRON 30 (L) 05/01/2022   TIBC 393 05/01/2022   UIBC 363 05/01/2022   IRONPCTSAT 8 (L) 05/01/2022   Lab Results  Component Value Date   RETICCTPCT 1.4 11/12/2021   RBC 6.85 (H) 06/18/2022   No results found for: "KPAFRELGTCHN", "LAMBDASER", "KAPLAMBRATIO" No results found for: "IGGSERUM", "IGA", "IGMSERUM" No results found for: "TOTALPROTELP", "ALBUMINELP", "A1GS", "A2GS", "BETS", "BETA2SER", "GAMS", "MSPIKE", "SPEI"   Chemistry      Component Value Date/Time   NA 141 06/18/2022 1041   NA 141 03/15/2022 0857   K 4.2 06/18/2022 1041   CL 108 06/18/2022 1041   CO2 27 06/18/2022 1041   BUN 11 06/18/2022 1041   BUN 13 03/15/2022 0857   CREATININE 1.28 (H) 06/18/2022 1041   CREATININE 1.11 09/09/2014 1017      Component Value Date/Time   CALCIUM 9.0 06/18/2022 1041   ALKPHOS 82 06/18/2022 1041   AST 34 06/18/2022 1041   ALT 55 (H) 06/18/2022 1041   BILITOT 0.7 06/18/2022 1041      Encounter Diagnoses  Name Primary?   JAK-2 gene mutation Yes   Erythrocytosis    Iron deficiency anemia due to chronic blood loss     Impression and Plan: Mr. Celedon is a very pleasant 62 yo African American gentleman with positive JAK 2.  Phlebotomy today for Hct 47.5%.  Lab check and phlebotomy every 6 weeks.    Disposition Phlebotomy today with fluid replacement RTC every 6 weeks labs(CBC, CMP, ferritin, iron)/Phlebotomy with fluid replacement  RTC 3 months APP, labs,(CBC, CMP, ferritin, iron) Phlebotomy with fluid replacement    Rushie Chestnut, PA-C 5/21/202412:39 PM

## 2022-06-18 NOTE — Progress Notes (Signed)
John Conner presents today for phlebotomy per MD orders. Phlebotomy procedure started at 11:40 and ended at 12:20. 504 cc removed via 20 G needle at R lateral AC site. IV replacement fluids given post phlebotomy. Patient tolerated procedure well. IV needle removed intact.

## 2022-06-18 NOTE — Patient Instructions (Signed)

## 2022-06-19 ENCOUNTER — Telehealth (HOSPITAL_COMMUNITY): Payer: Self-pay | Admitting: Pharmacy Technician

## 2022-06-19 ENCOUNTER — Other Ambulatory Visit (HOSPITAL_COMMUNITY): Payer: Self-pay

## 2022-06-19 NOTE — Telephone Encounter (Signed)
Patient Advocate Encounter   Received notification from [facility] that prior authorization for Jardiance is required.   PA submitted on CoverMyMeds Key [id number] Status is pending   Will continue to follow.

## 2022-06-20 ENCOUNTER — Other Ambulatory Visit (HOSPITAL_BASED_OUTPATIENT_CLINIC_OR_DEPARTMENT_OTHER): Payer: Self-pay

## 2022-06-21 ENCOUNTER — Other Ambulatory Visit (HOSPITAL_COMMUNITY): Payer: Self-pay

## 2022-06-21 NOTE — Telephone Encounter (Signed)
Advanced Heart Failure Patient Advocate Encounter  Prior Authorization for [place] has been approved.    PA# [id number] Effective dates: 06/19/22 through 06/19/23  Archer Asa, CPhT

## 2022-06-26 ENCOUNTER — Other Ambulatory Visit (HOSPITAL_COMMUNITY): Payer: Self-pay | Admitting: Internal Medicine

## 2022-07-05 ENCOUNTER — Encounter: Payer: Self-pay | Admitting: Family

## 2022-07-27 ENCOUNTER — Emergency Department (HOSPITAL_BASED_OUTPATIENT_CLINIC_OR_DEPARTMENT_OTHER): Payer: Medicaid Other

## 2022-07-27 ENCOUNTER — Other Ambulatory Visit: Payer: Self-pay

## 2022-07-27 ENCOUNTER — Encounter (HOSPITAL_BASED_OUTPATIENT_CLINIC_OR_DEPARTMENT_OTHER): Payer: Self-pay

## 2022-07-27 ENCOUNTER — Emergency Department (HOSPITAL_BASED_OUTPATIENT_CLINIC_OR_DEPARTMENT_OTHER)
Admission: EM | Admit: 2022-07-27 | Discharge: 2022-07-27 | Disposition: A | Discharge status: Home / Self Care | Payer: Medicaid Other | Attending: Emergency Medicine | Admitting: Emergency Medicine

## 2022-07-27 DIAGNOSIS — R7989 Other specified abnormal findings of blood chemistry: Secondary | ICD-10-CM | POA: Diagnosis not present

## 2022-07-27 DIAGNOSIS — E119 Type 2 diabetes mellitus without complications: Secondary | ICD-10-CM | POA: Insufficient documentation

## 2022-07-27 DIAGNOSIS — Z7901 Long term (current) use of anticoagulants: Secondary | ICD-10-CM | POA: Insufficient documentation

## 2022-07-27 DIAGNOSIS — Z79899 Other long term (current) drug therapy: Secondary | ICD-10-CM | POA: Insufficient documentation

## 2022-07-27 DIAGNOSIS — Z7984 Long term (current) use of oral hypoglycemic drugs: Secondary | ICD-10-CM | POA: Diagnosis not present

## 2022-07-27 DIAGNOSIS — D72829 Elevated white blood cell count, unspecified: Secondary | ICD-10-CM | POA: Diagnosis not present

## 2022-07-27 DIAGNOSIS — R519 Headache, unspecified: Secondary | ICD-10-CM | POA: Insufficient documentation

## 2022-07-27 DIAGNOSIS — Z794 Long term (current) use of insulin: Secondary | ICD-10-CM | POA: Insufficient documentation

## 2022-07-27 DIAGNOSIS — R0789 Other chest pain: Secondary | ICD-10-CM | POA: Insufficient documentation

## 2022-07-27 LAB — CBC
HCT: 47.3 % (ref 39.0–52.0)
Hemoglobin: 13.1 g/dL (ref 13.0–17.0)
MCH: 18.8 pg — ABNORMAL LOW (ref 26.0–34.0)
MCHC: 27.7 g/dL — ABNORMAL LOW (ref 30.0–36.0)
MCV: 68 fL — ABNORMAL LOW (ref 80.0–100.0)
Platelets: 341 10*3/uL (ref 150–400)
RBC: 6.96 MIL/uL — ABNORMAL HIGH (ref 4.22–5.81)
RDW: 22.1 % — ABNORMAL HIGH (ref 11.5–15.5)
WBC: 11.7 10*3/uL — ABNORMAL HIGH (ref 4.0–10.5)
nRBC: 0 % (ref 0.0–0.2)

## 2022-07-27 LAB — BASIC METABOLIC PANEL
Anion gap: 12 (ref 5–15)
BUN: 18 mg/dL (ref 8–23)
CO2: 21 mmol/L — ABNORMAL LOW (ref 22–32)
Calcium: 8.8 mg/dL — ABNORMAL LOW (ref 8.9–10.3)
Chloride: 102 mmol/L (ref 98–111)
Creatinine, Ser: 1.29 mg/dL — ABNORMAL HIGH (ref 0.61–1.24)
GFR, Estimated: >60 mL/min (ref >60)
Glucose, Bld: 87 mg/dL (ref 70–99)
Potassium: 3.8 mmol/L (ref 3.5–5.1)
Sodium: 135 mmol/L (ref 135–145)

## 2022-07-27 LAB — TROPONIN I (HIGH SENSITIVITY): Troponin I (High Sensitivity): 11 ng/L (ref <18)

## 2022-07-27 NOTE — ED Provider Notes (Signed)
[facility] Provider Note   CSN: [id number] Arrival date & time: 07/27/22  1654     History  Chief Complaint  Patient presents with   Headache   Chest Pain    John Conner is a 62 y.o. male.  Patient with history of congestive heart failure on furosemide and compliant, history of diabetes --presents to the emergency department today for evaluation of headache as well as chest pain.  Patient had intermittent headaches described as a tightness in the back of the head, sometimes on the side of the head, lasting for 10 to 15 minutes.  No noted triggers.  These are new pains for the patient.  Patient denies signs of stroke including: facial droop, slurred speech, aphasia, weakness/numbness in extremities, imbalance/trouble walking.  No vision change.  Denies any falls or injuries.  No radiation of pain into the neck.  In regards to heart failure, he feels this is well-controlled.  He has not had any worsening shortness of breath or lower extremity swelling which is common when his heart failure is worsening.  Patient also has had 2 days of intermittent chest pain.  Pain was worse earlier today at 6.5 out of 10, currently 4 out of 10.  No associated cough or shortness of breath.  No fevers.  No nausea or vomiting.  No diaphoresis.  Patient has physical therapy in a pool once a week and works out at [employer] on another day of the week.  He has not had recurrent symptoms with this.  He has had similar pains in the past thought to be chest wall pain.  Pain is worse with palpation of the left chest.  No skin rashes.       Home Medications Prior to Admission medications   Medication Sig Start Date End Date Taking? Authorizing Provider  amiodarone (PACERONE) 200 MG tablet Take 1 tablet by mouth daily.    [provider]  atorvastatin (LIPITOR) 80 MG tablet TAKE 1 TABLET BY MOUTH EVERY DAY 03/19/22   Duke Salvia, MD  carvedilol (COREG) 12.5  MG tablet Take 1 tablet (12.5 mg total) by mouth 2 (two) times daily with a meal. 10/23/21   Allayne Butcher, PA-C  Cholecalciferol (VITAMIN D3) 10 MCG (400 UNIT) tablet Take 400 Units by mouth daily.    [provider]  diclofenac Sodium (VOLTAREN) 1 % GEL Apply 2 g topically 4 (four) times daily. 11/06/21   Rana Snare, DO  ELIQUIS 5 MG TABS tablet TAKE 1 TABLET BY MOUTH TWICE A DAY 03/28/22   Bensimhon, Bevelyn Buckles, MD  empagliflozin (JARDIANCE) 10 MG TABS tablet Take 1 tablet (10 mg total) by mouth daily. 06/18/22   Clegg, Amy D, NP  fluticasone (FLONASE) 50 MCG/ACT nasal spray Place 1 spray into both nostrils as needed for allergies or rhinitis.    [provider]  furosemide (LASIX) 40 MG tablet Take 1 tablet (40 mg total) by mouth daily. 04/17/22   Duke Salvia, MD  potassium chloride SA (KLOR-CON M) 20 MEQ tablet Take 1 tablet (20 mEq total) by mouth daily. 02/08/21   Zannie Cove, MD  sacubitril-valsartan (ENTRESTO) 49-51 MG Take 1 tablet by mouth 2 (two) times daily. 12/14/21   Duke Salvia, MD  Semaglutide,0.25 or 0.5MG /DOS, (OZEMPIC, 0.25 OR 0.5 MG/DOSE,) 2 MG/3ML SOPN Inject 0.5 mg into the skin once a week. 06/17/22   [provider]  spironolactone (ALDACTONE) 25 MG tablet Take 0.5 tablets (  12.5 mg total) by mouth daily. NEEDS FOLLOW UP APPOINTMENT FOR MORE REFILLS 06/26/22   Bensimhon, Bevelyn Buckles, MD  tadalafil (CIALIS) 5 MG tablet Take 5 mg by mouth as directed. 02/12/22   [provider]      Allergies    Diphenhydramine hcl and Lisinopril    Review of Systems   Review of Systems  Physical Exam Updated Vital Signs BP 116/73 (BP Location: Left Arm)   Pulse 61   Temp 98.2 F (36.8 C) (Oral)   Resp (!) 24   Ht 5\' 10"  (1.778 m)   Wt (!) 145.2 kg   SpO2 96%   BMI 45.92 kg/m   Physical Exam Vitals and nursing note reviewed.  Constitutional:      Appearance: He is well-developed. He is not diaphoretic.  HENT:     Head:  Normocephalic and atraumatic.     Right Ear: External ear normal.     Left Ear: External ear normal.     Nose: Nose normal.     Mouth/Throat:     Mouth: Mucous membranes are not dry.     Pharynx: Uvula midline.  Eyes:     General: Lids are normal.     Conjunctiva/sclera: Conjunctivae normal.     Pupils: Pupils are equal, round, and reactive to light.  Neck:     Vascular: Normal carotid pulses. No carotid bruit or JVD.     Trachea: Trachea normal. No tracheal deviation.  Cardiovascular:     Rate and Rhythm: Normal rate and regular rhythm.     Pulses: No decreased pulses.          Radial pulses are 2+ on the right side and 2+ on the left side.     Heart sounds: Normal heart sounds, S1 normal and S2 normal. Heart sounds not distant. No murmur heard. Pulmonary:     Effort: Pulmonary effort is normal. No respiratory distress.     Breath sounds: Normal breath sounds. No wheezing.  Chest:     Chest wall: No tenderness.       Comments: Reproducible chest wall tenderness to palpation Abdominal:     General: Bowel sounds are normal.     Palpations: Abdomen is soft.     Tenderness: There is no abdominal tenderness. There is no guarding or rebound.  Musculoskeletal:        General: Normal range of motion.     Cervical back: Normal range of motion and neck supple. No tenderness or bony tenderness. No muscular tenderness.     Right lower leg: No edema.     Left lower leg: No edema.  Skin:    General: Skin is warm and dry.     Coloration: Skin is not pale.  Neurological:     Mental Status: He is alert and oriented to person, place, and time. Mental status is at baseline.     GCS: GCS eye subscore is 4. GCS verbal subscore is 5. GCS motor subscore is 6.     Cranial Nerves: No cranial nerve deficit.     Sensory: No sensory deficit.     Motor: No abnormal muscle tone.     Coordination: Coordination normal.  Psychiatric:        Mood and Affect: Mood normal.     ED Results / Procedures  / Treatments   Labs (all labs ordered are listed, but only abnormal results are displayed) Labs Reviewed  BASIC METABOLIC PANEL - Abnormal; Notable for the following components:  Result Value   CO2 21 (*)    Creatinine, Ser 1.29 (*)    Calcium 8.8 (*)    All other components within normal limits  CBC - Abnormal; Notable for the following components:   WBC 11.7 (*)    RBC 6.96 (*)    MCV 68.0 (*)    MCH 18.8 (*)    MCHC 27.7 (*)    RDW 22.1 (*)    All other components within normal limits  TROPONIN I (HIGH SENSITIVITY)      Radiology DG Chest 2 View  Result Date: 07/27/2022 CLINICAL DATA:  Chest pain for 3 days. EXAM: CHEST - 2 VIEW COMPARISON:  Chest radiographs 03/12/2022 and 02/06/2022 FINDINGS: Cardiac silhouette and mediastinal contours are within normal limits. The lungs are clear. No pleural effusion or pneumothorax. Mild multilevel degenerative disc changes of the thoracic spine. IMPRESSION: No active cardiopulmonary disease. Electronically Signed   By: Neita Garnet M.D.   On: 07/27/2022 17:59    Procedures Procedures    Medications Ordered in ED Medications - No data to display  ED Course/ Medical Decision Making/ A&P    Patient seen and examined. History obtained directly from patient. Work-up including labs, imaging, EKG ordered in triage, if performed, were reviewed.    Labs/EKG: Independently reviewed and interpreted.  This included: CBC with mildly elevated white blood cell count 11.7, normal hemoglobin low at 68 otherwise unremarkable; BMP with creatinine minimally elevated at 1.29 with a BUN of 18, normal electrolytes, otherwise unremarkable; troponin normal at 11.  EKG personally reviewed and interpreted.  Imaging: Independently visualized and interpreted.  This included: Chest x-ray  Medications/Fluids: None ordered  Most recent vital signs reviewed and are as follows: BP 116/73 (BP Location: Left Arm)   Pulse 61   Temp 98.2 F (36.8 C) (Oral)    Resp (!) 24   Ht 5\' 10"  (1.778 m)   Wt (!) 145.2 kg   SpO2 96%   BMI 45.92 kg/m   Initial impression: Headache without red flags other than age and this being a little headache for the patient.  Will obtain CT head.  Patient has a normal neurologic exam and does not exhibit any stroke symptoms.  Chest pain, in the setting compensated CHF.  Exam suggestive of chest wall pain as patient is very tender and pain is reproducible with palpation over the left chest wall.  Will ensure no suggestive ACS.  Patient looks very well, comfortable.  6:41 PM Signout to Small PA-C at shift change pending results to CT.   Plan: Cardiac work-up is reassuring and given atypical nature of symptoms, do not feel he requires more work-up today.   If head CT is neg, feel HA is low-risk and would not need transfer for MRI or CTA given normal and reassuring exam.                             Medical Decision Making Amount and/or Complexity of Data Reviewed Labs: ordered. Radiology: ordered.   In regards to the patient's headache, critical differentials were considered including subarachnoid hemorrhage, intracerebral hemorrhage, epidural/subdural hematoma, pituitary apoplexy, vertebral/carotid artery dissection, giant cell arteritis, central venous thrombosis, reversible cerebral vasoconstriction, acute angle closure glaucoma, idiopathic intracranial hypertension, bacterial meningitis, viral encephalitis, carbon monoxide poisoning, posterior reversible encephalopathy syndrome, pre-eclampsia.   Reg flag symptoms related to these causes were considered including systemic symptoms (fever, weight loss), neurologic symptoms (confusion, mental status change, vision  change, associated seizure), acute or sudden "thunderclap" onset, patient age 65 or older with new or progressive headache, patient of any age with first headache or change in headache pattern, pregnant or postpartum status, history of HIV or other  immunocompromise, history of cancer, headache occurring with exertion, associated neck or shoulder pain, associated traumatic injury, concurrent use of anticoagulation, family history of spontaneous SAH, and concurrent drug use.    Other benign, more common causes of headache were considered including migraine, tension-type headache, cluster headache, referred pain from other cause such as sinus infection, dental pain, trigeminal neuralgia.   On exam, patient has a reassuring neuro exam including baseline mental status, no significant neck pain or meningeal signs, no signs of severe infection or fever.   For this patient's complaint of chest pain, the following emergent conditions were considered on the differential diagnosis: acute coronary syndrome, pulmonary embolism, pneumothorax, myocarditis, pericardial tamponade, aortic dissection, thoracic aortic aneurysm complication, esophageal perforation.   Other causes were also considered including: gastroesophageal reflux disease, musculoskeletal pain including costochondritis, pneumonia/pleurisy, herpes zoster, pericarditis.  In regards to possibility of ACS, patient has atypical features of pain, non-ischemic and unchanged EKG and negative troponin. Symptoms are readily reproducible with palpation of the chest wall and very atypical for ACS, PE, pneumothorax, dissection.  The patient's vital signs, pertinent lab work and imaging were reviewed and interpreted as discussed in the ED course. Hospitalization was considered for further testing, treatments, or serial exams/observation. However as patient is well-appearing, has a stable exam over the course of their evaluation, and reassuring studies today, I do not feel that they warrant admission at this time. This plan was discussed with the patient who verbalizes agreement and comfort with this plan and seems reliable and able to return to the Emergency Department with worsening or changing symptoms.           Final Clinical Impression(s) / ED Diagnoses Final diagnoses:  Acute nonintractable headache, unspecified headache type  Atypical chest pain    Rx / DC Orders ED Discharge Orders     None         Renne Crigler, PA-C 07/27/22 1843    Gwyneth Sprout, MD 07/27/22 2252

## 2022-07-27 NOTE — ED Triage Notes (Signed)
Patient having a headache and chest pain that comes and goes for three days.

## 2022-07-27 NOTE — Discharge Instructions (Addendum)
Please read and follow all provided instructions.  Your diagnoses today include:  1. Acute nonintractable headache, unspecified headache type   2. Atypical chest pain     Tests performed today include: CT of your head which was normal and did not show any serious cause of your headache Blood cell counts and electrolytes Cardiac enzyme for stress on the heart was normal Vital signs. See below for your results today.   Medications:  None  Take any prescribed medications only as directed.  Additional information:  Follow any educational materials contained in this packet.  You are having a headache. No specific cause was found today for your headache. It may have been a migraine or other cause of headache. Stress, anxiety, fatigue, and depression are common triggers for headaches.   Your headache today does not appear to be life-threatening or require hospitalization, but often the exact cause of headaches is not determined in the emergency department. Therefore, follow-up with your doctor is very important to find out what may have caused your headache and whether or not you need any further diagnostic testing or treatment.   Sometimes headaches can appear benign (not harmful), but then more serious symptoms can develop which should prompt an immediate re-evaluation by your doctor or the emergency department.  BE VERY CAREFUL not to take multiple medicines containing Tylenol (also called acetaminophen). Doing so can lead to an overdose which can damage your liver and cause liver failure and possibly death.   Follow-up instructions: Please follow-up with your primary care provider in the next 3 days for further evaluation of your symptoms.   Return instructions:  Please return to the Emergency Department if you experience worsening symptoms. Return if the medications do not resolve your headache, if it recurs, or if you have multiple episodes of vomiting or cannot keep down  fluids. Return if you have a change from the usual headache. Return if you have worsening chest pain, shortness of breath especially if it is associated with vomiting or heavy sweating RETURN IMMEDIATELY IF you: Develop a sudden, severe headache Develop confusion or become poorly responsive or faint Develop a fever above 100.76F or problem breathing Have a change in speech, vision, swallowing, or understanding Develop new weakness, numbness, tingling, incoordination in your arms or legs Have a seizure Please return if you have any other emergent concerns.  Additional Information:  Your vital signs today were: BP 116/73 (BP Location: Left Arm)   Pulse 61   Temp 98.2 F (36.8 C) (Oral)   Resp (!) 24   Ht 5\' 10"  (1.778 m)   Wt (!) 145.2 kg   SpO2 96%   BMI 45.92 kg/m  If your blood pressure (BP) was elevated above 135/85 this visit, please have this repeated by your doctor within one month. --------------

## 2022-07-27 NOTE — ED Provider Notes (Signed)
Received handoff from Felicita Gage PA. Recurrent headaches for last 3 days. No neurodeficits.  Cleared on chest pain work-up, has reproducible chest pain.   Physical Exam  BP 116/73 (BP Location: Left Arm)   Pulse 61   Temp 98.2 F (36.8 C) (Oral)   Resp (!) 24   Ht 5\' 10"  (1.778 m)   Wt (!) 145.2 kg   SpO2 96%   BMI 45.92 kg/m   Physical Exam Vitals and nursing note reviewed.  Constitutional:      General: He is not in acute distress.    Appearance: He is well-developed.  HENT:     Head: Normocephalic and atraumatic.  Eyes:     Conjunctiva/sclera: Conjunctivae normal.  Cardiovascular:     Rate and Rhythm: Normal rate and regular rhythm.     Heart sounds: No murmur heard. Pulmonary:     Effort: Pulmonary effort is normal. No respiratory distress.     Breath sounds: Normal breath sounds.  Abdominal:     Palpations: Abdomen is soft.     Tenderness: There is no abdominal tenderness.  Musculoskeletal:        General: No swelling.     Cervical back: Neck supple.  Skin:    General: Skin is warm and dry.     Capillary Refill: Capillary refill takes less than 2 seconds.  Neurological:     Mental Status: He is alert.  Psychiatric:        Mood and Affect: Mood normal.     Procedures  Procedures  ED Course / MDM    Medical Decision Making Amount and/or Complexity of Data Reviewed Labs: ordered. Radiology: ordered.  Patient is a 62 year old male, here for chest pain, headache, further evaluated by Rhea Bleacher, PA, he cleared patient for chest pain, however patient patient was informed that he will need to follow-up with his primary care doctor.  Additionally head CT unremarkable.  Need to follow-up with primary care doctor.  Discussed return precautions patient voiced understanding.       Pete Pelt, [place] 07/27/22 1953    Gwyneth Sprout, MD 07/27/22 2253

## 2022-07-30 ENCOUNTER — Inpatient Hospital Stay: Payer: Medicaid Other

## 2022-07-30 ENCOUNTER — Inpatient Hospital Stay: Payer: Medicaid Other | Attending: Hematology & Oncology

## 2022-07-30 VITALS — BP 98/56 | HR 57 | Temp 98.0°F | Resp 17

## 2022-07-30 DIAGNOSIS — D751 Secondary polycythemia: Secondary | ICD-10-CM | POA: Insufficient documentation

## 2022-07-30 DIAGNOSIS — Z1589 Genetic susceptibility to other disease: Secondary | ICD-10-CM

## 2022-07-30 DIAGNOSIS — D500 Iron deficiency anemia secondary to blood loss (chronic): Secondary | ICD-10-CM

## 2022-07-30 LAB — CMP (CANCER CENTER ONLY)
ALT: 38 U/L (ref 0–44)
AST: 25 U/L (ref 15–41)
Albumin: 4.4 g/dL (ref 3.5–5.0)
Alkaline Phosphatase: 79 U/L (ref 38–126)
Anion gap: 8 (ref 5–15)
BUN: 16 mg/dL (ref 8–23)
CO2: 24 mmol/L (ref 22–32)
Calcium: 9.3 mg/dL (ref 8.9–10.3)
Chloride: 107 mmol/L (ref 98–111)
Creatinine: 1.38 mg/dL — ABNORMAL HIGH (ref 0.61–1.24)
GFR, Estimated: 58 mL/min — ABNORMAL LOW (ref >60)
Glucose, Bld: 115 mg/dL — ABNORMAL HIGH (ref 70–99)
Potassium: 4.5 mmol/L (ref 3.5–5.1)
Sodium: 139 mmol/L (ref 135–145)
Total Bilirubin: 0.8 mg/dL (ref 0.3–1.2)
Total Protein: 7.5 g/dL (ref 6.5–8.1)

## 2022-07-30 LAB — CBC WITH DIFFERENTIAL (CANCER CENTER ONLY)
Abs Immature Granulocytes: 0.03 10*3/uL (ref 0.00–0.07)
Basophils Absolute: 0.1 10*3/uL (ref 0.0–0.1)
Basophils Relative: 1 %
Eosinophils Absolute: 0.2 10*3/uL (ref 0.0–0.5)
Eosinophils Relative: 2 %
HCT: 48.1 % (ref 39.0–52.0)
Hemoglobin: 13.2 g/dL (ref 13.0–17.0)
Immature Granulocytes: 0 %
Lymphocytes Relative: 40 %
Lymphs Abs: 4.5 10*3/uL — ABNORMAL HIGH (ref 0.7–4.0)
MCH: 18.9 pg — ABNORMAL LOW (ref 26.0–34.0)
MCHC: 27.4 g/dL — ABNORMAL LOW (ref 30.0–36.0)
MCV: 68.9 fL — ABNORMAL LOW (ref 80.0–100.0)
Monocytes Absolute: 0.7 10*3/uL (ref 0.1–1.0)
Monocytes Relative: 7 %
Neutro Abs: 5.6 10*3/uL (ref 1.7–7.7)
Neutrophils Relative %: 50 %
Platelet Count: 342 10*3/uL (ref 150–400)
RBC: 6.98 MIL/uL — ABNORMAL HIGH (ref 4.22–5.81)
RDW: 22 % — ABNORMAL HIGH (ref 11.5–15.5)
WBC Count: 11.2 10*3/uL — ABNORMAL HIGH (ref 4.0–10.5)
nRBC: 0 % (ref 0.0–0.2)

## 2022-07-30 MED ORDER — SODIUM CHLORIDE 0.9 % IV SOLN: Freq: Once | INTRAVENOUS | Status: AC

## 2022-07-30 NOTE — Progress Notes (Signed)
John Conner presents today for phlebotomy per MD orders. Phlebotomy procedure started at 1105 and ended at 1133. 554 grams removed via 20 gauge needle to right AC. Patient observed for 30 minutes after procedure without any incident. Patient tolerated procedure well. IV needle removed intact.

## 2022-07-30 NOTE — Progress Notes (Signed)
1210  Patient completed 500 cc normal saline at 999 cc/hr.  BP 87/50.  Patient asymptomatic.  Patient up to the BR felt "little lightheaded but ok"  Additional 250 cc normal hung on patient.

## 2022-07-30 NOTE — Patient Instructions (Signed)

## 2022-08-16 ENCOUNTER — Other Ambulatory Visit (HOSPITAL_BASED_OUTPATIENT_CLINIC_OR_DEPARTMENT_OTHER): Payer: Self-pay

## 2022-08-16 ENCOUNTER — Other Ambulatory Visit (HOSPITAL_COMMUNITY): Payer: Self-pay | Admitting: Adult Health

## 2022-08-16 MED ORDER — EMPAGLIFLOZIN 10 MG PO TABS
10.0000 mg | ORAL_TABLET | Freq: Every day | ORAL | 0 refills | Status: DC
  Filled 2022-08-16: qty 60, 60d supply, fill #0

## 2022-08-21 IMAGING — CT CT HEAD W/O CM
3 series · 14 of 47 positions shown, 16 images · non-contrast
Comparison: 05/10/2021

CLINICAL DATA: Headache.  Intermittent dizziness.



[Series 2: head wo · axial · 0.44mm/px · z∈[+956,+1086]mm · 8 of 32 slices shown, 10 images]
[im 3/32  brain]
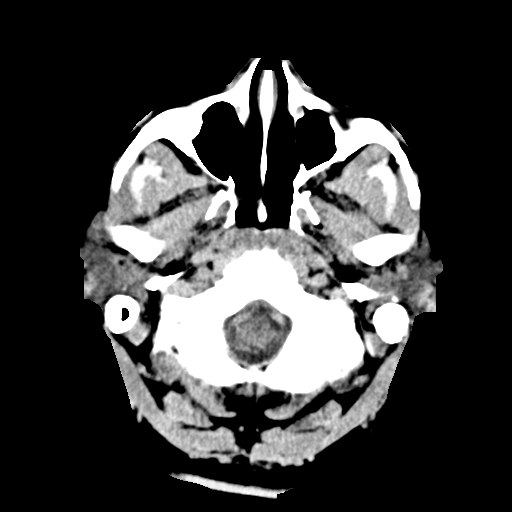
[im 3/32  bone]
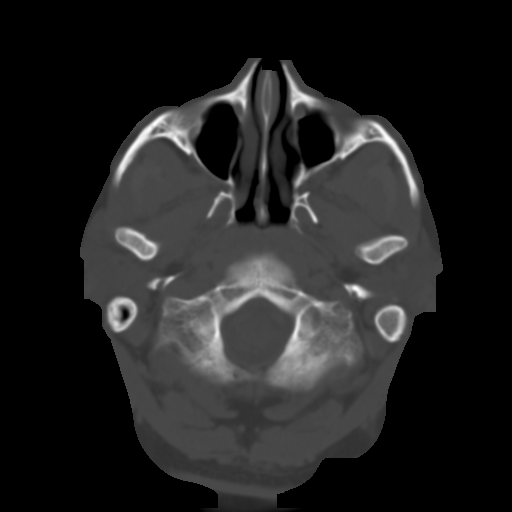
[im 7/32  brain]
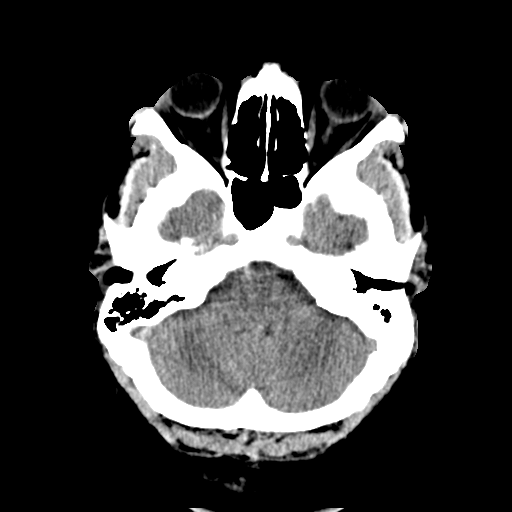
[im 10/32  brain]
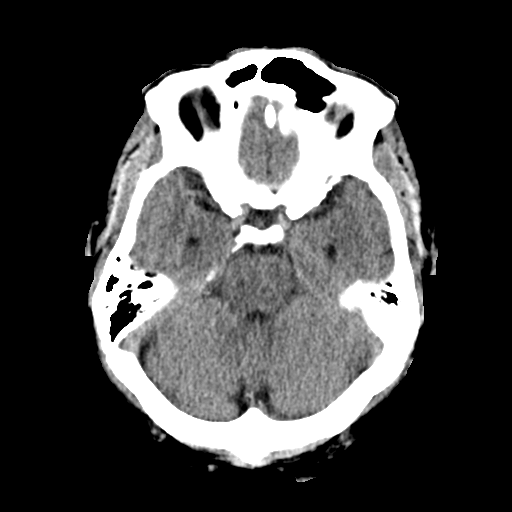
[im 14/32  brain]
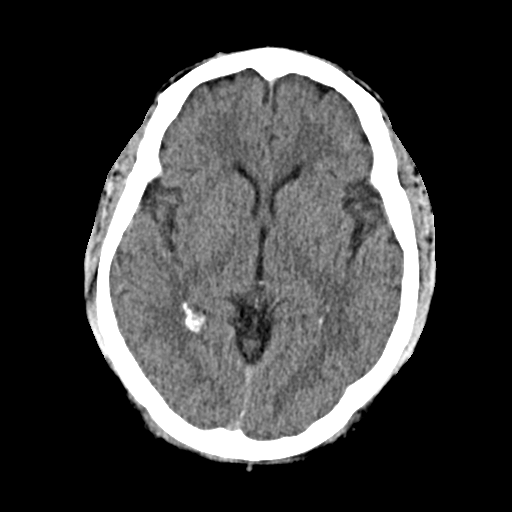
[im 18/32  brain]
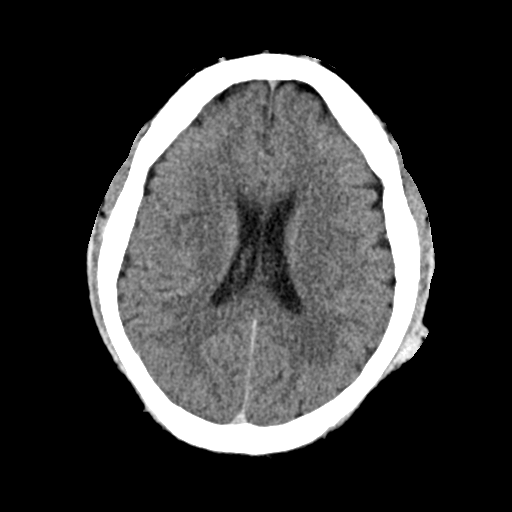
[im 18/32  bone]
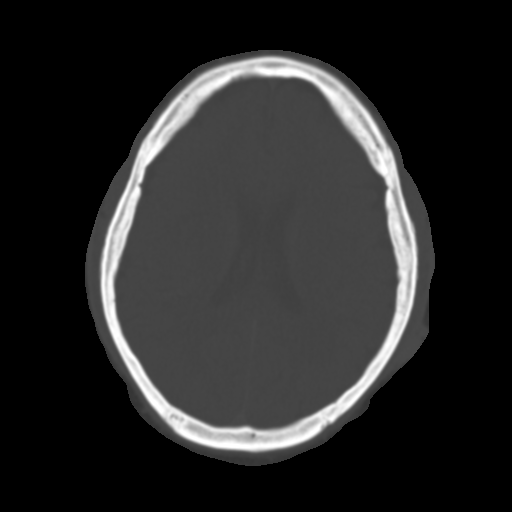
[im 22/32  brain]
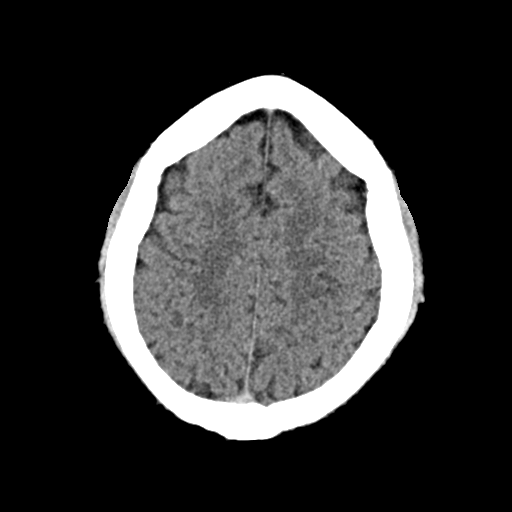
[im 25/32  brain]
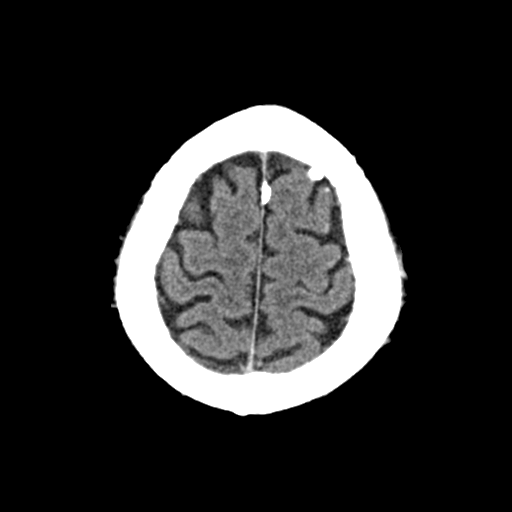
[im 29/32  brain]
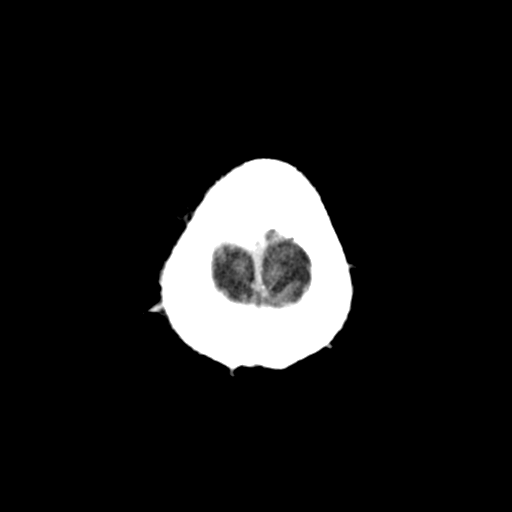

[Series 4: coronal soft · coronal · 0.31mm/px · 3 of 72 slices shown]
[im 24/72  brain]
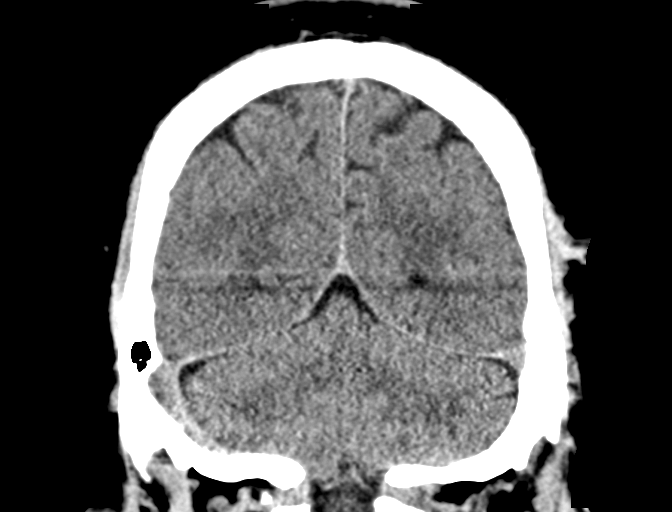
[im 32/72  brain]
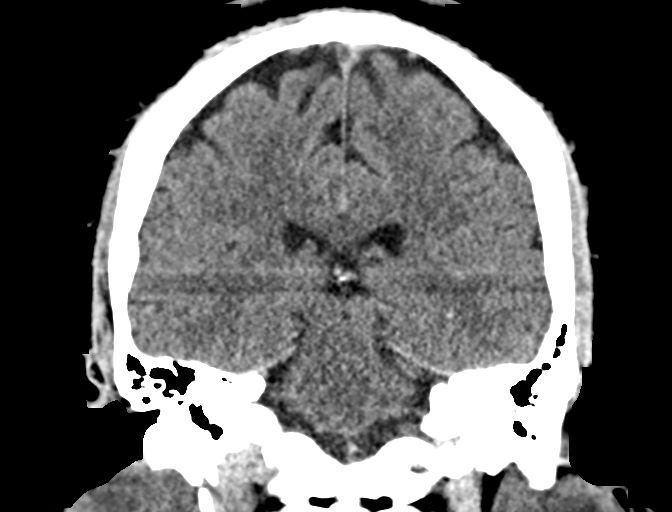
[im 40/72  brain]
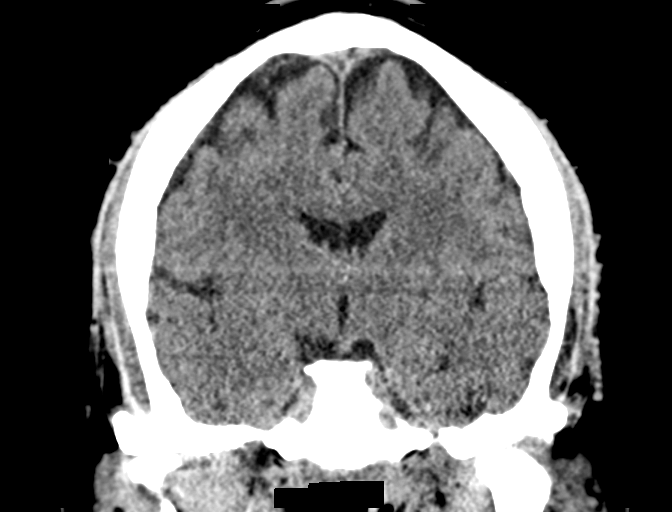

[Series 5: sag soft · sagittal · 0.31mm/px · 3 of 62 slices shown]
[im 21/62  brain]
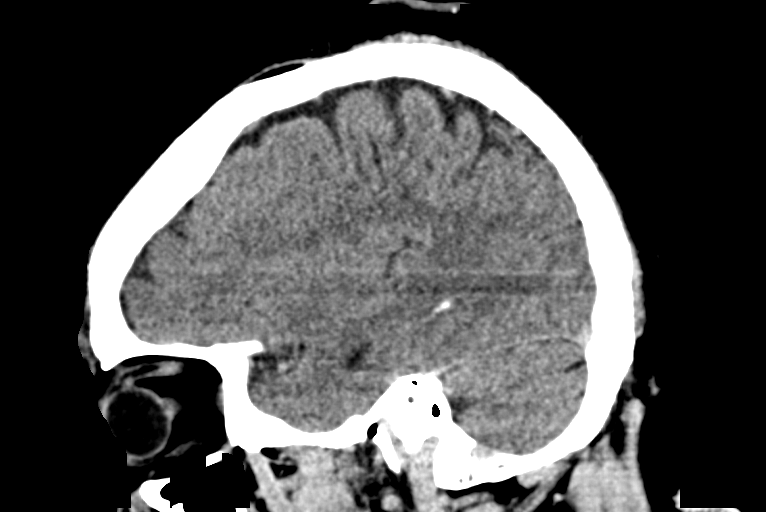
[im 31/62  brain]
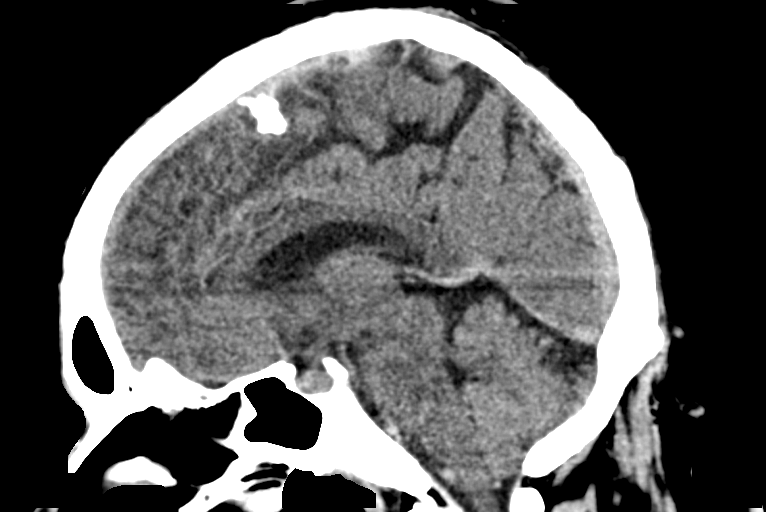
[im 41/62  brain]
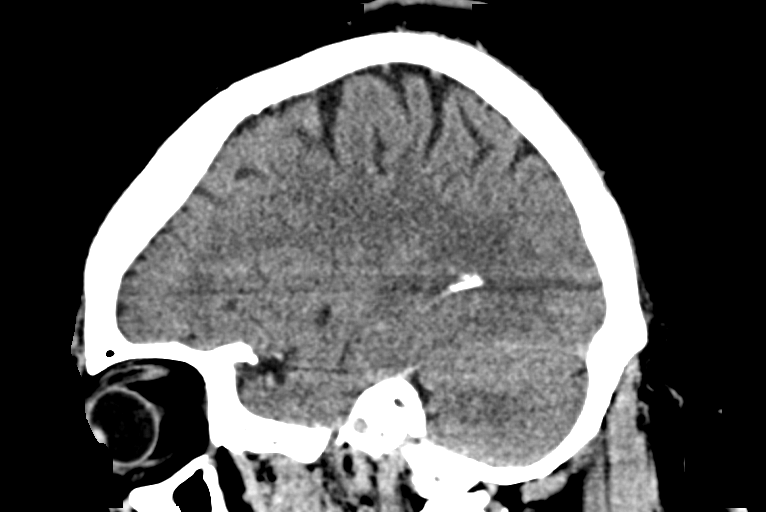

[14 of 47 positions shown; findings below may reference images not displayed]

FINDINGS: Brain: No evidence of acute infarction, hemorrhage, hydrocephalus,
extra-axial collection or mass lesion/mass effect.

Vascular: No hyperdense vessel or unexpected calcification.

Skull: Normal. Negative for fracture or focal lesion.

Sinuses/Orbits: Globes and orbits are unremarkable. Visualized
sinuses are clear.

Other: None.
IMPRESSION: Normal unenhanced CT scan of the brain.

## 2022-08-26 DIAGNOSIS — I493 Ventricular premature depolarization: Secondary | ICD-10-CM | POA: Insufficient documentation

## 2022-08-29 ENCOUNTER — Encounter: Payer: Self-pay | Admitting: Internal Medicine

## 2022-08-29 ENCOUNTER — Ambulatory Visit: Payer: Medicaid Other

## 2022-08-29 ENCOUNTER — Ambulatory Visit: Payer: Medicaid Other | Attending: Internal Medicine | Admitting: Internal Medicine

## 2022-08-29 VITALS — BP 114/76 | HR 70 | Ht 70.0 in | Wt 319.6 lb

## 2022-08-29 DIAGNOSIS — I428 Other cardiomyopathies: Secondary | ICD-10-CM | POA: Diagnosis not present

## 2022-08-29 DIAGNOSIS — Z79899 Other long term (current) drug therapy: Secondary | ICD-10-CM | POA: Diagnosis not present

## 2022-08-29 DIAGNOSIS — I4819 Other persistent atrial fibrillation: Secondary | ICD-10-CM

## 2022-08-29 DIAGNOSIS — I493 Ventricular premature depolarization: Secondary | ICD-10-CM

## 2022-08-29 DIAGNOSIS — E039 Hypothyroidism, unspecified: Secondary | ICD-10-CM

## 2022-08-29 MED ORDER — ENTRESTO 49-51 MG PO TABS: 1.0000 | ORAL_TABLET | Freq: Two times a day (BID) | ORAL | 3 refills | Status: DC

## 2022-08-29 MED ORDER — AMIODARONE HCL 200 MG PO TABS: ORAL_TABLET | ORAL | 1 refills | Status: DC

## 2022-08-29 NOTE — Progress Notes (Unsigned)
Enrolled for [employer] to mail a ZIO XT long term holter monitor to the patients address on file.   Requested delivery date of 11/29/2022.

## 2022-08-29 NOTE — Patient Instructions (Addendum)
Medication Instructions:  Your physician has recommended you make the following change in your medication:   ** Begin taking Entresto 24/26 - 1 tablet every morning and 2 tablets every evening x 2 weeks Then increase 2 tablets by moth twice daily x 2 weeks then you will refill your Entresto at 49/51mg  - 1 tablet by mouth twice daily  ** Decrease Amiodarone 200mg  to 1 tablet by mouth Monday - Friday  *If you need a refill on your cardiac medications before your next appointment, please call your pharmacy*   Lab Work:  TSH  If you have labs (blood work) drawn today and your tests are completely normal, you will receive your results only by: MyChart Message (if you have MyChart) OR A paper copy in the mail If you have any lab test that is abnormal or we need to change your treatment, we will call you to review the results.   Testing/Procedures: Your physician has requested that you have an echocardiogram. Echocardiography is a painless test that uses sound waves to create images of your heart. It provides your doctor with information about the size and shape of your heart and how well your heart's chambers and valves are working. This procedure takes approximately one hour. There are no restrictions for this procedure. Please do NOT wear cologne, perfume, aftershave, or lotions (deodorant is allowed). Please arrive 15 minutes prior to your appointment time.  ZIO XT- Long Term Monitor Instructions  - Due to wear Novemebr 2024  Your physician has requested you wear a ZIO patch monitor for 3 days.  This is a single patch monitor. [employer] supplies one patch monitor per enrollment. Additional stickers are not available. Please do not apply patch if you will be having a Nuclear Stress Test,  Echocardiogram, Cardiac CT, MRI, or Chest Xray during the period you would be wearing the  monitor. The patch cannot be worn during these tests. You cannot remove and re-apply the  ZIO XT patch monitor.   Your ZIO patch monitor will be mailed 3 day USPS to your address on file. It may take 3-5 days  to receive your monitor after you have been enrolled.  Once you have received your monitor, please review the enclosed instructions. Your monitor  has already been registered assigning a specific monitor serial # to you.  Billing and Patient Assistance Program Information  We have supplied [employer] with any of your insurance information on file for billing purposes. [employer] offers a sliding scale Patient Assistance Program for patients that do not have  insurance, or whose insurance does not completely cover the cost of the ZIO monitor.  You must apply for the Patient Assistance Program to qualify for this discounted rate.  To apply, please call [employer] at [phone], select option 4, select option 2, ask to apply for  Patient Assistance Program. Meredeth Ide will ask your household income, and how many people  are in your household. They will quote your out-of-pocket cost based on that information.  [employer] will also be able to set up a 72-month, interest-free payment plan if needed.  Applying the monitor   Shave hair from upper left chest.  Hold abrader disc by orange tab. Rub abrader in 40 strokes over the upper left chest as  indicated in your monitor instructions.  Clean area with 4 enclosed alcohol pads. Let dry.  Apply patch as indicated in monitor instructions. Patch will be placed under collarbone on left  side of chest with arrow pointing upward.  Rub patch adhesive wings for 2 minutes. Remove white label marked "1". Remove the white  label marked "2". Rub patch adhesive wings for 2 additional minutes.  While looking in a mirror, press and release button in center of patch. A small green light will  flash 3-4 times. This will be your only indicator that the monitor has been turned on.  Do not shower for the first 24 hours. You may shower after the first 24 hours.  Press the button if  you feel a symptom. You will hear a small click. Record Date, Time and  Symptom in the Patient Logbook.  When you are ready to remove the patch, follow instructions on the last 2 pages of Patient  Logbook. Stick patch monitor onto the last page of Patient Logbook.  Place Patient Logbook in the blue and white box. Use locking tab on box and tape box closed  securely. The blue and white box has prepaid postage on it. Please place it in the mailbox as  soon as possible. Your physician should have your test results approximately 7 days after the  monitor has been mailed back to [facility].  Call [facility] Customer Care at [phone] if you have questions regarding  your ZIO XT patch monitor. Call them immediately if you see an orange light blinking on your  monitor.  If your monitor falls off in less than 4 days, contact our Monitor department at [phone].  If your monitor becomes loose or falls off after 4 days call [employer] at [phone] for  suggestions on securing your monitor    Follow-Up: At [facility], you and your health needs are our priority.  As part of our continuing mission to provide you with exceptional heart care, we have created designated Provider Care Teams.  These Care Teams include your primary Cardiologist (physician) and Advanced Practice Providers (APPs -  Physician Assistants and Nurse Practitioners) who all work together to provide you with the care you need, when you need it.  We recommend signing up for the patient portal called "MyChart".  Sign up information is provided on this After Visit Summary.  MyChart is used to connect with patients for Virtual Visits (Telemedicine).  Patients are able to view lab/test results, encounter notes, upcoming appointments, etc.  Non-urgent messages can be sent to your provider as well.   To learn more about what you can do with MyChart, go to [email or web address].    Your next appointment:   6  months  Other Instructions:  Needs follow up appointment with CHF clinic
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
## 2022-09-03 ENCOUNTER — Telehealth: Payer: Self-pay

## 2022-09-03 DIAGNOSIS — Z79899 Other long term (current) drug therapy: Secondary | ICD-10-CM

## 2022-09-03 DIAGNOSIS — R7989 Other specified abnormal findings of blood chemistry: Secondary | ICD-10-CM

## 2022-09-03 NOTE — Telephone Encounter (Signed)
Spoke with pt and advised per Dr Graciela Husbands TSH is low and will need to check free T3 and free T4, recheck TSH in 4 weeks.  Pt verbalizes understanding and agrees with current plan.  Lab appointment scheduled for 09/04/2022.

## 2022-09-03 NOTE — Telephone Encounter (Signed)
-----   Message from Sherryl Manges sent at 08/30/2022  6:20 PM EDT ----- Please Inform Patient that TSH is low We will need to check free T3 and free T4 and repeat TSH in 4 weeks  Thanks

## 2022-09-04 ENCOUNTER — Ambulatory Visit: Payer: Medicaid Other | Attending: Internal Medicine

## 2022-09-04 DIAGNOSIS — Z79899 Other long term (current) drug therapy: Secondary | ICD-10-CM

## 2022-09-04 DIAGNOSIS — R7989 Other specified abnormal findings of blood chemistry: Secondary | ICD-10-CM

## 2022-09-10 ENCOUNTER — Inpatient Hospital Stay: Payer: Medicaid Other | Attending: Hematology & Oncology

## 2022-09-10 ENCOUNTER — Inpatient Hospital Stay: Payer: Medicaid Other

## 2022-09-10 ENCOUNTER — Other Ambulatory Visit: Payer: Self-pay

## 2022-09-10 ENCOUNTER — Other Ambulatory Visit: Payer: Self-pay | Admitting: Medical Oncology

## 2022-09-10 ENCOUNTER — Encounter: Payer: Self-pay | Admitting: Medical Oncology

## 2022-09-10 ENCOUNTER — Inpatient Hospital Stay (HOSPITAL_BASED_OUTPATIENT_CLINIC_OR_DEPARTMENT_OTHER): Payer: Medicaid Other | Admitting: Medical Oncology

## 2022-09-10 VITALS — BP 104/53 | HR 66 | Temp 98.1°F | Resp 20

## 2022-09-10 DIAGNOSIS — D751 Secondary polycythemia: Secondary | ICD-10-CM

## 2022-09-10 DIAGNOSIS — D500 Iron deficiency anemia secondary to blood loss (chronic): Secondary | ICD-10-CM

## 2022-09-10 DIAGNOSIS — Z1589 Genetic susceptibility to other disease: Secondary | ICD-10-CM

## 2022-09-10 LAB — CMP (CANCER CENTER ONLY)
ALT: 44 U/L (ref 0–44)
AST: 29 U/L (ref 15–41)
Albumin: 4.2 g/dL (ref 3.5–5.0)
Alkaline Phosphatase: 81 U/L (ref 38–126)
Anion gap: 9 (ref 5–15)
BUN: 15 mg/dL (ref 8–23)
CO2: 24 mmol/L (ref 22–32)
Calcium: 8.9 mg/dL (ref 8.9–10.3)
Chloride: 106 mmol/L (ref 98–111)
Creatinine: 1.25 mg/dL — ABNORMAL HIGH (ref 0.61–1.24)
GFR, Estimated: >60 mL/min (ref >60)
Glucose, Bld: 99 mg/dL (ref 70–99)
Potassium: 4.2 mmol/L (ref 3.5–5.1)
Sodium: 139 mmol/L (ref 135–145)
Total Bilirubin: 1.1 mg/dL (ref 0.3–1.2)
Total Protein: 7.2 g/dL (ref 6.5–8.1)

## 2022-09-10 LAB — CBC WITH DIFFERENTIAL (CANCER CENTER ONLY)
Abs Immature Granulocytes: 0.03 10*3/uL (ref 0.00–0.07)
Basophils Absolute: 0.1 10*3/uL (ref 0.0–0.1)
Basophils Relative: 1 %
Eosinophils Absolute: 0.3 10*3/uL (ref 0.0–0.5)
Eosinophils Relative: 3 %
HCT: 48 % (ref 39.0–52.0)
Hemoglobin: 13 g/dL (ref 13.0–17.0)
Immature Granulocytes: 0 %
Lymphocytes Relative: 40 %
Lymphs Abs: 4.1 10*3/uL — ABNORMAL HIGH (ref 0.7–4.0)
MCH: 18.5 pg — ABNORMAL LOW (ref 26.0–34.0)
MCHC: 27.1 g/dL — ABNORMAL LOW (ref 30.0–36.0)
MCV: 68.2 fL — ABNORMAL LOW (ref 80.0–100.0)
Monocytes Absolute: 0.7 10*3/uL (ref 0.1–1.0)
Monocytes Relative: 7 %
Neutro Abs: 5 10*3/uL (ref 1.7–7.7)
Neutrophils Relative %: 49 %
Platelet Count: 324 10*3/uL (ref 150–400)
RBC: 7.04 MIL/uL — ABNORMAL HIGH (ref 4.22–5.81)
RDW: 22 % — ABNORMAL HIGH (ref 11.5–15.5)
WBC Count: 10.2 10*3/uL (ref 4.0–10.5)
nRBC: 0 % (ref 0.0–0.2)

## 2022-09-10 LAB — IRON AND IRON BINDING CAPACITY (CC-WL,HP ONLY)
Iron: 26 ug/dL — ABNORMAL LOW (ref 45–182)
Saturation Ratios: 7 % — ABNORMAL LOW (ref 17.9–39.5)
TIBC: 358 ug/dL (ref 250–450)
UIBC: 332 ug/dL (ref 117–376)

## 2022-09-10 LAB — FERRITIN: Ferritin: 10 ng/mL — ABNORMAL LOW (ref 24–336)

## 2022-09-10 MED ORDER — SODIUM CHLORIDE 0.9 % IV SOLN: Freq: Once | INTRAVENOUS | Status: AC

## 2022-09-10 NOTE — Progress Notes (Signed)
John Conner presents today for phlebotomy per MD orders. Phlebotomy procedure started at 1120 via 20 gauge angio cath to right ac and ended at 1145. 550 grams removed.  500 ml NS given IV per order after phlebotomy.  Patient observed for 30 minutes after procedure without any incident. Patient tolerated procedure well. IV needle removed intact.

## 2022-09-10 NOTE — Patient Instructions (Signed)

## 2022-09-10 NOTE — Progress Notes (Signed)
Hematology and Oncology Follow Up Visit  John Conner [id number] 1960/08/24 62 y.o. 09/10/2022   Principle Diagnosis:  JAK 2 positive    Current Therapy:        Phlebotomy to maintain Hct < 45%   Interim History:  John Conner is here today for follow-up. He reports that overall he is doing pretty well. Has had some headache so he thinks he is in need of a phlebotomy. Hi last phlebotomy was on 07/30/2022. Prior phlebotomies on:  06/18/2022, 05/01/2022, 03/19/2021, 02/18/2022, 02/12/2022, 01/01/2022, 12/24/2021, 12/18/2021, 12/04/2022, 11/26/2021, 11/19/2021, 11/12/2021.   Taking his Eliquis as directed No fever, chills, n/v, cough, rash, dizziness, SOB, chest pain, palpitations, abdominal pain or changes in bowel or bladder habits.  No swelling, tenderness, numbness or tingling in his extremities at this time.  No falls or syncope.  He ambulates with a Rolator for added support.  Appetite and hydration are good. Losing weight on his Ozempic  Wt Readings from Last 3 Encounters:  08/29/22 (!) 319 lb 9.6 oz (145 kg)  07/27/22 (!) 320 lb (145.2 kg)  06/18/22 (!) 324 lb 1.3 oz (147 kg)     ECOG Performance Status: 1 - Symptomatic but completely ambulatory  Medications:  Allergies as of 09/10/2022       Reactions   Diphenhydramine Hcl Swelling   Lips swell   Lisinopril Cough        Medication List        Accurate as of September 10, 2022  1:08 PM. If you have any questions, ask your nurse or doctor.          amiodarone 200 MG tablet Commonly known as: PACERONE Take 1 tablet by mouth daily 5 days per week.  Monday- Friday   atorvastatin 80 MG tablet Commonly known as: LIPITOR TAKE 1 TABLET BY MOUTH EVERY DAY   carvedilol 12.5 MG tablet Commonly known as: COREG Take 1 tablet (12.5 mg total) by mouth 2 (two) times daily with a meal.   diclofenac Sodium 1 % Gel Commonly known as: VOLTAREN Apply 2 g topically 4 (four) times daily.   Eliquis 5 MG Tabs tablet Generic  drug: apixaban TAKE 1 TABLET BY MOUTH TWICE A DAY   Entresto 49-51 MG Generic drug: sacubitril-valsartan Take 1 tablet by mouth 2 (two) times daily.   fluticasone 50 MCG/ACT nasal spray Commonly known as: FLONASE Place 1 spray into both nostrils as needed for allergies or rhinitis.   furosemide 40 MG tablet Commonly known as: LASIX Take 1 tablet (40 mg total) by mouth daily.   Jardiance 10 MG Tabs tablet Generic drug: empagliflozin Take 1 tablet (10 mg total) by mouth daily. *Pt need office visit*   Ozempic (1 MG/DOSE) 4 MG/3ML Sopn Generic drug: Semaglutide (1 MG/DOSE) Inject into the skin. What changed: Another medication with the same name was removed. Continue taking this medication, and follow the directions you see here. Changed by: Rushie Chestnut   potassium chloride SA 20 MEQ tablet Commonly known as: KLOR-CON M Take 1 tablet (20 mEq total) by mouth daily.   spironolactone 25 MG tablet Commonly known as: ALDACTONE Take 0.5 tablets (12.5 mg total) by mouth daily. NEEDS FOLLOW UP APPOINTMENT FOR MORE REFILLS   tadalafil 5 MG tablet Commonly known as: CIALIS Take 5 mg by mouth as directed.   Vitamin D3 10 MCG (400 UNIT) tablet Take 400 Units by mouth daily.        Allergies:  Allergies  Allergen Reactions   Diphenhydramine  Hcl Swelling    Lips swell     Lisinopril Cough    Past Medical History, Surgical history, Social history, and Family History were reviewed and updated.  Review of Systems: All other 10 point review of systems is negative.   Physical Exam:  temperature is 98.1 F (36.7 C). His blood pressure is 104/53 (abnormal) and his pulse is 66. His respiration is 20 and oxygen saturation is 98%.   Wt Readings from Last 3 Encounters:  08/29/22 (!) 319 lb 9.6 oz (145 kg)  07/27/22 (!) 320 lb (145.2 kg)  06/18/22 (!) 324 lb 1.3 oz (147 kg)   General: Using a Rolator  Ocular: Sclerae unicteric, pupils equal, round and reactive to  light Ear-nose-throat: Oropharynx clear, dentition fair Lymphatic: No cervical or supraclavicular adenopathy Lungs no rales or rhonchi, good excursion bilaterally Heart regular rate and rhythm, no murmur appreciated Abd soft, nontender, positive bowel sounds MSK no focal spinal tenderness, no joint edema Neuro: non-focal, well-oriented, appropriate affect   Lab Results  Component Value Date   WBC 10.2 09/10/2022   HGB 13.0 09/10/2022   HCT 48.0 09/10/2022   MCV 68.2 (L) 09/10/2022   PLT 324 09/10/2022   Lab Results  Component Value Date   FERRITIN 12 (L) 06/18/2022   IRON 22 (L) 06/18/2022   TIBC 375 06/18/2022   UIBC 353 06/18/2022   IRONPCTSAT 6 (L) 06/18/2022   Lab Results  Component Value Date   RETICCTPCT 1.4 11/12/2021   RBC 7.04 (H) 09/10/2022   No results found for: "KPAFRELGTCHN", "LAMBDASER", "KAPLAMBRATIO" No results found for: "IGGSERUM", "IGA", "IGMSERUM" No results found for: "TOTALPROTELP", "ALBUMINELP", "A1GS", "A2GS", "BETS", "BETA2SER", "GAMS", "MSPIKE", "SPEI"   Chemistry      Component Value Date/Time   NA 139 09/10/2022 1034   NA 141 03/15/2022 0857   K 4.2 09/10/2022 1034   CL 106 09/10/2022 1034   CO2 24 09/10/2022 1034   BUN 15 09/10/2022 1034   BUN 13 03/15/2022 0857   CREATININE 1.25 (H) 09/10/2022 1034   CREATININE 1.11 09/09/2014 1017      Component Value Date/Time   CALCIUM 8.9 09/10/2022 1034   ALKPHOS 81 09/10/2022 1034   AST 29 09/10/2022 1034   ALT 44 09/10/2022 1034   BILITOT 1.1 09/10/2022 1034      Encounter Diagnoses  Name Primary?   Erythrocytosis Yes   JAK-2 gene mutation      Impression and Plan: John Conner is a very pleasant 62 yo African American gentleman with positive JAK 2.  Phlebotomy today for Hct 48%.  Will discuss with his Hematologist regarding his parameters and if Hydrea/Jakifi would be reasonable to reduce the number of phlebotomies he requires. Lab check and phlebotomy every 6 weeks.     Disposition Phlebotomy today with fluid replacement RTC every 6 weeks labs(CBC, CMP, ferritin, iron)/Phlebotomy with fluid replacement  RTC 3 months APP, labs,(CBC, CMP, ferritin, iron) Phlebotomy with fluid replacement    Rushie Chestnut, PA-C 8/13/20241:09 PM

## 2022-09-11 ENCOUNTER — Other Ambulatory Visit: Payer: Self-pay | Admitting: Internal Medicine

## 2022-09-16 ENCOUNTER — Telehealth: Payer: Self-pay

## 2022-09-16 ENCOUNTER — Other Ambulatory Visit: Payer: Self-pay

## 2022-09-16 DIAGNOSIS — Z79899 Other long term (current) drug therapy: Secondary | ICD-10-CM

## 2022-09-16 DIAGNOSIS — I4819 Other persistent atrial fibrillation: Secondary | ICD-10-CM

## 2022-09-16 DIAGNOSIS — R7989 Other specified abnormal findings of blood chemistry: Secondary | ICD-10-CM

## 2022-09-16 NOTE — Telephone Encounter (Signed)
-----   Message from Sherryl Manges sent at 09/11/2022 12:25 PM EDT ----- Please Inform Patient that -TSH is suppressed and T4 increased   is abnormal and will require further eval Can we order  1) thyroid color doppler to look for enhanced flow to the thyroid 2) thryoid receptor antibodies  Thanks

## 2022-09-16 NOTE — Progress Notes (Signed)
Per Dr Graciela Husbands, orders placed as below.   Please Inform Patient that -TSH is suppressed and T4 increased   is abnormal and will require further eval Can we order 1) thyroid color doppler to look for enhanced flow to the thyroid 2) thryoid receptor antibodies Thanks Duke Salvia, MD

## 2022-09-16 NOTE — Telephone Encounter (Signed)
Spoke with pt and advised of lab results and recommendation per Dr Graciela Husbands as below.  Pt verbalizes understanding and agrees with current plan. Orders have been placed and message sent to precert and pcc pool.

## 2022-09-17 ENCOUNTER — Inpatient Hospital Stay: Payer: Medicaid Other

## 2022-09-17 ENCOUNTER — Inpatient Hospital Stay: Payer: Medicaid Other | Admitting: Medical Oncology

## 2022-09-18 ENCOUNTER — Ambulatory Visit (HOSPITAL_COMMUNITY): Payer: Medicaid Other | Attending: Internal Medicine

## 2022-09-18 DIAGNOSIS — I428 Other cardiomyopathies: Secondary | ICD-10-CM

## 2022-09-18 DIAGNOSIS — I493 Ventricular premature depolarization: Secondary | ICD-10-CM | POA: Diagnosis not present

## 2022-09-18 DIAGNOSIS — I4819 Other persistent atrial fibrillation: Secondary | ICD-10-CM | POA: Diagnosis not present

## 2022-09-18 LAB — ECHOCARDIOGRAM COMPLETE
Area-P 1/2: 2.69 cm2
S' Lateral: 4.5 cm
Single Plane A4C EF: 57.8 %

## 2022-09-19 LAB — THYROID ANTIBODIES: Thyroperoxidase Ab SerPl-aCnc: 10 IU/mL (ref 0–34)

## 2022-09-19 LAB — THYROID ANTIBODIES (THYROPEROXIDASE & THYROGLOBULIN): Thyroglobulin Antibody: <1 [IU]/mL (ref 0.0–0.9)

## 2022-10-05 ENCOUNTER — Other Ambulatory Visit (HOSPITAL_COMMUNITY): Payer: Self-pay | Admitting: Internal Medicine

## 2022-10-07 ENCOUNTER — Other Ambulatory Visit (HOSPITAL_BASED_OUTPATIENT_CLINIC_OR_DEPARTMENT_OTHER): Payer: Self-pay

## 2022-10-07 MED ORDER — EMPAGLIFLOZIN 10 MG PO TABS
10.0000 mg | ORAL_TABLET | Freq: Every day | ORAL | 0 refills | Status: DC
  Filled 2022-10-07 – 2022-10-22 (×2): qty 60, 60d supply, fill #0

## 2022-10-10 ENCOUNTER — Telehealth: Payer: Self-pay

## 2022-10-10 DIAGNOSIS — E0590 Thyrotoxicosis, unspecified without thyrotoxic crisis or storm: Secondary | ICD-10-CM

## 2022-10-10 NOTE — Telephone Encounter (Signed)
-----   Message from Sherryl Manges sent at 10/04/2022  5:35 PM EDT ----- Please Inform Patient that labs are normal  bu not  helpful Can we order thyrotropin receptor antibody   Also lets start John Conner on methimazole 30 mg daily and prednisone 40 mg daily with repeat TSH free T3 and free T4 in 4 weeks and either office visit or telehealth Thanks

## 2022-10-10 NOTE — Telephone Encounter (Signed)
Spoke with pt and advised of Lab results and recommendations per Dr Graciela Husbands.  Pt verbalizes understanding and agrees with current plan.

## 2022-10-11 ENCOUNTER — Encounter: Payer: Self-pay | Admitting: Family

## 2022-10-11 MED ORDER — PREDNISONE 20 MG PO TABS: ORAL_TABLET | ORAL | 0 refills | Status: DC

## 2022-10-11 MED ORDER — METHIMAZOLE 10 MG PO TABS: 30.0000 mg | ORAL_TABLET | Freq: Every day | ORAL | 0 refills | Status: DC

## 2022-10-11 NOTE — Telephone Encounter (Signed)
Spoke with pt and advised labs requested by Dr Graciela Husbands will need to be drawn by [facility].  Printed orders placed with front desk.  Pt verbalizes understanding and agrees with current plan.

## 2022-10-15 ENCOUNTER — Ambulatory Visit: Payer: Medicaid Other

## 2022-10-17 ENCOUNTER — Other Ambulatory Visit (HOSPITAL_BASED_OUTPATIENT_CLINIC_OR_DEPARTMENT_OTHER): Payer: Self-pay

## 2022-10-19 ENCOUNTER — Other Ambulatory Visit (HOSPITAL_COMMUNITY): Payer: Self-pay | Admitting: Internal Medicine

## 2022-10-19 ENCOUNTER — Other Ambulatory Visit: Payer: Self-pay | Admitting: Internal Medicine

## 2022-10-22 ENCOUNTER — Inpatient Hospital Stay: Payer: Medicaid Other

## 2022-10-22 ENCOUNTER — Other Ambulatory Visit (HOSPITAL_BASED_OUTPATIENT_CLINIC_OR_DEPARTMENT_OTHER): Payer: Self-pay

## 2022-10-24 ENCOUNTER — Other Ambulatory Visit: Payer: Self-pay | Admitting: *Deleted

## 2022-10-24 DIAGNOSIS — Z1589 Genetic susceptibility to other disease: Secondary | ICD-10-CM

## 2022-10-24 DIAGNOSIS — D500 Iron deficiency anemia secondary to blood loss (chronic): Secondary | ICD-10-CM

## 2022-10-25 ENCOUNTER — Inpatient Hospital Stay: Payer: Medicaid Other

## 2022-10-25 ENCOUNTER — Inpatient Hospital Stay: Payer: Medicaid Other | Attending: Medical Oncology

## 2022-10-25 DIAGNOSIS — Z1589 Genetic susceptibility to other disease: Secondary | ICD-10-CM

## 2022-10-25 DIAGNOSIS — D751 Secondary polycythemia: Secondary | ICD-10-CM | POA: Diagnosis present

## 2022-10-25 DIAGNOSIS — D500 Iron deficiency anemia secondary to blood loss (chronic): Secondary | ICD-10-CM

## 2022-10-25 LAB — CMP (CANCER CENTER ONLY)
ALT: 45 U/L — ABNORMAL HIGH (ref 0–44)
AST: 25 U/L (ref 15–41)
Albumin: 3.9 g/dL (ref 3.5–5.0)
Alkaline Phosphatase: 67 U/L (ref 38–126)
Anion gap: 11 (ref 5–15)
BUN: 20 mg/dL (ref 8–23)
CO2: 22 mmol/L (ref 22–32)
Calcium: 8.7 mg/dL — ABNORMAL LOW (ref 8.9–10.3)
Chloride: 106 mmol/L (ref 98–111)
Creatinine: 1.35 mg/dL — ABNORMAL HIGH (ref 0.61–1.24)
GFR, Estimated: 59 mL/min — ABNORMAL LOW (ref >60)
Glucose, Bld: 87 mg/dL (ref 70–99)
Potassium: 3.8 mmol/L (ref 3.5–5.1)
Sodium: 139 mmol/L (ref 135–145)
Total Bilirubin: 0.7 mg/dL (ref 0.3–1.2)
Total Protein: 7 g/dL (ref 6.5–8.1)

## 2022-10-25 LAB — CBC WITH DIFFERENTIAL (CANCER CENTER ONLY)
Abs Immature Granulocytes: 0.05 10*3/uL (ref 0.00–0.07)
Basophils Absolute: 0.1 10*3/uL (ref 0.0–0.1)
Basophils Relative: 1 %
Eosinophils Absolute: 0.5 10*3/uL (ref 0.0–0.5)
Eosinophils Relative: 3 %
HCT: 44 % (ref 39.0–52.0)
Hemoglobin: 12.2 g/dL — ABNORMAL LOW (ref 13.0–17.0)
Immature Granulocytes: 0 %
Lymphocytes Relative: 41 %
Lymphs Abs: 6.5 10*3/uL — ABNORMAL HIGH (ref 0.7–4.0)
MCH: 18.2 pg — ABNORMAL LOW (ref 26.0–34.0)
MCHC: 27.7 g/dL — ABNORMAL LOW (ref 30.0–36.0)
MCV: 65.5 fL — ABNORMAL LOW (ref 80.0–100.0)
Monocytes Absolute: 1 10*3/uL (ref 0.1–1.0)
Monocytes Relative: 6 %
Neutro Abs: 7.8 10*3/uL — ABNORMAL HIGH (ref 1.7–7.7)
Neutrophils Relative %: 49 %
Platelet Count: 295 10*3/uL (ref 150–400)
RBC: 6.72 MIL/uL — ABNORMAL HIGH (ref 4.22–5.81)
RDW: 22 % — ABNORMAL HIGH (ref 11.5–15.5)
Smear Review: NORMAL
WBC Count: 15.9 10*3/uL — ABNORMAL HIGH (ref 4.0–10.5)
nRBC: 0.2 % (ref 0.0–0.2)

## 2022-10-25 NOTE — Progress Notes (Signed)
Patient does not need a phlebotomy today, HCT.44.0 .

## 2022-11-01 ENCOUNTER — Telehealth: Payer: Self-pay | Admitting: Internal Medicine

## 2022-11-01 NOTE — Telephone Encounter (Signed)
Spoke with pt and has been on new meds for 2 weeks Per pt starting this week has had diarrhea since Monday or Tuesday and went from brown to black Encouraged pt to contact PCP Will forward to Dr Graciela Husbands  for review and recommendations .Linwood Dibbles

## 2022-11-01 NOTE — Telephone Encounter (Signed)
Pt c/o medication issue:  1. Name of Medication:   methimazole (TAPAZOLE) 10 MG tablet  predniSONE (DELTASONE) 20 MG tablet   2. How are you currently taking this medication (dosage and times per day)?   As prescribed  3. Are you having a reaction (difficulty breathing--STAT)?   4. What is your medication issue?   Patient states he is having problems with his bowel movements and thinks it may be related to these new medication.  Patient wants advice on next steps.

## 2022-11-04 ENCOUNTER — Encounter (HOSPITAL_BASED_OUTPATIENT_CLINIC_OR_DEPARTMENT_OTHER): Payer: Self-pay

## 2022-11-04 ENCOUNTER — Emergency Department (HOSPITAL_BASED_OUTPATIENT_CLINIC_OR_DEPARTMENT_OTHER)
Admission: EM | Admit: 2022-11-04 | Discharge: 2022-11-04 | Disposition: A | Discharge status: Home / Self Care | Payer: Medicaid Other | Attending: Emergency Medicine | Admitting: Emergency Medicine

## 2022-11-04 ENCOUNTER — Encounter: Payer: Self-pay | Admitting: Family

## 2022-11-04 DIAGNOSIS — R197 Diarrhea, unspecified: Secondary | ICD-10-CM | POA: Diagnosis present

## 2022-11-04 DIAGNOSIS — I110 Hypertensive heart disease with heart failure: Secondary | ICD-10-CM | POA: Insufficient documentation

## 2022-11-04 DIAGNOSIS — I509 Heart failure, unspecified: Secondary | ICD-10-CM | POA: Insufficient documentation

## 2022-11-04 DIAGNOSIS — Z79899 Other long term (current) drug therapy: Secondary | ICD-10-CM | POA: Insufficient documentation

## 2022-11-04 DIAGNOSIS — Z7901 Long term (current) use of anticoagulants: Secondary | ICD-10-CM | POA: Insufficient documentation

## 2022-11-04 LAB — CBC WITH DIFFERENTIAL/PLATELET
Abs Immature Granulocytes: 0.05 10*3/uL (ref 0.00–0.07)
Basophils Absolute: 0.1 10*3/uL (ref 0.0–0.1)
Basophils Relative: 1 %
Eosinophils Absolute: 0.6 10*3/uL — ABNORMAL HIGH (ref 0.0–0.5)
Eosinophils Relative: 4 %
HCT: 43.8 % (ref 39.0–52.0)
Hemoglobin: 11.8 g/dL — ABNORMAL LOW (ref 13.0–17.0)
Immature Granulocytes: 0 %
Lymphocytes Relative: 45 %
Lymphs Abs: 7.7 10*3/uL — ABNORMAL HIGH (ref 0.7–4.0)
MCH: 17.8 pg — ABNORMAL LOW (ref 26.0–34.0)
MCHC: 26.9 g/dL — ABNORMAL LOW (ref 30.0–36.0)
MCV: 66.1 fL — ABNORMAL LOW (ref 80.0–100.0)
Monocytes Absolute: 0.8 10*3/uL (ref 0.1–1.0)
Monocytes Relative: 5 %
Neutro Abs: 7.7 10*3/uL (ref 1.7–7.7)
Neutrophils Relative %: 45 %
Platelets: 284 10*3/uL (ref 150–400)
RBC: 6.63 MIL/uL — ABNORMAL HIGH (ref 4.22–5.81)
RDW: 22.5 % — ABNORMAL HIGH (ref 11.5–15.5)
WBC: 16.9 10*3/uL — ABNORMAL HIGH (ref 4.0–10.5)
nRBC: 0.1 % (ref 0.0–0.2)

## 2022-11-04 LAB — COMPREHENSIVE METABOLIC PANEL
ALT: 43 U/L (ref 0–44)
AST: 25 U/L (ref 15–41)
Albumin: 3.5 g/dL (ref 3.5–5.0)
Alkaline Phosphatase: 63 U/L (ref 38–126)
Anion gap: 10 (ref 5–15)
BUN: 16 mg/dL (ref 8–23)
CO2: 20 mmol/L — ABNORMAL LOW (ref 22–32)
Calcium: 8.1 mg/dL — ABNORMAL LOW (ref 8.9–10.3)
Chloride: 108 mmol/L (ref 98–111)
Creatinine, Ser: 1.27 mg/dL — ABNORMAL HIGH (ref 0.61–1.24)
GFR, Estimated: >60 mL/min (ref >60)
Glucose, Bld: 102 mg/dL — ABNORMAL HIGH (ref 70–99)
Potassium: 3.4 mmol/L — ABNORMAL LOW (ref 3.5–5.1)
Sodium: 138 mmol/L (ref 135–145)
Total Bilirubin: 0.7 mg/dL (ref 0.3–1.2)
Total Protein: 6.4 g/dL — ABNORMAL LOW (ref 6.5–8.1)

## 2022-11-04 NOTE — ED Provider Notes (Signed)
[place] [facility] Provider Note   CSN: [id number] Arrival date & time: 11/04/22  0736     History  Chief Complaint  Patient presents with   Diarrhea    John Conner is a 62 y.o. male.  Patient here with diarrhea for the past week.  Denies any recent antibiotic use.  Denies any abdominal pain denies any fever.  Denies any nausea or vomiting.  Has been able to eat and drink well.  He has a history of hypertension high cholesterol CHF.  He is on blood thinner.  He denies any black or bloody stools.  He has been able to follow-up with his primary care doctor.  He denies any new medications otherwise.  Denies any suspicious food intake.  No suspicious travel to either.  He is maybe having several episodes of diarrhea a day.  Has taken some Pepto-Bismol.  But he has not taken any Imodium.  The history is provided by the patient.       Home Medications Prior to Admission medications   Medication Sig Start Date End Date Taking? Authorizing Provider  amiodarone (PACERONE) 200 MG tablet TAKE 1 TABLET (200 MG TOTAL) BY MOUTH DAILY. 10/22/22   Duke Salvia, MD  atorvastatin (LIPITOR) 80 MG tablet TAKE 1 TABLET BY MOUTH EVERY DAY 03/19/22   Duke Salvia, MD  carvedilol (COREG) 12.5 MG tablet Take 1 tablet (12.5 mg total) by mouth 2 (two) times daily with a meal. 10/23/21   Allayne Butcher, PA-C  Cholecalciferol (VITAMIN D3) 10 MCG (400 UNIT) tablet Take 400 Units by mouth daily.    [provider]  diclofenac Sodium (VOLTAREN) 1 % GEL Apply 2 g topically 4 (four) times daily. 11/06/21   Rana Snare, DO  ELIQUIS 5 MG TABS tablet TAKE 1 TABLET BY MOUTH TWICE A DAY 03/28/22   Bensimhon, Bevelyn Buckles, MD  empagliflozin (JARDIANCE) 10 MG TABS tablet Take 1 tablet (10 mg total) by mouth daily. *Pt need office visit* 10/07/22   Bensimhon, Bevelyn Buckles, MD  fluticasone [facility]) 50 MCG/ACT nasal spray Place 1 spray into both nostrils as needed for  allergies or rhinitis.    [provider]  furosemide (LASIX) 40 MG tablet Take 1 tablet (40 mg total) by mouth daily. 04/17/22   Duke Salvia, MD  methimazole (TAPAZOLE) 10 MG tablet Take 3 tablets (30 mg total) by mouth daily. 10/11/22   Duke Salvia, MD  OZEMPIC, 1 MG/DOSE, 4 MG/3ML SOPN Inject into the skin.    [provider]  potassium chloride SA (KLOR-CON M) 20 MEQ tablet Take 1 tablet (20 mEq total) by mouth daily. 02/08/21   Zannie Cove, MD  predniSONE (DELTASONE) 20 MG tablet Take 2 tablets by mouth (40mg ) by mouth daily x 30 days then 1 tablet (20mg ) by mouth daily x 30 days then 1/2 tablet (10mg ) x 30 days 10/11/22   Duke Salvia, MD  sacubitril-valsartan (ENTRESTO) 49-51 MG Take 1 tablet by mouth 2 (two) times daily. 08/29/22   Duke Salvia, MD  spironolactone (ALDACTONE) 25 MG tablet TAKE 0.5 TABLETS (12.5 MG TOTAL) BY MOUTH DAILY. NEEDS FOLLOW UP APPOINTMENT FOR MORE REFILLS 10/22/22   Bensimhon, Bevelyn Buckles, MD  tadalafil (CIALIS) 5 MG tablet Take 5 mg by mouth as directed. 02/12/22   [provider]      Allergies    Diphenhydramine hcl and Lisinopril    Review of Systems   Review of Systems  Physical Exam Updated Vital Signs BP 133/86 (BP Location: Right Arm)   Pulse 71   Temp 97.9 F (36.6 C) (Oral)   Resp 18   Ht 5\' 10"  (1.778 m)   Wt (!) 149.4 kg   SpO2 96%   BMI 47.26 kg/m  Physical Exam Vitals and nursing note reviewed.  Constitutional:      General: He is not in acute distress.    Appearance: He is well-developed.  HENT:     Head: Normocephalic and atraumatic.     Mouth/Throat:     Mouth: Mucous membranes are moist.  Eyes:     Extraocular Movements: Extraocular movements intact.     Conjunctiva/sclera: Conjunctivae normal.     Pupils: Pupils are equal, round, and reactive to light.  Cardiovascular:     Rate and Rhythm: Normal rate and regular rhythm.     Pulses: Normal pulses.     Heart sounds: Normal heart sounds.  No murmur heard. Pulmonary:     Effort: Pulmonary effort is normal. No respiratory distress.     Breath sounds: Normal breath sounds.  Abdominal:     General: Abdomen is flat.     Palpations: Abdomen is soft.     Tenderness: There is no abdominal tenderness.  Musculoskeletal:        General: No swelling.     Cervical back: Neck supple.  Skin:    General: Skin is warm and dry.     Capillary Refill: Capillary refill takes less than 2 seconds.  Neurological:     Mental Status: He is alert.  Psychiatric:        Mood and Affect: Mood normal.     ED Results / Procedures / Treatments   Labs (all labs ordered are listed, but only abnormal results are displayed) Labs Reviewed  CBC WITH DIFFERENTIAL/PLATELET - Abnormal; Notable for the following components:      Result Value   WBC 16.9 (*)    RBC 6.63 (*)    Hemoglobin 11.8 (*)    MCV 66.1 (*)    MCH 17.8 (*)    MCHC 26.9 (*)    RDW 22.5 (*)    Lymphs Abs 7.7 (*)    Eosinophils Absolute 0.6 (*)    All other components within normal limits  COMPREHENSIVE METABOLIC PANEL - Abnormal; Notable for the following components:   Potassium 3.4 (*)    CO2 20 (*)    Glucose, Bld 102 (*)    Creatinine, Ser 1.27 (*)    Calcium 8.1 (*)    Total Protein 6.4 (*)    All other components within normal limits    EKG None  Radiology No results found.  Procedures Procedures    Medications Ordered in ED Medications - No data to display  ED Course/ Medical Decision Making/ A&P                                 Medical Decision Making Amount and/or Complexity of Data Reviewed Labs: ordered.   John Conner is here with diarrhea.  Normal vitals.  No fever.  Well-appearing.  Patient drinking a soda.  He is not had any nausea or vomiting.  He is been able to eat eat and drink well.  He has no abdominal pain.  He was concerned about the loose stools for the last week.  Not getting better despite Pepto-Bismol pill he has no abdominal  tenderness  on exam.  Clinically he does not look dehydrated.  He does have a history of heart failure hypertension high cholesterol.  Will just check basic labs to make sure he does not have any electrolyte abnormalities or kidney injury.  Differential diagnosis likely viral process or foodborne illness.  May be a medication side effect.  Could be C. difficile or other GI stool pathogen.  Will try to collect a sample if he is able to give [place] 1 here.  Otherwise he can give 1 to his primary care doctor.  Overall lab work shows mild leukocytosis of 16.9 but otherwise unremarkable labs.  No significant anemia or electrolyte abnormality.  Creatinine at baseline.  He is not having any abdominal pain or fever.  I suspect that he has some sort of viral or foodborne illness.  Seems less likely to be C. difficile given no recent antibiotics.  He is able to eat and drink without any issues.  He is very well-appearing.  Clinically he does not look dehydrated.  Will have him follow-up with his primary care doctor to provide stool sample.  Recommend Imodium as needed.  Discharged in good condition.  This chart was dictated using voice recognition software.  Despite best efforts to proofread,  errors can occur which can change the documentation meaning.         Final Clinical Impression(s) / ED Diagnoses Final diagnoses:  Diarrhea, unspecified type    Rx / DC Orders ED Discharge Orders     None         Virgina Norfolk, DO 11/04/22 [phone]

## 2022-11-04 NOTE — Discharge Instructions (Signed)
Consider taking Imodium over-the-counter to help with your diarrhea.  Follow-up with your primary care doctor to provide stool sample.  Return if symptoms worsen including fever, abdominal pain.

## 2022-11-04 NOTE — Telephone Encounter (Signed)
Pt was evaluated in the ED this morning.  See Epic note for complete details.

## 2022-11-04 NOTE — ED Triage Notes (Signed)
Pt reports that he has been having diarrhea x 1 week. Denies ABT use. Denies abdominal pain.

## 2022-11-05 ENCOUNTER — Ambulatory Visit (HOSPITAL_COMMUNITY)
Admission: RE | Admit: 2022-11-05 | Discharge: 2022-11-05 | Disposition: A | Discharge status: Home / Self Care | Payer: Medicaid Other | Source: Ambulatory Visit | Attending: Internal Medicine | Admitting: Internal Medicine

## 2022-11-05 DIAGNOSIS — Z79899 Other long term (current) drug therapy: Secondary | ICD-10-CM | POA: Insufficient documentation

## 2022-11-05 DIAGNOSIS — R7989 Other specified abnormal findings of blood chemistry: Secondary | ICD-10-CM | POA: Insufficient documentation

## 2022-11-05 DIAGNOSIS — I4819 Other persistent atrial fibrillation: Secondary | ICD-10-CM | POA: Diagnosis present

## 2022-11-07 ENCOUNTER — Other Ambulatory Visit: Payer: Self-pay | Admitting: Internal Medicine

## 2022-11-07 NOTE — Telephone Encounter (Signed)
Pt's pharmacy is requesting a refill on prednisone. Would Dr. Graciela Husbands like to refill this medication? Please address

## 2022-11-09 ENCOUNTER — Other Ambulatory Visit (HOSPITAL_COMMUNITY): Payer: Self-pay | Admitting: Adult Health

## 2022-11-15 ENCOUNTER — Other Ambulatory Visit: Payer: Self-pay | Admitting: Internal Medicine

## 2022-11-26 ENCOUNTER — Telehealth: Payer: Self-pay | Admitting: Internal Medicine

## 2022-11-26 NOTE — Telephone Encounter (Signed)
Spoke with pt and advised Dr Graciela Husbands aware of pt's thyroid level and that is why he was asked to take Methimazole and Prednisone, complete additional lab work and have thyroid US.  Pt has appointment scheduled with Dr Graciela Husbands 11/28/2022.  Pt advised Dr Graciela Husbands will discuss with pt further at that visit.  Pt verbalizes understanding and agrees with current plan.

## 2022-11-26 NOTE — Telephone Encounter (Signed)
Pt states his PCP told him something is effecting his thyroid and he was told to talk to Dr. Graciela Husbands about it. Please advise.

## 2022-11-28 ENCOUNTER — Ambulatory Visit: Payer: Medicaid Other | Attending: Internal Medicine | Admitting: Internal Medicine

## 2022-11-28 ENCOUNTER — Encounter: Payer: Self-pay | Admitting: Internal Medicine

## 2022-11-28 VITALS — BP 116/80 | HR 74 | Ht 70.0 in | Wt 318.8 lb

## 2022-11-28 DIAGNOSIS — I493 Ventricular premature depolarization: Secondary | ICD-10-CM | POA: Diagnosis not present

## 2022-11-28 DIAGNOSIS — I4819 Other persistent atrial fibrillation: Secondary | ICD-10-CM | POA: Diagnosis not present

## 2022-11-28 DIAGNOSIS — Z79899 Other long term (current) drug therapy: Secondary | ICD-10-CM

## 2022-11-28 DIAGNOSIS — I5022 Chronic systolic (congestive) heart failure: Secondary | ICD-10-CM

## 2022-11-28 DIAGNOSIS — I428 Other cardiomyopathies: Secondary | ICD-10-CM

## 2022-11-28 MED ORDER — APIXABAN 5 MG PO TABS: 5.0000 mg | ORAL_TABLET | Freq: Two times a day (BID) | ORAL | 1 refills | Status: DC

## 2022-11-28 MED ORDER — EMPAGLIFLOZIN 10 MG PO TABS: 10.0000 mg | ORAL_TABLET | Freq: Every day | ORAL | 1 refills | Status: AC

## 2022-11-28 NOTE — Patient Instructions (Addendum)
Medication Instructions:  Your physician has recommended you make the following change in your medication:   ** Increase Furosemide 40mg  to 80mg  (2 tablets) by mouth x 3 days then return to your regular dose of Furosemide 40mg  - 1 tablet by mouth daily.  *If you need a refill on your cardiac medications before your next appointment, please call your pharmacy*   Lab Work:  BMET in 2 weeks  If you have labs (blood work) drawn today and your tests are completely normal, you will receive your results only by: MyChart Message (if you have MyChart) OR A paper copy in the mail If you have any lab test that is abnormal or we need to change your treatment, we will call you to review the results.   Testing/Procedures: None ordered.    Follow-Up: At [facility], you and your health needs are our priority.  As part of our continuing mission to provide you with exceptional heart care, we have created designated Provider Care Teams.  These Care Teams include your primary Cardiologist (physician) and Advanced Practice Providers (APPs -  Physician Assistants and Nurse Practitioners) who all work together to provide you with the care you need, when you need it.  We recommend signing up for the patient portal called "MyChart".  Sign up information is provided on this After Visit Summary.  MyChart is used to connect with patients for Virtual Visits (Telemedicine).  Patients are able to view lab/test results, encounter notes, upcoming appointments, etc.  Non-urgent messages can be sent to your provider as well.   To learn more about what you can do with MyChart, go to [email or web address].    Your next appointment:    2 months with Dr Odessa Fleming PA  8 months with Dr Graciela Husbands
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
## 2022-12-02 ENCOUNTER — Other Ambulatory Visit (HOSPITAL_BASED_OUTPATIENT_CLINIC_OR_DEPARTMENT_OTHER): Payer: Self-pay

## 2022-12-02 ENCOUNTER — Encounter: Payer: Self-pay | Admitting: Family

## 2022-12-03 ENCOUNTER — Inpatient Hospital Stay: Payer: Medicaid Other

## 2022-12-03 ENCOUNTER — Emergency Department (HOSPITAL_BASED_OUTPATIENT_CLINIC_OR_DEPARTMENT_OTHER): Payer: Medicaid Other

## 2022-12-03 ENCOUNTER — Inpatient Hospital Stay: Payer: Medicaid Other | Admitting: Medical Oncology

## 2022-12-03 ENCOUNTER — Other Ambulatory Visit: Payer: Self-pay

## 2022-12-03 ENCOUNTER — Emergency Department (HOSPITAL_BASED_OUTPATIENT_CLINIC_OR_DEPARTMENT_OTHER)
Admission: EM | Admit: 2022-12-03 | Discharge: 2022-12-03 | Disposition: A | Discharge status: Home / Self Care | Payer: Medicaid Other | Attending: Emergency Medicine | Admitting: Emergency Medicine

## 2022-12-03 ENCOUNTER — Encounter (HOSPITAL_BASED_OUTPATIENT_CLINIC_OR_DEPARTMENT_OTHER): Payer: Self-pay

## 2022-12-03 DIAGNOSIS — Z79899 Other long term (current) drug therapy: Secondary | ICD-10-CM | POA: Diagnosis not present

## 2022-12-03 DIAGNOSIS — I509 Heart failure, unspecified: Secondary | ICD-10-CM | POA: Insufficient documentation

## 2022-12-03 DIAGNOSIS — I110 Hypertensive heart disease with heart failure: Secondary | ICD-10-CM | POA: Insufficient documentation

## 2022-12-03 DIAGNOSIS — Z7901 Long term (current) use of anticoagulants: Secondary | ICD-10-CM | POA: Diagnosis not present

## 2022-12-03 DIAGNOSIS — M25511 Pain in right shoulder: Secondary | ICD-10-CM | POA: Insufficient documentation

## 2022-12-03 DIAGNOSIS — Z1589 Genetic susceptibility to other disease: Secondary | ICD-10-CM

## 2022-12-03 MED ORDER — KETOROLAC TROMETHAMINE 15 MG/ML IJ SOLN
15.0000 mg | Freq: Once | INTRAMUSCULAR | Status: AC
  Administered 2022-12-03: 15 mg via INTRAMUSCULAR
  Filled 2022-12-03: qty 1

## 2022-12-03 NOTE — ED Notes (Signed)
Fall risk armband Fall risk sign on door Fall risk socks

## 2022-12-03 NOTE — Discharge Instructions (Addendum)
It was a pleasure caring for you today.  X-ray without concern for fractures or dislocations.  Please follow-up with your primary care provider.  I have also put in information for orthopedic provider if you would like to call them later today.  Seek emergency care if experiencing any new or worsening symptoms.  Alternating between 650 mg Tylenol and 400 mg Advil: The best way to alternate taking Acetaminophen (example Tylenol) and Ibuprofen (example Advil/Motrin) is to take them 3 hours apart. For example, if you take ibuprofen at 6 am you can then take Tylenol at 9 am. You can continue this regimen throughout the day, making sure you do not exceed the recommended maximum dose for each drug.

## 2022-12-03 NOTE — ED Triage Notes (Signed)
Pt presents pov with complaints of right shoulder pain that radiates to the right arm and hand X3 days. Denies injury,chest pain. Also reports recurrent diarrhea X 3 weeks.

## 2022-12-03 NOTE — ED Provider Notes (Signed)
[facility] Provider Note   CSN: [id number] Arrival date & time: 12/03/22  0911     History  Chief Complaint  Patient presents with   Shoulder Pain   Diarrhea    Roald Lukacs is a 62 y.o. male with PMHx carpal tunnel syndrome, CHF, HLD, HTN who presents to ED concerned for right shoulder pain radiating into right arm x4 days. Denies recent trauma. Denies numbness/paresthesias. Had carpal tunnel release surgery on left arm years ago - has not followed up with surgery for right arm yet.   Patient also mentioning that he has had intermittent diarrhea x3 weeks and is following with PCP. Patient is more concerned about right shoulder pain today.  Denies fever, chest pain, dyspnea, cough, nausea, vomiting, abdominal pain, hematochezia. Denies recent ABX use, hx of crohn's/IBS, recent travel, drinking from unsafe water sources, immunocompromise, suspicious food intake.     Shoulder Pain Diarrhea      Home Medications Prior to Admission medications   Medication Sig Start Date End Date Taking? Authorizing Provider  amiodarone (PACERONE) 200 MG tablet TAKE 1 TABLET BY MOUTH DAILY 5 DAYS PER WEEK. MONDAY- FRIDAY 11/15/22   Duke Salvia, MD  apixaban (ELIQUIS) 5 MG TABS tablet Take 1 tablet (5 mg total) by mouth 2 (two) times daily. 11/28/22   Duke Salvia, MD  atorvastatin (LIPITOR) 80 MG tablet TAKE 1 TABLET BY MOUTH EVERY DAY 03/19/22   Duke Salvia, MD  carvedilol (COREG) 12.5 MG tablet Take 1 tablet (12.5 mg total) by mouth 2 (two) times daily with a meal. 10/23/21   Allayne Butcher, PA-C  Cholecalciferol (VITAMIN D3) 10 MCG (400 UNIT) tablet Take 400 Units by mouth daily.    [provider]  diclofenac Sodium (VOLTAREN) 1 % GEL Apply 2 g topically 4 (four) times daily. 11/06/21   Rana Snare, DO  empagliflozin (JARDIANCE) 10 MG TABS tablet Take 1 tablet (10 mg total) by mouth daily. 11/28/22   Duke Salvia, MD   fluticasone [facility]) 50 MCG/ACT nasal spray Place 1 spray into both nostrils as needed for allergies or rhinitis.    [provider]  furosemide (LASIX) 40 MG tablet Take 1 tablet (40 mg total) by mouth daily. 04/17/22   Duke Salvia, MD  methimazole (TAPAZOLE) 10 MG tablet TAKE 3 TABLETS BY MOUTH EVERY DAY 11/15/22   Duke Salvia, MD  OZEMPIC, 1 MG/DOSE, 4 MG/3ML SOPN Inject into the skin.    [provider]  potassium chloride SA (KLOR-CON M) 20 MEQ tablet Take 1 tablet (20 mEq total) by mouth daily. 02/08/21   Zannie Cove, MD  predniSONE (DELTASONE) 20 MG tablet Take 2 tablets by mouth (40mg ) by mouth daily x 30 days then 1 tablet (20mg ) by mouth daily x 30 days then 1/2 tablet (10mg ) x 30 days 10/11/22   Duke Salvia, MD  sacubitril-valsartan (ENTRESTO) 49-51 MG Take 1 tablet by mouth 2 (two) times daily. 08/29/22   Duke Salvia, MD  spironolactone (ALDACTONE) 25 MG tablet TAKE 0.5 TABLETS (12.5 MG TOTAL) BY MOUTH DAILY. NEEDS FOLLOW UP APPOINTMENT FOR MORE REFILLS 10/22/22   Bensimhon, Bevelyn Buckles, MD  tadalafil (CIALIS) 5 MG tablet Take 5 mg by mouth as directed. 02/12/22   [provider]      Allergies    Diphenhydramine hcl and Lisinopril    Review of Systems   Review of Systems  Gastrointestinal:  Positive for diarrhea.  Physical Exam Updated Vital Signs BP (!) 131/94 (BP Location: Right Arm)   Pulse 72   Temp 98.6 F (37 C) (Oral)   Resp 18   Ht 5\' 10"  (1.778 m)   SpO2 98%   BMI 45.74 kg/m  Physical Exam Vitals and nursing note reviewed.  Constitutional:      General: He is not in acute distress.    Appearance: He is not ill-appearing or toxic-appearing.  HENT:     Head: Normocephalic and atraumatic.     Mouth/Throat:     Mouth: Mucous membranes are moist.  Eyes:     General: No scleral icterus.       Right eye: No discharge.        Left eye: No discharge.     Conjunctiva/sclera: Conjunctivae normal.  Cardiovascular:      Rate and Rhythm: Normal rate and regular rhythm.     Pulses: Normal pulses.     Heart sounds: Normal heart sounds. No murmur heard. Pulmonary:     Effort: Pulmonary effort is normal. No respiratory distress.     Breath sounds: Normal breath sounds. No wheezing, rhonchi or rales.  Abdominal:     General: Abdomen is flat. Bowel sounds are normal. There is no distension.     Palpations: Abdomen is soft. There is no mass.     Tenderness: There is no abdominal tenderness.  Musculoskeletal:     Right lower leg: No edema.     Left lower leg: No edema.     Comments: Right shoulder: trapezius, deltoid, and bicep muscles tender to palpation. Normal active ROM of right shoulder but patient does endorse some pain. No swelling, erythema, or increased warmth. +2 radial pulse. Sensation to light touch intact. Brisk capillary refill.   Skin:    General: Skin is warm and dry.     Findings: No rash.  Neurological:     General: No focal deficit present.     Mental Status: He is alert. Mental status is at baseline.  Psychiatric:        Mood and Affect: Mood normal.     ED Results / Procedures / Treatments   Labs (all labs ordered are listed, but only abnormal results are displayed) Labs Reviewed - No data to display  EKG None  Radiology No results found.  Procedures Procedures    Medications Ordered in ED Medications  ketorolac (TORADOL) 15 MG/ML injection 15 mg (has no administration in time range)    ED Course/ Medical Decision Making/ A&P                                 Medical Decision Making Amount and/or Complexity of Data Reviewed Radiology: ordered.  Risk Prescription drug management.    This patient presents to the ED for concern of right shoulder pain, this involves an extensive number of treatment options, and is a complaint that carries with it a high risk of complications and morbidity.  The differential diagnosis includes hemarthrosis, gout, septic joint, fracture,  muscle strain, bursitis, compartment syndrome   Co morbidities that complicate the patient evaluation  carpal tunnel syndrome, CHF, HLD, HTN   Additional history obtained:  Dr. Janet Berlin PCP   Imaging Studies ordered:  I ordered imaging studies including  - right shoulder xray: to assess for osseous involvement given patient's symptoms I independently visualized and interpreted imaging Shared findings with patient I agree with the radiologist  interpretation   Problem List / ED Course / Critical interventions / Medication management  Patient presents to ED concern for right shoulder pain x 4 days.  Denies recent trauma/injury.  Physical exam with tenderness to right trapezius, deltoid, and bicep muscle.  Right shoulder ROM intact.  +2 radial pulses.  Sensation to light touch intact.  Brisk capillary refill.  Patient afebrile with stable vitals. X-ray without concern for fractures or dislocations.  Shared results with patient and recommended following up with PCP.  Will also provide patient with information for Ortho.  Patient verbalized understanding of plan.  Educated patient on alternating ibuprofen and Tylenol for pain relief.  Provided patient with Toradol in ED which resolved pain. As for patient's chronic intermittent diarrhea, patient is following with PCP. Denies any alarming history of diarrhea. Patient stating that he has not had recent colonoscopy.  Educated patient on the importance of obtaining colonoscopy.  Patient verbalized understanding of plan and stated that he will contact his GI doctor for colonoscopy later today. I have reviewed the patients home medicines and have made adjustments as needed Patient afebrile with stable vitals.  Provided with return precautions.  Discharged in condition.   Ddx these are considered less likely due to history of present illness and physical exam -hemarthrosis: joint without swelling; ROM intact -gout: no warmth or erythema; ROM  intact  -septic joint: afebrile; no warmth or erythema; no skin changes; ROM intact  -fracture: xray without concern  -compartment syndrome: area not tense; neurovascularly intact   Social Determinants of Health:  none         Final Clinical Impression(s) / ED Diagnoses Final diagnoses:  Acute pain of right shoulder    Rx / DC Orders ED Discharge Orders     None         Dorthy Cooler, [place] 12/03/22 1238    Virgina Norfolk, DO 12/03/22 1527

## 2022-12-04 ENCOUNTER — Encounter: Payer: Self-pay | Admitting: Medical Oncology

## 2022-12-04 ENCOUNTER — Inpatient Hospital Stay: Payer: Medicaid Other

## 2022-12-04 ENCOUNTER — Other Ambulatory Visit (HOSPITAL_COMMUNITY): Payer: Self-pay

## 2022-12-04 ENCOUNTER — Telehealth: Payer: Self-pay | Admitting: Pharmacist

## 2022-12-04 ENCOUNTER — Telehealth: Payer: Self-pay

## 2022-12-04 ENCOUNTER — Inpatient Hospital Stay: Payer: Medicaid Other | Attending: Medical Oncology

## 2022-12-04 ENCOUNTER — Inpatient Hospital Stay (HOSPITAL_BASED_OUTPATIENT_CLINIC_OR_DEPARTMENT_OTHER): Payer: Medicaid Other | Admitting: Medical Oncology

## 2022-12-04 ENCOUNTER — Other Ambulatory Visit: Payer: Self-pay

## 2022-12-04 VITALS — BP 126/86 | HR 69 | Temp 98.3°F | Resp 18 | Ht 70.0 in | Wt 322.0 lb

## 2022-12-04 DIAGNOSIS — D500 Iron deficiency anemia secondary to blood loss (chronic): Secondary | ICD-10-CM | POA: Diagnosis not present

## 2022-12-04 DIAGNOSIS — D649 Anemia, unspecified: Secondary | ICD-10-CM | POA: Insufficient documentation

## 2022-12-04 DIAGNOSIS — Z1589 Genetic susceptibility to other disease: Secondary | ICD-10-CM

## 2022-12-04 DIAGNOSIS — D751 Secondary polycythemia: Secondary | ICD-10-CM | POA: Insufficient documentation

## 2022-12-04 DIAGNOSIS — Z7901 Long term (current) use of anticoagulants: Secondary | ICD-10-CM | POA: Diagnosis not present

## 2022-12-04 LAB — CBC WITH DIFFERENTIAL (CANCER CENTER ONLY)
Abs Immature Granulocytes: 0.06 10*3/uL (ref 0.00–0.07)
Basophils Absolute: 0.1 10*3/uL (ref 0.0–0.1)
Basophils Relative: 1 %
Eosinophils Absolute: 0.3 10*3/uL (ref 0.0–0.5)
Eosinophils Relative: 2 %
HCT: 46.5 % (ref 39.0–52.0)
Hemoglobin: 12.7 g/dL — ABNORMAL LOW (ref 13.0–17.0)
Immature Granulocytes: 0 %
Lymphocytes Relative: 37 %
Lymphs Abs: 5.9 10*3/uL — ABNORMAL HIGH (ref 0.7–4.0)
MCH: 18.2 pg — ABNORMAL LOW (ref 26.0–34.0)
MCHC: 27.3 g/dL — ABNORMAL LOW (ref 30.0–36.0)
MCV: 66.6 fL — ABNORMAL LOW (ref 80.0–100.0)
Monocytes Absolute: 1 10*3/uL (ref 0.1–1.0)
Monocytes Relative: 6 %
Neutro Abs: 8.6 10*3/uL — ABNORMAL HIGH (ref 1.7–7.7)
Neutrophils Relative %: 54 %
Platelet Count: 336 10*3/uL (ref 150–400)
RBC: 6.98 MIL/uL — ABNORMAL HIGH (ref 4.22–5.81)
RDW: 24.2 % — ABNORMAL HIGH (ref 11.5–15.5)
Smear Review: ADEQUATE
WBC Count: 16 10*3/uL — ABNORMAL HIGH (ref 4.0–10.5)
nRBC: 0.1 % (ref 0.0–0.2)

## 2022-12-04 LAB — CMP (CANCER CENTER ONLY)
ALT: 31 U/L (ref 0–44)
AST: 21 U/L (ref 15–41)
Albumin: 4.4 g/dL (ref 3.5–5.0)
Alkaline Phosphatase: 58 U/L (ref 38–126)
Anion gap: 8 (ref 5–15)
BUN: 22 mg/dL (ref 8–23)
CO2: 21 mmol/L — ABNORMAL LOW (ref 22–32)
Calcium: 9 mg/dL (ref 8.9–10.3)
Chloride: 109 mmol/L (ref 98–111)
Creatinine: 1.38 mg/dL — ABNORMAL HIGH (ref 0.61–1.24)
GFR, Estimated: 58 mL/min — ABNORMAL LOW (ref >60)
Glucose, Bld: 94 mg/dL (ref 70–99)
Potassium: 4.2 mmol/L (ref 3.5–5.1)
Sodium: 138 mmol/L (ref 135–145)
Total Bilirubin: 0.8 mg/dL (ref <1.2)
Total Protein: 7 g/dL (ref 6.5–8.1)

## 2022-12-04 LAB — IRON AND IRON BINDING CAPACITY (CC-WL,HP ONLY)
Iron: 22 ug/dL — ABNORMAL LOW (ref 45–182)
Saturation Ratios: 6 % — ABNORMAL LOW (ref 17.9–39.5)
TIBC: 386 ug/dL (ref 250–450)
UIBC: 364 ug/dL (ref 117–376)

## 2022-12-04 LAB — FERRITIN: Ferritin: 9 ng/mL — ABNORMAL LOW (ref 24–336)

## 2022-12-04 MED ORDER — RUXOLITINIB PHOSPHATE 10 MG PO TABS
10.0000 mg | ORAL_TABLET | Freq: Two times a day (BID) | ORAL | 2 refills | Status: DC
  Filled 2022-12-12: qty 60, 30d supply, fill #0
  Filled 2023-01-02: qty 60, 30d supply, fill #1
  Filled 2023-02-06: qty 60, 30d supply, fill #2

## 2022-12-04 NOTE — Telephone Encounter (Signed)
Oral Oncology Patient Advocate Encounter  After completing a benefits investigation, prior authorization for Earvin Hansen is not required at this time through [facility].  Patient's copay is $4.00.     [name], CPhT Oncology Pharmacy Patient Advocate  [facility]  [phone] (phone) [phone] (fax) 12/04/2022 12:42 PM

## 2022-12-04 NOTE — Progress Notes (Signed)
Hematology and Oncology Follow Up Visit  Kamari Buch [id number] [birth year] 62 y.o. 12/04/2022   Principle Diagnosis:  JAK 2 positive    Current Therapy:        Phlebotomy to maintain Hct < 45% Eliquis   Interim History:  Mr. Letourneau is here today for follow-up  Today he reports that he is doing well. He went to the ER yesterday for neck pain and was diagnosed with a muscle strain. This is feeling better. He is about to go away to the [place] for vacation. He is excited about this.   His last phlebotomy was on 09/10/2022. Prior phlebotomies on:  07/2022, 06/18/2022, 05/01/2022, 03/19/2022, 02/18/2022, 02/12/2022, 01/01/2022, 12/24/2021, 12/18/2021, 12/04/2022, 11/26/2021, 11/19/2021, 11/12/2021.   Taking his Eliquis as directed. He denies any bleeding episodes.  No fever, chills, n/v, cough, rash, dizziness, SOB, chest pain, palpitations, abdominal pain. He has had some diarrhea.  No swelling, tenderness, numbness or tingling in his extremities at this time.  No falls or syncope.  He ambulates with a Rolator for added support.  Appetite and hydration are good  Wt Readings from Last 3 Encounters:  12/04/22 (!) 322 lb 0.6 oz (146.1 kg)  11/28/22 (!) 318 lb 12.8 oz (144.6 kg)  11/04/22 (!) 329 lb 6.4 oz (149.4 kg)   ECOG Performance Status: 1 - Symptomatic but completely ambulatory  Medications:  Allergies as of 12/04/2022       Reactions   Diphenhydramine Hcl Swelling   Lips swell   Lisinopril Cough        Medication List        Accurate as of December 04, 2022 11:12 AM. If you have any questions, ask your nurse or doctor.          Accu-Chek Guide test strip Generic drug: glucose blood 1 each by Other route as directed.   Accu-Chek Softclix Lancets lancets 1 each by Other route as directed.   amiodarone 200 MG tablet Commonly known as: PACERONE TAKE 1 TABLET BY MOUTH DAILY 5 DAYS PER WEEK. MONDAY- FRIDAY   apixaban 5 MG Tabs tablet Commonly known as:  Eliquis Take 1 tablet (5 mg total) by mouth 2 (two) times daily.   atorvastatin 80 MG tablet Commonly known as: LIPITOR TAKE 1 TABLET BY MOUTH EVERY DAY   carvedilol 12.5 MG tablet Commonly known as: COREG Take 1 tablet (12.5 mg total) by mouth 2 (two) times daily with a meal.   diclofenac Sodium 1 % Gel Commonly known as: VOLTAREN Apply 2 g topically 4 (four) times daily.   empagliflozin 10 MG Tabs tablet Commonly known as: Jardiance Take 1 tablet (10 mg total) by mouth daily.   Entresto 49-51 MG Generic drug: sacubitril-valsartan Take 1 tablet by mouth 2 (two) times daily.   fluticasone 50 MCG/ACT nasal spray Commonly known as: FLONASE Place 1 spray into both nostrils as needed for allergies or rhinitis.   furosemide 40 MG tablet Commonly known as: LASIX Take 1 tablet (40 mg total) by mouth daily.   methimazole 10 MG tablet Commonly known as: TAPAZOLE TAKE 3 TABLETS BY MOUTH EVERY DAY   potassium chloride SA 20 MEQ tablet Commonly known as: KLOR-CON M Take 1 tablet (20 mEq total) by mouth daily.   predniSONE 20 MG tablet Commonly known as: DELTASONE Take 2 tablets by mouth (40mg ) by mouth daily x 30 days then 1 tablet (20mg ) by mouth daily x 30 days then 1/2 tablet (10mg ) x 30 days   Semaglutide (2 MG/DOSE) 8  MG/3ML Sopn Inject 2 mg into the skin once a week. What changed: Another medication with the same name was removed. Continue taking this medication, and follow the directions you see here. Changed by: Rushie Chestnut   spironolactone 25 MG tablet Commonly known as: ALDACTONE TAKE 0.5 TABLETS (12.5 MG TOTAL) BY MOUTH DAILY. NEEDS FOLLOW UP APPOINTMENT FOR MORE REFILLS   tadalafil 5 MG tablet Commonly known as: CIALIS Take 5 mg by mouth as directed.   Vitamin D3 10 MCG (400 UNIT) tablet Take 400 Units by mouth daily.        Allergies:  Allergies  Allergen Reactions   Diphenhydramine Hcl Swelling    Lips swell     Lisinopril Cough    Past  Medical History, Surgical history, Social history, and Family History were reviewed and updated.  Review of Systems: All other 10 point review of systems is negative.   Physical Exam:  height is 5\' 10"  (1.778 m) and weight is 322 lb 0.6 oz (146.1 kg) (abnormal). His oral temperature is 98.3 F (36.8 C). His blood pressure is 126/86 and his pulse is 69. His respiration is 18 and oxygen saturation is 100%.   Wt Readings from Last 3 Encounters:  12/04/22 (!) 322 lb 0.6 oz (146.1 kg)  11/28/22 (!) 318 lb 12.8 oz (144.6 kg)  11/04/22 (!) 329 lb 6.4 oz (149.4 kg)   General: Using a Rolator  Ocular: Sclerae unicteric, pupils equal, round and reactive to light Ear-nose-throat: Oropharynx clear, dentition fair Lymphatic: No cervical or supraclavicular adenopathy Lungs no rales or rhonchi, good excursion bilaterally Heart regular rate and rhythm, no murmur appreciated Abd soft, nontender, positive bowel sounds MSK no focal spinal tenderness, no joint edema Neuro: non-focal, well-oriented, appropriate affect   Lab Results  Component Value Date   WBC 16.9 (H) 11/04/2022   HGB 11.8 (L) 11/04/2022   HCT 43.8 11/04/2022   MCV 66.1 (L) 11/04/2022   PLT 284 11/04/2022   Lab Results  Component Value Date   FERRITIN 10 (L) 09/10/2022   IRON 26 (L) 09/10/2022   TIBC 358 09/10/2022   UIBC 332 09/10/2022   IRONPCTSAT 7 (L) 09/10/2022   Lab Results  Component Value Date   RETICCTPCT 1.4 11/12/2021   RBC 6.63 (H) 11/04/2022   No results found for: "KPAFRELGTCHN", "LAMBDASER", "KAPLAMBRATIO" No results found for: "IGGSERUM", "IGA", "IGMSERUM" No results found for: "TOTALPROTELP", "ALBUMINELP", "A1GS", "A2GS", "BETS", "BETA2SER", "GAMS", "MSPIKE", "SPEI"   Chemistry      Component Value Date/Time   NA 138 11/04/2022 0803   NA 141 03/15/2022 0857   K 3.4 (L) 11/04/2022 0803   CL 108 11/04/2022 0803   CO2 20 (L) 11/04/2022 0803   BUN 16 11/04/2022 0803   BUN 13 03/15/2022 0857    CREATININE 1.27 (H) 11/04/2022 0803   CREATININE 1.35 (H) 10/25/2022 0858   CREATININE 1.11 09/09/2014 1017      Component Value Date/Time   CALCIUM 8.1 (L) 11/04/2022 0803   ALKPHOS 63 11/04/2022 0803   AST 25 11/04/2022 0803   AST 25 10/25/2022 0858   ALT 43 11/04/2022 0803   ALT 45 (H) 10/25/2022 0858   BILITOT 0.7 11/04/2022 0803   BILITOT 0.7 10/25/2022 0858      Encounter Diagnoses  Name Primary?   JAK-2 gene mutation Yes   Iron deficiency anemia due to chronic blood loss    Erythrocytosis     Impression and Plan: Mr. Colmenares is a very pleasant 62 yo  African American gentleman with positive JAK 2.   Today HCT is 46.5Hgb 12.7. After discussing in detail we have elected to HOLD his phlebotomy today given his anemia. He will hydrate with water and continue his Eliquis. We will start him on [facility] and recheck labs in 4 weeks.  Again we discussed Jakifi risks/benefits. He would like to start on this medication. His CC is 115 so no renal dose adjustment is needed. Will start at 10 mg BID. We will see him again in 4 weeks for follow up and lab monitoring. In the meantime we discussed his anemia. Likely from phlebotomies but I have recommended GI evaluation as this is new and he is overdue for colon cancer screening.  He is agreeable and will discuss this with his PCP at his upcoming visit.  Jakifi: If response is insufficient and platelet, hemoglobin, and neutrophil counts are adequate, the dose may be increased in 5 mg twice daily increments to a maximum of 25 mg twice daily. Do not increase dose in the first 4 weeks of treatment and not more frequently than every 2 weeks. Consider dose increases in patients who meet all of the following conditions: - Inadequate efficacy demonstrated by one or more of the following: Continued need for phlebotomy, WBC >ULN of normal range, platelet count >ULN of normal range, or palpable spleen that is reduced by <25% from baseline. - Platelet count  >=140,000/mm3 - Hemoglobin >=12 g/dL - ANC >=3,086/VH8  Disposition Starting [place] today 10 mg BID Iron studies pending. He will increase oral intake of iron rich foods.  Colonoscopy recommended  RTC 1 month MD, labs(CBC, CMP, ferritin, iron, hepatitis studies, lipids)/+-Phlebotomy with fluid replacement    Rushie Chestnut, PA-C 11/6/202411:12 AM

## 2022-12-04 NOTE — Telephone Encounter (Signed)
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  CBC w/ Diff and CMP from 12/04/22 assessed, noted patient Scr of 1.38 mg/dL (CrCl ~40.9 mL/min) - no baseline renal dose adjustments required at this time. Prescription dose and frequency assessed for appropriateness.  Current medication list in Epic reviewed, DDIs with Jakafi identified: Category C drug-drug interaction between Jakafi and Methimazole - methimazole can increase neutropenic effects of Jakafi, recommend monitoring patient's WBC. No changes in therapy warranted at this time.    Evaluated chart and no patient barriers to medication adherence noted.   Prescription has been e-scribed to the [facility] for benefits analysis and approval.  Oral Oncology Clinic will continue to follow for insurance authorization, copayment issues, initial counseling and start date.  Lenord Carbo, PharmD, BCPS, BCOP Hematology/Oncology Clinical Pharmacist [facility] Oral Chemotherapy Navigation Clinics [phone] 12/04/2022 1:25 PM

## 2022-12-05 ENCOUNTER — Telehealth: Payer: Self-pay

## 2022-12-05 ENCOUNTER — Other Ambulatory Visit: Payer: Self-pay | Admitting: Internal Medicine

## 2022-12-05 NOTE — Telephone Encounter (Signed)
-----   Message from Rushie Chestnut sent at 12/05/2022  1:30 PM EST ----- Iron rich foods recommended

## 2022-12-05 NOTE — Telephone Encounter (Signed)
Called and informed patient of lab results, patient verbalized understanding and denies any questions or concerns at this time.  Discussed a list of iron rich foods with pt.

## 2022-12-06 ENCOUNTER — Emergency Department (HOSPITAL_BASED_OUTPATIENT_CLINIC_OR_DEPARTMENT_OTHER)
Admission: EM | Admit: 2022-12-06 | Discharge: 2022-12-06 | Disposition: A | Discharge status: Home / Self Care | Payer: Medicaid Other | Attending: Emergency Medicine | Admitting: Emergency Medicine

## 2022-12-06 ENCOUNTER — Encounter (HOSPITAL_BASED_OUTPATIENT_CLINIC_OR_DEPARTMENT_OTHER): Payer: Self-pay

## 2022-12-06 ENCOUNTER — Other Ambulatory Visit: Payer: Self-pay

## 2022-12-06 ENCOUNTER — Telehealth: Payer: Self-pay | Admitting: Internal Medicine

## 2022-12-06 DIAGNOSIS — Z87891 Personal history of nicotine dependence: Secondary | ICD-10-CM | POA: Diagnosis not present

## 2022-12-06 DIAGNOSIS — M25552 Pain in left hip: Secondary | ICD-10-CM | POA: Diagnosis not present

## 2022-12-06 DIAGNOSIS — M25551 Pain in right hip: Secondary | ICD-10-CM | POA: Insufficient documentation

## 2022-12-06 DIAGNOSIS — Z79899 Other long term (current) drug therapy: Secondary | ICD-10-CM | POA: Diagnosis not present

## 2022-12-06 DIAGNOSIS — I110 Hypertensive heart disease with heart failure: Secondary | ICD-10-CM | POA: Insufficient documentation

## 2022-12-06 DIAGNOSIS — R21 Rash and other nonspecific skin eruption: Secondary | ICD-10-CM | POA: Diagnosis present

## 2022-12-06 DIAGNOSIS — Z7901 Long term (current) use of anticoagulants: Secondary | ICD-10-CM | POA: Insufficient documentation

## 2022-12-06 DIAGNOSIS — I5043 Acute on chronic combined systolic (congestive) and diastolic (congestive) heart failure: Secondary | ICD-10-CM | POA: Insufficient documentation

## 2022-12-06 LAB — COMPREHENSIVE METABOLIC PANEL
ALT: 38 U/L (ref 0–44)
AST: 26 U/L (ref 15–41)
Albumin: 4 g/dL (ref 3.5–5.0)
Alkaline Phosphatase: 57 U/L (ref 38–126)
Anion gap: 10 (ref 5–15)
BUN: 21 mg/dL (ref 8–23)
CO2: 22 mmol/L (ref 22–32)
Calcium: 9 mg/dL (ref 8.9–10.3)
Chloride: 104 mmol/L (ref 98–111)
Creatinine, Ser: 1.4 mg/dL — ABNORMAL HIGH (ref 0.61–1.24)
GFR, Estimated: 57 mL/min — ABNORMAL LOW (ref >60)
Glucose, Bld: 112 mg/dL — ABNORMAL HIGH (ref 70–99)
Potassium: 3.9 mmol/L (ref 3.5–5.1)
Sodium: 136 mmol/L (ref 135–145)
Total Bilirubin: 0.9 mg/dL (ref <1.2)
Total Protein: 7.3 g/dL (ref 6.5–8.1)

## 2022-12-06 LAB — CBC WITH DIFFERENTIAL/PLATELET
Abs Immature Granulocytes: 0.07 10*3/uL (ref 0.00–0.07)
Basophils Absolute: 0.1 10*3/uL (ref 0.0–0.1)
Basophils Relative: 1 %
Eosinophils Absolute: 0.2 10*3/uL (ref 0.0–0.5)
Eosinophils Relative: 2 %
HCT: 48.8 % (ref 39.0–52.0)
Hemoglobin: 13.2 g/dL (ref 13.0–17.0)
Immature Granulocytes: 1 %
Lymphocytes Relative: 11 %
Lymphs Abs: 1.2 10*3/uL (ref 0.7–4.0)
MCH: 17.8 pg — ABNORMAL LOW (ref 26.0–34.0)
MCHC: 27 g/dL — ABNORMAL LOW (ref 30.0–36.0)
MCV: 65.9 fL — ABNORMAL LOW (ref 80.0–100.0)
Monocytes Absolute: 0.5 10*3/uL (ref 0.1–1.0)
Monocytes Relative: 5 %
Neutro Abs: 8.7 10*3/uL — ABNORMAL HIGH (ref 1.7–7.7)
Neutrophils Relative %: 80 %
Platelets: 377 10*3/uL (ref 150–400)
RBC: 7.4 MIL/uL — ABNORMAL HIGH (ref 4.22–5.81)
RDW: 24.2 % — ABNORMAL HIGH (ref 11.5–15.5)
Smear Review: NORMAL
WBC: 10.8 10*3/uL — ABNORMAL HIGH (ref 4.0–10.5)
nRBC: 0 % (ref 0.0–0.2)

## 2022-12-06 LAB — CK: Total CK: 79 U/L (ref 49–397)

## 2022-12-06 NOTE — ED Triage Notes (Signed)
The patient is having hip pain over the past week. He has some blisters on his hands. He stated he had a shot for pain two days ago and that's when the blisters started.

## 2022-12-06 NOTE — Telephone Encounter (Signed)
Pt c/o medication issue:  1. Name of Medication:   methimazole (TAPAZOLE) 10 MG tablet    2. How are you currently taking this medication (dosage and times per day)? As written  3. Are you having a reaction (difficulty breathing--STAT)? no  4. What is your medication issue? Medication is causing pt to have diarrhea  and he wants Dr Graciela Husbands to know. Pt wants to know if he should stop it or is there something else he can take. Pt would like a call back

## 2022-12-06 NOTE — Telephone Encounter (Signed)
Dr. Graciela Husbands prescribed prednisone to this pt. Pharmacy is asking for a refill. Pt has a followup in December with Maxine Glenn PA. Does Dr. Graciela Husbands want to refill?
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
## 2022-12-06 NOTE — Discharge Instructions (Addendum)
We evaluated you for your rash. I am not sure of the exact cause of your rash. I don't think it is caused by any dangerous process such as a severe drug reaction or a bacterial infection.   Please keep a close eye on your rash. If you notice any worsening of your rash, or you notice any rash inside your mouth or sores in your mouth, fevers or chills, increasing pain, nausea or vomiting, or any other new symptoms, please return to the emergency department.  Please follow-up with your primary doctor.  If your rash fails to improve you may need to see a dermatologist.

## 2022-12-06 NOTE — Telephone Encounter (Signed)
Spoke with pt who states he continues to have watery, diarrhea stools several times per day.  Pt states he held his Methimazole yesterday 11/7 and did not have diarrhea yesterday.  Pt states he took medication this morning and has developed diarrhea again.  Pt states he really believes it is the Methimazole causing this issue and would like to know if there is another medication to try in it's place.  Pt advised Dr Graciela Husbands is out of the office but will forward to him and our pharmacy team for further review and recommendation.  Pt advised not to abruptly stop medication.  Pt verbalizes understanding and agrees with current plan.

## 2022-12-08 NOTE — Telephone Encounter (Signed)
Looks as though methimazole was prescribed by PCP.   Would suggest patient reach out to PCP for suggestions on alternative

## 2022-12-10 NOTE — Telephone Encounter (Signed)
Attempted phone call to pt and left voicemail message to contact office at [phone]. 

## 2022-12-10 NOTE — Telephone Encounter (Signed)
Apparently he had this complaint when we saw him in the office and at quick glance did not see it associated with methimazole>> would have him followup with PCP ( can stay off for a bit) to get treatment directed to his hyperthyroidism ,either by PCP or by PCP referral to endocrine  Thanks SK

## 2022-12-12 ENCOUNTER — Other Ambulatory Visit: Payer: Self-pay

## 2022-12-12 ENCOUNTER — Other Ambulatory Visit (HOSPITAL_COMMUNITY): Payer: Self-pay

## 2022-12-12 NOTE — Progress Notes (Signed)
Oral Chemotherapy Pharmacist Encounter  Patient was counseled under telephone encounter from 12/04/22.  John Conner, PharmD, BCPS, [facility] Hematology/Oncology Clinical Pharmacist [facility] and [facility] Oral Chemotherapy Navigation Clinics [phone] 12/12/2022 11:54 AM

## 2022-12-12 NOTE — Progress Notes (Signed)
Specialty Pharmacy Initial Fill Coordination Note  John Conner is a 62 y.o. male contacted today regarding refills of specialty medication(s) Ruxolitinib Phosphate (Antineoplastic Enzyme Inhibitors)   Patient requested Delivery   Delivery date: 12/16/22   Verified address: [address] [place], [place] [address]   Medication will be filled on 12/13/22.   Patient is aware of $4.00 copayment. Patient requests bill to [place]   [name], CPhT Oncology Pharmacy Patient Advocate  [facility]  [phone] (phone) [phone] (fax) 12/12/2022 11:46 AM

## 2022-12-12 NOTE — Telephone Encounter (Signed)
Patient successfully OnBoarded and drug education provided by pharmacist. Medication scheduled to be shipped on Friday, 12/13/22, for delivery on Monday, 12/16/22, from [facility] to patient's address. Patient also knows to call me at [phone] with any questions or concerns regarding receiving medication or if there is any unexpected change in co-pay.    [name], CPhT Oncology Pharmacy Patient Advocate  [facility]  [phone] (phone) [phone] (fax) 12/12/2022 11:53 AM

## 2022-12-12 NOTE — Telephone Encounter (Signed)
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   Counseled patient on administration, dosing, side effects, monitoring, drug-food interactions, safe handling, storage, and disposal.  Patient will take Jakafi 10mg  tablets, 1 tablet by mouth 2 times daily, with or without food.  Patient knows to avoid grapefruit and grapefruit juice.  Jakafi start date: 12/16/22  Adverse effects include but are not limited to: decreased blood counts, increased liver enzymes, dizziness, headache, diarrhea, increased risk of some infections (including bacterial, mycobacterial, fungal, or viral).  Reviewed with patient importance of keeping a medication schedule and plan for any missed doses. No barriers to medication adherence identified.  Medication reconciliation performed and medication/allergy list updated.  All questions answered.  John Conner voiced understanding and appreciation.   Medication education handout placed in mail for patient. Patient knows to call the office with questions or concerns. Oral Chemotherapy Clinic phone number provided to patient.   Lenord Carbo, PharmD, BCPS, [facility] Hematology/Oncology Clinical Pharmacist [facility] and [facility] Oral Chemotherapy Navigation Clinics [phone] 12/12/2022 11:52 AM

## 2022-12-12 NOTE — Telephone Encounter (Signed)
Spoke with pt and advised pt per Dr Graciela Husbands he can stay off Methimazole for a bit" but will need to follow up with PCP or Endocrinology as soon as possible for further treatment for hyperthyroidism.  Pt verbalizes understanding and agrees with current plan.

## 2022-12-13 ENCOUNTER — Emergency Department (HOSPITAL_BASED_OUTPATIENT_CLINIC_OR_DEPARTMENT_OTHER)
Admission: EM | Admit: 2022-12-13 | Discharge: 2022-12-13 | Discharge status: Against Medical Advice | Payer: Medicaid Other | Attending: Emergency Medicine | Admitting: Emergency Medicine

## 2022-12-13 ENCOUNTER — Encounter (HOSPITAL_BASED_OUTPATIENT_CLINIC_OR_DEPARTMENT_OTHER): Payer: Self-pay

## 2022-12-13 ENCOUNTER — Other Ambulatory Visit: Payer: Self-pay

## 2022-12-13 DIAGNOSIS — Z5321 Procedure and treatment not carried out due to patient leaving prior to being seen by health care provider: Secondary | ICD-10-CM | POA: Diagnosis not present

## 2022-12-13 DIAGNOSIS — R21 Rash and other nonspecific skin eruption: Secondary | ICD-10-CM | POA: Insufficient documentation

## 2022-12-13 NOTE — ED Triage Notes (Signed)
Patient here POV from Home.  Endorses Rash to Right Hand and forearm. Was seen for same 7 days ago. Discharged but returns for further evaluation as it has worsened and become more painful.   No N/V. No Fevers.  NAD Noted during Triage. A&Ox4. GCS 15. Ambulatory with Cane.

## 2022-12-18 ENCOUNTER — Other Ambulatory Visit: Payer: Self-pay

## 2022-12-18 NOTE — Progress Notes (Signed)
John Conner was not shipped on 11/15 as intended. Medication will be same day couriered to patient home on 12/19/22. Patient and Clinic [facility] aware.

## 2022-12-19 ENCOUNTER — Other Ambulatory Visit: Payer: Self-pay

## 2023-01-02 ENCOUNTER — Inpatient Hospital Stay (HOSPITAL_BASED_OUTPATIENT_CLINIC_OR_DEPARTMENT_OTHER): Payer: Medicaid Other | Admitting: Hematology & Oncology

## 2023-01-02 ENCOUNTER — Other Ambulatory Visit: Payer: Self-pay

## 2023-01-02 ENCOUNTER — Inpatient Hospital Stay: Payer: Medicaid Other

## 2023-01-02 ENCOUNTER — Encounter: Payer: Self-pay | Admitting: Hematology & Oncology

## 2023-01-02 ENCOUNTER — Inpatient Hospital Stay: Payer: Medicaid Other | Attending: Medical Oncology

## 2023-01-02 VITALS — BP 99/54 | HR 69

## 2023-01-02 VITALS — BP 118/86 | HR 65 | Temp 97.5°F | Resp 18 | Ht 70.0 in | Wt 322.0 lb

## 2023-01-02 DIAGNOSIS — D751 Secondary polycythemia: Secondary | ICD-10-CM | POA: Insufficient documentation

## 2023-01-02 DIAGNOSIS — D45 Polycythemia vera: Secondary | ICD-10-CM | POA: Diagnosis not present

## 2023-01-02 DIAGNOSIS — Z1589 Genetic susceptibility to other disease: Secondary | ICD-10-CM

## 2023-01-02 DIAGNOSIS — D500 Iron deficiency anemia secondary to blood loss (chronic): Secondary | ICD-10-CM

## 2023-01-02 DIAGNOSIS — Z7901 Long term (current) use of anticoagulants: Secondary | ICD-10-CM | POA: Diagnosis not present

## 2023-01-02 HISTORY — DX: Polycythemia vera: D45

## 2023-01-02 LAB — HEPATITIS B SURFACE ANTIBODY,QUALITATIVE: Hep B S Ab: NONREACTIVE

## 2023-01-02 LAB — CMP (CANCER CENTER ONLY)
ALT: 44 U/L (ref 0–44)
AST: 26 U/L (ref 15–41)
Albumin: 4.3 g/dL (ref 3.5–5.0)
Alkaline Phosphatase: 58 U/L (ref 38–126)
Anion gap: 8 (ref 5–15)
BUN: 19 mg/dL (ref 8–23)
CO2: 24 mmol/L (ref 22–32)
Calcium: 8.9 mg/dL (ref 8.9–10.3)
Chloride: 104 mmol/L (ref 98–111)
Creatinine: 1.35 mg/dL — ABNORMAL HIGH (ref 0.61–1.24)
GFR, Estimated: 59 mL/min — ABNORMAL LOW (ref >60)
Glucose, Bld: 86 mg/dL (ref 70–99)
Potassium: 3.8 mmol/L (ref 3.5–5.1)
Sodium: 136 mmol/L (ref 135–145)
Total Bilirubin: 0.7 mg/dL (ref <1.2)
Total Protein: 7.2 g/dL (ref 6.5–8.1)

## 2023-01-02 LAB — HEPATITIS C ANTIBODY: HCV Ab: NONREACTIVE

## 2023-01-02 LAB — CBC WITH DIFFERENTIAL (CANCER CENTER ONLY)
Abs Immature Granulocytes: 0.04 10*3/uL (ref 0.00–0.07)
Basophils Absolute: 0.1 10*3/uL (ref 0.0–0.1)
Basophils Relative: 1 %
Eosinophils Absolute: 0.1 10*3/uL (ref 0.0–0.5)
Eosinophils Relative: 1 %
HCT: 46.9 % (ref 39.0–52.0)
Hemoglobin: 13 g/dL (ref 13.0–17.0)
Immature Granulocytes: 0 %
Lymphocytes Relative: 45 %
Lymphs Abs: 6.4 10*3/uL — ABNORMAL HIGH (ref 0.7–4.0)
MCH: 19.1 pg — ABNORMAL LOW (ref 26.0–34.0)
MCHC: 27.7 g/dL — ABNORMAL LOW (ref 30.0–36.0)
MCV: 68.8 fL — ABNORMAL LOW (ref 80.0–100.0)
Monocytes Absolute: 0.9 10*3/uL (ref 0.1–1.0)
Monocytes Relative: 6 %
Neutro Abs: 6.7 10*3/uL (ref 1.7–7.7)
Neutrophils Relative %: 47 %
Platelet Count: 463 10*3/uL — ABNORMAL HIGH (ref 150–400)
RBC: 6.82 MIL/uL — ABNORMAL HIGH (ref 4.22–5.81)
RDW: 24.8 % — ABNORMAL HIGH (ref 11.5–15.5)
Smear Review: NORMAL
WBC Count: 14.2 10*3/uL — ABNORMAL HIGH (ref 4.0–10.5)
nRBC: 0 % (ref 0.0–0.2)

## 2023-01-02 LAB — IRON AND IRON BINDING CAPACITY (CC-WL,HP ONLY)
Iron: 66 ug/dL (ref 45–182)
Saturation Ratios: 15 % — ABNORMAL LOW (ref 17.9–39.5)
TIBC: 442 ug/dL (ref 250–450)
UIBC: 376 ug/dL (ref 117–376)

## 2023-01-02 LAB — LIPID PANEL
Cholesterol: 211 mg/dL — ABNORMAL HIGH (ref 0–200)
HDL: 133 mg/dL (ref >40)
LDL Cholesterol: 68 mg/dL (ref 0–99)
Total CHOL/HDL Ratio: 1.6 {ratio}
Triglycerides: 49 mg/dL (ref <150)
VLDL: 10 mg/dL (ref 0–40)

## 2023-01-02 LAB — FERRITIN: Ferritin: 14 ng/mL — ABNORMAL LOW (ref 24–336)

## 2023-01-02 LAB — HEPATITIS B CORE ANTIBODY, TOTAL: Hep B Core Total Ab: NONREACTIVE

## 2023-01-02 LAB — HEPATITIS B SURFACE ANTIGEN: Hepatitis B Surface Ag: NONREACTIVE

## 2023-01-02 MED ORDER — SODIUM CHLORIDE 0.9 % IV SOLN: Freq: Once | INTRAVENOUS | Status: AC

## 2023-01-02 NOTE — Progress Notes (Signed)
Hematology and Oncology Follow Up Visit  John Conner [id number] Aug 11, 1960 62 y.o. 01/02/2023   Principle Diagnosis:  Polycythemia vera  -JAK 2 positive    Current Therapy:   Eliquis 5 mg p.o. twice daily      Phlebotomy to maintain Hct < 45%   Interim History:  [name] is here today for follow-up.  He is doing okay.  He was down in [place] for Thanksgiving.  He had a nice time down in [place].  I think he had a sister who had a birthday.  He feels okay.  He is retired.  He used to work in a [employer].  He has had no problem with headache.  There is been no bleeding.  He has had no nausea or vomiting.  There is been no change in bowel or bladder habits.  He has had no rashes.  There is really been no leg swelling.  He continues on Ozempic.  Overall, I would say his performance status is probably ECOG 1.    Wt Readings from Last 3 Encounters:  01/02/23 (!) 322 lb (146.1 kg)  12/13/22 (!) 320 lb (145.2 kg)  12/06/22 (!) 322 lb (146.1 kg)   Medications:  Allergies as of 01/02/2023       Reactions   Diphenhydramine Hcl Swelling   Lips swell   Lisinopril Cough        Medication List        Accurate as of January 02, 2023  9:17 AM. If you have any questions, ask your nurse or doctor.          Accu-Chek Guide test strip Generic drug: glucose blood 1 each by Other route as directed.   Accu-Chek Softclix Lancets lancets 1 each by Other route as directed.   amiodarone 200 MG tablet Commonly known as: PACERONE TAKE 1 TABLET BY MOUTH DAILY 5 DAYS PER WEEK. MONDAY- FRIDAY   apixaban 5 MG Tabs tablet Commonly known as: Eliquis Take 1 tablet (5 mg total) by mouth 2 (two) times daily.   atorvastatin 80 MG tablet Commonly known as: LIPITOR TAKE 1 TABLET BY MOUTH EVERY DAY   carvedilol 12.5 MG tablet Commonly known as: COREG Take 1 tablet (12.5 mg total) by mouth 2 (two) times daily with a meal.   diclofenac Sodium 1 % Gel Commonly known as: VOLTAREN Apply 2  g topically 4 (four) times daily.   empagliflozin 10 MG Tabs tablet Commonly known as: Jardiance Take 1 tablet (10 mg total) by mouth daily.   Entresto 49-51 MG Generic drug: sacubitril-valsartan Take 1 tablet by mouth 2 (two) times daily.   fluticasone 50 MCG/ACT nasal spray Commonly known as: FLONASE Place 1 spray into both nostrils as needed for allergies or rhinitis.   furosemide 40 MG tablet Commonly known as: LASIX Take 1 tablet (40 mg total) by mouth daily.   Jakafi 10 MG tablet Generic drug: ruxolitinib phosphate Take 1 tablet (10 mg total) by mouth 2 (two) times daily.   methimazole 10 MG tablet Commonly known as: TAPAZOLE TAKE 3 TABLETS BY MOUTH EVERY DAY   mometasone 0.1 % cream Commonly known as: ELOCON Apply 1 Application topically daily.   potassium chloride SA 20 MEQ tablet Commonly known as: KLOR-CON M Take 1 tablet (20 mEq total) by mouth daily.   predniSONE 20 MG tablet Commonly known as: DELTASONE Take 2 tablets by mouth (40mg ) by mouth daily x 30 days then 1 tablet (20mg ) by mouth daily x 30 days then 1/2  tablet (10mg ) x 30 days   Semaglutide (2 MG/DOSE) 8 MG/3ML Sopn Inject 2 mg into the skin once a week.   spironolactone 25 MG tablet Commonly known as: ALDACTONE TAKE 0.5 TABLETS (12.5 MG TOTAL) BY MOUTH DAILY. NEEDS FOLLOW UP APPOINTMENT FOR MORE REFILLS   tadalafil 5 MG tablet Commonly known as: CIALIS Take 5 mg by mouth as directed.   Vitamin D3 10 MCG (400 UNIT) tablet Take 400 Units by mouth daily.        Allergies:  Allergies  Allergen Reactions   Diphenhydramine Hcl Swelling    Lips swell     Lisinopril Cough    Past Medical History, Surgical history, Social history, and Family History were reviewed and updated.  Review of Systems: Review of Systems  Constitutional: Negative.   HENT: Negative.    Eyes: Negative.   Respiratory: Negative.    Cardiovascular: Negative.   Gastrointestinal: Negative.   Genitourinary:  Negative.   Musculoskeletal: Negative.   Skin: Negative.   Neurological: Negative.   Endo/Heme/Allergies: Negative.   Psychiatric/Behavioral: Negative.     Marland Kitchen   Physical Exam:  height is 5\' 10"  (1.778 m) and weight is 322 lb (146.1 kg) (abnormal). His oral temperature is 97.5 F (36.4 C) (abnormal). His blood pressure is 118/86 and his pulse is 65. His respiration is 18 and oxygen saturation is 95%.   Wt Readings from Last 3 Encounters:  01/02/23 (!) 322 lb (146.1 kg)  12/13/22 (!) 320 lb (145.2 kg)  12/06/22 (!) 322 lb (146.1 kg)   Physical Exam Vitals reviewed.  HENT:     Head: Normocephalic and atraumatic.  Eyes:     Pupils: Pupils are equal, round, and reactive to light.  Cardiovascular:     Rate and Rhythm: Normal rate and regular rhythm.     Heart sounds: Normal heart sounds.  Pulmonary:     Effort: Pulmonary effort is normal.     Breath sounds: Normal breath sounds.  Abdominal:     General: Bowel sounds are normal.     Palpations: Abdomen is soft.  Musculoskeletal:        General: No tenderness or deformity. Normal range of motion.     Cervical back: Normal range of motion.  Lymphadenopathy:     Cervical: No cervical adenopathy.  Skin:    General: Skin is warm and dry.     Findings: No erythema or rash.  Neurological:     Mental Status: He is alert and oriented to person, place, and time.  Psychiatric:        Behavior: Behavior normal.        Thought Content: Thought content normal.        Judgment: Judgment normal.       Lab Results  Component Value Date   WBC 14.2 (H) 01/02/2023   HGB 13.0 01/02/2023   HCT 46.9 01/02/2023   MCV 68.8 (L) 01/02/2023   PLT 463 (H) 01/02/2023   Lab Results  Component Value Date   FERRITIN 9 (L) 12/04/2022   IRON 22 (L) 12/04/2022   TIBC 386 12/04/2022   UIBC 364 12/04/2022   IRONPCTSAT 6 (L) 12/04/2022   Lab Results  Component Value Date   RETICCTPCT 1.4 11/12/2021   RBC 6.82 (H) 01/02/2023   No results  found for: "KPAFRELGTCHN", "LAMBDASER", "KAPLAMBRATIO" No results found for: "IGGSERUM", "IGA", "IGMSERUM" No results found for: "TOTALPROTELP", "ALBUMINELP", "A1GS", "A2GS", "BETS", "BETA2SER", "GAMS", "MSPIKE", "SPEI"   Chemistry  Component Value Date/Time   NA 136 12/06/2022 1434   NA 141 03/15/2022 0857   K 3.9 12/06/2022 1434   CL 104 12/06/2022 1434   CO2 22 12/06/2022 1434   BUN 21 12/06/2022 1434   BUN 13 03/15/2022 0857   CREATININE 1.40 (H) 12/06/2022 1434   CREATININE 1.38 (H) 12/04/2022 1046   CREATININE 1.11 09/09/2014 1017      Component Value Date/Time   CALCIUM 9.0 12/06/2022 1434   ALKPHOS 57 12/06/2022 1434   AST 26 12/06/2022 1434   AST 21 12/04/2022 1046   ALT 38 12/06/2022 1434   ALT 31 12/04/2022 1046   BILITOT 0.9 12/06/2022 1434   BILITOT 0.8 12/04/2022 1046      Impression and Plan: Mr. Goerlitz is a very pleasant 62 yo African American gentleman with polycythemia vera.  He is positive for the JAK 2 mutation.   We will go ahead and phlebotomize him today.  I still do not think that we had put on any type of myelosuppressive agent.  His labs really do not look all that bad.  We will plan to get him back in another 6 weeks or so.  We will get him through the Holiday season.  He is on Eliquis so we do not need baby aspirin.     Josph Macho, MD 12/5/20249:17 AM

## 2023-01-02 NOTE — Progress Notes (Signed)
1 unit therapeutic phlebotomy performed over 30 minutes using a 20 g via right AC. Patient tolerated well. Nourishment provided. Replacement fluids administered as ordered.  Patient IV became malpositioned after receiving approximately half the volume of IV fluids. Patient does not wish to have another IV started and receive the remainder of the fluids. Patient drinking fluids in infusion room and verbalized understanding he is to drink fluids at home.

## 2023-01-02 NOTE — Progress Notes (Signed)
Specialty Pharmacy Refill Coordination Note  John Conner is a 62 y.o. male contacted today regarding refills of specialty medication(s) Ruxolitinib Phosphate (Antineoplastic Enzyme Inhibitors)   Patient requested Pickup at [facility] at [place] date: 01/08/23   Medication will be filled on 01/07/23.   Patient will pay AR account and provide CC information at [facility].

## 2023-01-02 NOTE — Progress Notes (Signed)
Specialty Pharmacy Ongoing Clinical Assessment Note  John Conner is a 62 y.o. male who is being followed by the specialty pharmacy service for RxSp Oncology   Patient's specialty medication(s) reviewed today: Ruxolitinib Phosphate (Antineoplastic Enzyme Inhibitors)   Missed doses in the last 4 weeks: 0   Patient/Caregiver did not have any additional questions or concerns.   Therapeutic benefit summary: Unable to assess   Adverse events/side effects summary: No adverse events/side effects   Patient's therapy is appropriate to: Continue    Goals Addressed             This Visit's Progress    Stabilization of disease       Patient is on track. Patient will maintain adherence         Follow up:  3 months  [name] Specialty Pharmacist

## 2023-01-02 NOTE — Patient Instructions (Signed)

## 2023-01-07 ENCOUNTER — Other Ambulatory Visit: Payer: Self-pay | Admitting: Internal Medicine

## 2023-01-07 ENCOUNTER — Other Ambulatory Visit (HOSPITAL_COMMUNITY): Payer: Self-pay | Admitting: Internal Medicine

## 2023-01-14 ENCOUNTER — Other Ambulatory Visit (HOSPITAL_COMMUNITY): Payer: Self-pay

## 2023-01-21 ENCOUNTER — Other Ambulatory Visit: Payer: Self-pay

## 2023-01-25 ENCOUNTER — Other Ambulatory Visit (HOSPITAL_COMMUNITY): Payer: Self-pay | Admitting: Cardiology

## 2023-01-25 ENCOUNTER — Other Ambulatory Visit (HOSPITAL_COMMUNITY): Payer: Self-pay | Admitting: Adult Health

## 2023-01-25 ENCOUNTER — Other Ambulatory Visit: Payer: Self-pay | Admitting: Internal Medicine

## 2023-01-27 ENCOUNTER — Encounter: Payer: Self-pay | Admitting: Student

## 2023-01-27 ENCOUNTER — Ambulatory Visit: Payer: Medicaid Other | Attending: Student | Admitting: Student

## 2023-01-27 VITALS — BP 110/68 | HR 69 | Ht 70.0 in | Wt 324.0 lb

## 2023-01-27 DIAGNOSIS — I428 Other cardiomyopathies: Secondary | ICD-10-CM | POA: Diagnosis not present

## 2023-01-27 DIAGNOSIS — I4819 Other persistent atrial fibrillation: Secondary | ICD-10-CM | POA: Diagnosis not present

## 2023-01-27 DIAGNOSIS — I5022 Chronic systolic (congestive) heart failure: Secondary | ICD-10-CM

## 2023-01-27 DIAGNOSIS — I493 Ventricular premature depolarization: Secondary | ICD-10-CM

## 2023-01-27 DIAGNOSIS — R7989 Other specified abnormal findings of blood chemistry: Secondary | ICD-10-CM

## 2023-01-27 DIAGNOSIS — I10 Essential (primary) hypertension: Secondary | ICD-10-CM

## 2023-01-27 NOTE — Patient Instructions (Signed)
Medication Instructions:  Your physician recommends that you continue on your current medications as directed. Please refer to the Current Medication list given to you today.  *If you need a refill on your cardiac medications before your next appointment, please call your pharmacy*  Lab Work: Your physician recommends that you have lab work today- TSH, T4, and CMET  If you have labs (blood work) drawn today and your tests are completely normal, you will receive your results only by: MyChart Message (if you have MyChart) OR A paper copy in the mail If you have any lab test that is abnormal or we need to change your treatment, we will call you to review the results.  Follow-Up: At [facility], you and your health needs are our priority.  As part of our continuing mission to provide you with exceptional heart care, we have created designated Provider Care Teams.  These Care Teams include your primary Cardiologist (physician) and Advanced Practice Providers (APPs -  Physician Assistants and Nurse Practitioners) who all work together to provide you with the care you need, when you need it.  We recommend signing up for the patient portal called "MyChart".  Sign up information is provided on this After Visit Summary.  MyChart is used to connect with patients for Virtual Visits (Telemedicine).  Patients are able to view lab/test results, encounter notes, upcoming appointments, etc.  Non-urgent messages can be sent to your provider as well.   To learn more about what you can do with MyChart, go to [email or web address].    Your next appointment:   3 month(s)  Provider:   You will see one of the following Advanced Practice Providers on your designated Care Team:   Casimiro Needle "Mardelle Matte" [place], [place]

## 2023-01-27 NOTE — Progress Notes (Signed)
  Electrophysiology Office Note:   Date:  01/27/2023  ID:  John Conner, DOB [birth year], MRN [id number]  Primary Cardiologist: None Electrophysiologist: Sherryl Manges, MD      History of Present Illness:   John Conner is a 62 y.o. male with h/o NICM, PVCs, Chronic systolic CHF, mild MR, Obesity, Polycythemia Vera, and amiodarone related hyperthyroidism seen today for routine electrophysiology followup.   Since last being seen in our clinic the patient reports doing well. He is off tapazole and has 2 days left of prednisone. He states he did not wear a monitor in the fall. None since February. Overall, he denies chest pain, palpitations, dyspnea, PND, orthopnea, nausea, vomiting, dizziness, syncope, edema, weight gain, or early satiety.   Review of systems complete and found to be negative unless listed in HPI.   EP Information / Studies Reviewed:    EKG is ordered today. Personal review as below.  EKG Interpretation Date/Time:  Monday January 27 2023 11:45:47 EST Ventricular Rate:  69 PR Interval:  206 QRS Duration:  98 QT Interval:  408 QTC Calculation: 437 R Axis:   -57  Text Interpretation: Normal sinus rhythm Left anterior fascicular block Inferior infarct (cited on or before 28-Nov-2022) Possible Anterior infarct (cited on or before 28-Nov-2022) Confirmed by Maxine Glenn [phone]) on 01/27/2023 11:46:42 AM    Echo 08/2022 LVEF 40-45%, mild MR  Physical Exam:   VS:  BP 110/68   Pulse 69   Ht 5\' 10"  (1.778 m)   Wt (!) 324 lb (147 kg)   SpO2 95%   BMI 46.49 kg/m    Wt Readings from Last 3 Encounters:  01/27/23 (!) 324 lb (147 kg)  01/02/23 (!) 322 lb (146.1 kg)  12/13/22 (!) 320 lb (145.2 kg)     GEN: Well nourished, well developed in no acute distress NECK: No JVD; No carotid bruits CARDIAC: Regular rate and rhythm, no murmurs, rubs, gallops RESPIRATORY:  Clear to auscultation without rales, wheezing or rhonchi  ABDOMEN: Soft, non-tender,  non-distended EXTREMITIES:  No edema; No deformity   ASSESSMENT AND PLAN:    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  Will assess with labs today and then discuss recommendations with Dr. Graciela Husbands   Atrial fibrillation significantly enlarged LAE-persistent-recurrent NSR on EKG Continue eliquis 5 mg BID for CHA2DS2/VASc of at least 4.   Mitral regurgitation Mild on Echo 08/2022   Morbid Obesity Body mass index is 46.49 kg/m.  Encouraged lifestyle modification    Sleep apnea treated   Polycythemia vera (positive JAK2) Followed by hematology   Follow up with EP APP in 3 months; Sooner pending Dr. Odessa Fleming recommendations regarding monitoring and hyperthyroid treatment.   Signed, Graciella Freer, PA-C

## 2023-01-27 NOTE — Telephone Encounter (Signed)
This is Otilio Saber PA's Pt. Please advise on this RX

## 2023-01-28 LAB — COMPREHENSIVE METABOLIC PANEL
ALT: 52 [IU]/L — ABNORMAL HIGH (ref 0–44)
AST: 34 [IU]/L (ref 0–40)
Albumin: 4.5 g/dL (ref 3.9–4.9)
Alkaline Phosphatase: 61 [IU]/L (ref 44–121)
BUN/Creatinine Ratio: 18 (ref 10–24)
BUN: 26 mg/dL (ref 8–27)
Bilirubin Total: 0.6 mg/dL (ref 0.0–1.2)
CO2: 19 mmol/L — ABNORMAL LOW (ref 20–29)
Calcium: 9.4 mg/dL (ref 8.6–10.2)
Chloride: 105 mmol/L (ref 96–106)
Creatinine, Ser: 1.43 mg/dL — ABNORMAL HIGH (ref 0.76–1.27)
Globulin, Total: 2.2 g/dL (ref 1.5–4.5)
Glucose: 89 mg/dL (ref 70–99)
Potassium: 5 mmol/L (ref 3.5–5.2)
Sodium: 138 mmol/L (ref 134–144)
Total Protein: 6.7 g/dL (ref 6.0–8.5)
eGFR: 55 mL/min/{1.73_m2} — ABNORMAL LOW (ref >59)

## 2023-01-28 LAB — T4, FREE: Free T4: 1.19 ng/dL (ref 0.82–1.77)

## 2023-01-28 LAB — TSH: TSH: 0.944 u[IU]/mL (ref 0.450–4.500)

## 2023-02-06 ENCOUNTER — Other Ambulatory Visit (HOSPITAL_COMMUNITY): Payer: Self-pay | Admitting: Pharmacy Technician

## 2023-02-06 ENCOUNTER — Other Ambulatory Visit (HOSPITAL_COMMUNITY): Payer: Self-pay

## 2023-02-06 NOTE — Progress Notes (Signed)
 Specialty Pharmacy Refill Coordination Note  John Conner is a 63 y.o. male contacted today regarding refills of specialty medication(s) Ruxolitinib  Phosphate (JAKAFI )   Patient requested Delivery   Delivery date: 02/12/23   Verified address: Patient address [address]  [place]   Medication will be filled on 02/11/23.

## 2023-02-11 ENCOUNTER — Other Ambulatory Visit: Payer: Self-pay

## 2023-02-13 ENCOUNTER — Inpatient Hospital Stay: Payer: 59

## 2023-02-13 ENCOUNTER — Encounter: Payer: Self-pay | Admitting: Hematology & Oncology

## 2023-02-13 ENCOUNTER — Other Ambulatory Visit: Payer: Self-pay

## 2023-02-13 ENCOUNTER — Inpatient Hospital Stay: Payer: 59 | Attending: Medical Oncology

## 2023-02-13 ENCOUNTER — Inpatient Hospital Stay (HOSPITAL_BASED_OUTPATIENT_CLINIC_OR_DEPARTMENT_OTHER): Payer: 59 | Admitting: Hematology & Oncology

## 2023-02-13 VITALS — BP 131/79 | HR 69 | Temp 98.8°F | Resp 19 | Ht 70.0 in | Wt 337.0 lb

## 2023-02-13 DIAGNOSIS — Z7901 Long term (current) use of anticoagulants: Secondary | ICD-10-CM | POA: Diagnosis not present

## 2023-02-13 DIAGNOSIS — D751 Secondary polycythemia: Secondary | ICD-10-CM | POA: Diagnosis present

## 2023-02-13 DIAGNOSIS — D45 Polycythemia vera: Secondary | ICD-10-CM

## 2023-02-13 LAB — CMP (CANCER CENTER ONLY)
ALT: 29 U/L (ref 0–44)
AST: 30 U/L (ref 15–41)
Albumin: 4.2 g/dL (ref 3.5–5.0)
Alkaline Phosphatase: 54 U/L (ref 38–126)
Anion gap: 8 (ref 5–15)
BUN: 15 mg/dL (ref 8–23)
CO2: 26 mmol/L (ref 22–32)
Calcium: 8.8 mg/dL — ABNORMAL LOW (ref 8.9–10.3)
Chloride: 108 mmol/L (ref 98–111)
Creatinine: 1.28 mg/dL — ABNORMAL HIGH (ref 0.61–1.24)
GFR, Estimated: >60 mL/min (ref >60)
Glucose, Bld: 87 mg/dL (ref 70–99)
Potassium: 4.1 mmol/L (ref 3.5–5.1)
Sodium: 142 mmol/L (ref 135–145)
Total Bilirubin: 0.7 mg/dL (ref 0.0–1.2)
Total Protein: 6.5 g/dL (ref 6.5–8.1)

## 2023-02-13 LAB — CBC WITH DIFFERENTIAL (CANCER CENTER ONLY)
Abs Immature Granulocytes: 0.02 10*3/uL (ref 0.00–0.07)
Basophils Absolute: 0 10*3/uL (ref 0.0–0.1)
Basophils Relative: 1 %
Eosinophils Absolute: 0.2 10*3/uL (ref 0.0–0.5)
Eosinophils Relative: 3 %
HCT: 43.3 % (ref 39.0–52.0)
Hemoglobin: 12.2 g/dL — ABNORMAL LOW (ref 13.0–17.0)
Immature Granulocytes: 0 %
Lymphocytes Relative: 41 %
Lymphs Abs: 2.7 10*3/uL (ref 0.7–4.0)
MCH: 21.2 pg — ABNORMAL LOW (ref 26.0–34.0)
MCHC: 28.2 g/dL — ABNORMAL LOW (ref 30.0–36.0)
MCV: 75.2 fL — ABNORMAL LOW (ref 80.0–100.0)
Monocytes Absolute: 0.9 10*3/uL (ref 0.1–1.0)
Monocytes Relative: 13 %
Neutro Abs: 2.8 10*3/uL (ref 1.7–7.7)
Neutrophils Relative %: 42 %
Platelet Count: 346 10*3/uL (ref 150–400)
RBC: 5.76 MIL/uL (ref 4.22–5.81)
RDW: 24.9 % — ABNORMAL HIGH (ref 11.5–15.5)
WBC Count: 6.6 10*3/uL (ref 4.0–10.5)
nRBC: 0 % (ref 0.0–0.2)

## 2023-02-13 LAB — IRON AND IRON BINDING CAPACITY (CC-WL,HP ONLY)
Iron: 65 ug/dL (ref 45–182)
Saturation Ratios: 16 % — ABNORMAL LOW (ref 17.9–39.5)
TIBC: 399 ug/dL (ref 250–450)
UIBC: 334 ug/dL (ref 117–376)

## 2023-02-13 LAB — FERRITIN: Ferritin: 18 ng/mL — ABNORMAL LOW (ref 24–336)

## 2023-02-13 LAB — LACTATE DEHYDROGENASE: LDH: 257 U/L — ABNORMAL HIGH (ref 98–192)

## 2023-02-13 NOTE — Progress Notes (Signed)
Hematology and Oncology Follow Up Visit  John Conner [id number] 12/04/1960 63 y.o. 02/13/2023   Principle Diagnosis:  Polycythemia vera  -JAK 2 positive    Current Therapy:   Eliquis 5 mg p.o. twice daily      Phlebotomy to maintain Hct < 45%   Interim History:  John Conner is here today for follow-up.  We last saw him back in December.  He had a very nice Christmas and New Year's.  He is feeling okay.  Really has had no specific complaints.  He has had no problems with bleeding.  He is on Eliquis.  He continues on his Ozempic to try to help lose weight.  He has had no headache.  He has had no cough or shortness of breath.  He has had no nausea or vomiting.  He has had no change in bowel or bladder habits.  There has been no rashes.  His last iron studies that were done back in December showed a ferritin of 14 with an iron saturation of 15%.  Overall, I would say his performance status is probably ECOG 1.   Wt Readings from Last 3 Encounters:  01/27/23 (!) 324 lb (147 kg)  01/02/23 (!) 322 lb (146.1 kg)  12/13/22 (!) 320 lb (145.2 kg)   Medications:  Allergies as of 02/13/2023       Reactions   Diphenhydramine Hcl Swelling   Lips swell   Lisinopril Cough        Medication List        Accurate as of February 13, 2023  8:59 AM. If you have any questions, ask your nurse or doctor.          Accu-Chek Guide test strip Generic drug: glucose blood 1 each by Other route as directed.   Accu-Chek Softclix Lancets lancets 1 each by Other route as directed.   amiodarone 200 MG tablet Commonly known as: PACERONE TAKE 1 TABLET BY MOUTH DAILY 5 DAYS PER WEEK. MONDAY- FRIDAY   apixaban 5 MG Tabs tablet Commonly known as: Eliquis Take 1 tablet (5 mg total) by mouth 2 (two) times daily.   atorvastatin 80 MG tablet Commonly known as: LIPITOR TAKE 1 TABLET BY MOUTH EVERY DAY   carvedilol 12.5 MG tablet Commonly known as: COREG TAKE 1 TABLET (12.5MG  TOTAL) BY MOUTH  TWICE A DAY WITH MEALS   diclofenac Sodium 1 % Gel Commonly known as: VOLTAREN Apply 2 g topically 4 (four) times daily.   empagliflozin 10 MG Tabs tablet Commonly known as: Jardiance Take 1 tablet (10 mg total) by mouth daily.   Entresto 49-51 MG Generic drug: sacubitril-valsartan Take 1 tablet by mouth 2 (two) times daily.   fluticasone 50 MCG/ACT nasal spray Commonly known as: FLONASE Place 1 spray into both nostrils as needed for allergies or rhinitis.   furosemide 40 MG tablet Commonly known as: LASIX TAKE 2 TABLETS BY MOUTH EVERY MORNING AND 1 TABLET EVERY EVENING.   Jakafi 10 MG tablet Generic drug: ruxolitinib phosphate Take 1 tablet (10 mg total) by mouth 2 (two) times daily.   methimazole 10 MG tablet Commonly known as: TAPAZOLE TAKE 3 TABLETS BY MOUTH EVERY DAY   mometasone 0.1 % cream Commonly known as: ELOCON Apply 1 Application topically daily.   potassium chloride SA 20 MEQ tablet Commonly known as: KLOR-CON M Take 1 tablet (20 mEq total) by mouth daily.   predniSONE 20 MG tablet Commonly known as: DELTASONE Take 2 tablets by mouth (40mg ) by mouth  daily x 30 days then 1 tablet (20mg ) by mouth daily x 30 days then 1/2 tablet (10mg ) x 30 days   Semaglutide (2 MG/DOSE) 8 MG/3ML Sopn Inject 2 mg into the skin once a week.   spironolactone 25 MG tablet Commonly known as: ALDACTONE TAKE 0.5 TABLETS (12.5 MG TOTAL) BY MOUTH DAILY. NEEDS FOLLOW UP APPOINTMENT FOR MORE REFILLS   tadalafil 5 MG tablet Commonly known as: CIALIS Take 5 mg by mouth as directed.   Vitamin D3 10 MCG (400 UNIT) tablet Take 400 Units by mouth daily.        Allergies:  Allergies  Allergen Reactions   Diphenhydramine Hcl Swelling    Lips swell     Lisinopril Cough    Past Medical History, Surgical history, Social history, and Family History were reviewed and updated.  Review of Systems: Review of Systems  Constitutional: Negative.   HENT: Negative.    Eyes:  Negative.   Respiratory: Negative.    Cardiovascular: Negative.   Gastrointestinal: Negative.   Genitourinary: Negative.   Musculoskeletal: Negative.   Skin: Negative.   Neurological: Negative.   Endo/Heme/Allergies: Negative.   Psychiatric/Behavioral: Negative.     Marland Kitchen   Physical Exam:  Temperature 98.8.  Pulse 69.  Blood pressure 131/79.  Weight is 337 pounds.   Wt Readings from Last 3 Encounters:  01/27/23 (!) 324 lb (147 kg)  01/02/23 (!) 322 lb (146.1 kg)  12/13/22 (!) 320 lb (145.2 kg)   Physical Exam Vitals reviewed.  HENT:     Head: Normocephalic and atraumatic.  Eyes:     Pupils: Pupils are equal, round, and reactive to light.  Cardiovascular:     Rate and Rhythm: Normal rate and regular rhythm.     Heart sounds: Normal heart sounds.  Pulmonary:     Effort: Pulmonary effort is normal.     Breath sounds: Normal breath sounds.  Abdominal:     General: Bowel sounds are normal.     Palpations: Abdomen is soft.  Musculoskeletal:        General: No tenderness or deformity. Normal range of motion.     Cervical back: Normal range of motion.  Lymphadenopathy:     Cervical: No cervical adenopathy.  Skin:    General: Skin is warm and dry.     Findings: No erythema or rash.  Neurological:     Mental Status: He is alert and oriented to person, place, and time.  Psychiatric:        Behavior: Behavior normal.        Thought Content: Thought content normal.        Judgment: Judgment normal.       Lab Results  Component Value Date   WBC 6.6 02/13/2023   HGB 12.2 (L) 02/13/2023   HCT 43.3 02/13/2023   MCV 75.2 (L) 02/13/2023   PLT 346 02/13/2023   Lab Results  Component Value Date   FERRITIN 14 (L) 01/02/2023   IRON 66 01/02/2023   TIBC 442 01/02/2023   UIBC 376 01/02/2023   IRONPCTSAT 15 (L) 01/02/2023   Lab Results  Component Value Date   RETICCTPCT 1.4 11/12/2021   RBC 5.76 02/13/2023   No results found for: "KPAFRELGTCHN", "LAMBDASER",  "KAPLAMBRATIO" No results found for: "IGGSERUM", "IGA", "IGMSERUM" No results found for: "TOTALPROTELP", "ALBUMINELP", "A1GS", "A2GS", "BETS", "BETA2SER", "GAMS", "MSPIKE", "SPEI"   Chemistry      Component Value Date/Time   NA 138 01/27/2023 1226   K 5.0 01/27/2023 1226  CL 105 01/27/2023 1226   CO2 19 (L) 01/27/2023 1226   BUN 26 01/27/2023 1226   CREATININE 1.43 (H) 01/27/2023 1226   CREATININE 1.35 (H) 01/02/2023 0836   CREATININE 1.11 09/09/2014 1017      Component Value Date/Time   CALCIUM 9.4 01/27/2023 1226   ALKPHOS 61 01/27/2023 1226   AST 34 01/27/2023 1226   AST 26 01/02/2023 0836   ALT 52 (H) 01/27/2023 1226   ALT 44 01/02/2023 0836   BILITOT 0.6 01/27/2023 1226   BILITOT 0.7 01/02/2023 0836      Impression and Plan: Mr. Hearst is a very pleasant 63 yo African American gentleman with polycythemia vera.  He is positive for the JAK 2 mutation.  Currently, he is on no myelosuppressive medication.  I really do not think he would benefit from that right now.  He does not need to be phlebotomized.  We will plan to get him back in 6 more weeks.  If we still see that he is doing well, then we might be able to move his appointments out.    Josph Macho, MD 1/16/20258:59 AM

## 2023-03-04 ENCOUNTER — Other Ambulatory Visit: Payer: Self-pay | Admitting: Medical Oncology

## 2023-03-04 ENCOUNTER — Other Ambulatory Visit (HOSPITAL_COMMUNITY): Payer: Self-pay

## 2023-03-04 ENCOUNTER — Encounter: Payer: Self-pay | Admitting: Family

## 2023-03-04 DIAGNOSIS — Z1589 Genetic susceptibility to other disease: Secondary | ICD-10-CM

## 2023-03-04 DIAGNOSIS — D751 Secondary polycythemia: Secondary | ICD-10-CM

## 2023-03-04 NOTE — Progress Notes (Signed)
 Specialty Pharmacy Ongoing Clinical Assessment Note  John Conner is a 63 y.o. male who is being followed by the specialty pharmacy service for RxSp Oncology   Patient's specialty medication(s) reviewed today: Ruxolitinib  Phosphate (JAKAFI )   Missed doses in the last 4 weeks: 0   Patient/Caregiver did not have any additional questions or concerns.   Therapeutic benefit summary: Patient is achieving benefit   Adverse events/side effects summary: No adverse events/side effects   Patient's therapy is appropriate to: Continue    Goals Addressed             This Visit's Progress    Stabilization of disease   On track    Patient is initiating therapy. Patient will maintain adherence         Follow up:  3 months  Silvano LOISE Dolly Specialty Pharmacist

## 2023-03-04 NOTE — Progress Notes (Signed)
 Specialty Pharmacy Refill Coordination Note  John Conner is a 63 y.o. male contacted today regarding refills of specialty medication(s) Ruxolitinib  Phosphate (JAKAFI )   Patient requested [facility] at [facility] at [place] date: 03/12/23   Medication will be filled on 03/11/23. This fill date is pending response to refill request from provider. Patient is aware and if they have not received fill by intended date they must follow up with pharmacy.

## 2023-03-06 ENCOUNTER — Other Ambulatory Visit (HOSPITAL_COMMUNITY): Payer: Self-pay

## 2023-03-06 ENCOUNTER — Encounter: Payer: Self-pay | Admitting: Family

## 2023-03-06 ENCOUNTER — Other Ambulatory Visit: Payer: Self-pay

## 2023-03-06 MED ORDER — RUXOLITINIB PHOSPHATE 10 MG PO TABS
10.0000 mg | ORAL_TABLET | Freq: Two times a day (BID) | ORAL | 2 refills | Status: DC
  Filled 2023-03-06: qty 60, 30d supply, fill #0

## 2023-03-11 ENCOUNTER — Telehealth: Payer: Self-pay | Admitting: Pharmacy Technician

## 2023-03-11 ENCOUNTER — Encounter: Payer: Self-pay | Admitting: Family

## 2023-03-11 ENCOUNTER — Other Ambulatory Visit (HOSPITAL_COMMUNITY): Payer: Self-pay

## 2023-03-11 NOTE — Telephone Encounter (Signed)
Oral Oncology Patient Advocate Encounter   Received notification that prior authorization for John Conner is required.   PA submitted on 03/11/2023 Key [id number] Status is pending     [name], CPhT Oncology Pharmacy Patient Advocate [facility] Direct Number: [phone] Fax: [phone]

## 2023-03-12 ENCOUNTER — Other Ambulatory Visit (HOSPITAL_COMMUNITY): Payer: Self-pay

## 2023-03-13 ENCOUNTER — Other Ambulatory Visit (HOSPITAL_COMMUNITY): Payer: Self-pay

## 2023-03-14 ENCOUNTER — Other Ambulatory Visit: Payer: Self-pay | Admitting: *Deleted

## 2023-03-14 ENCOUNTER — Other Ambulatory Visit (HOSPITAL_COMMUNITY): Payer: Self-pay

## 2023-03-14 ENCOUNTER — Other Ambulatory Visit: Payer: Self-pay

## 2023-03-14 ENCOUNTER — Other Ambulatory Visit: Payer: Self-pay | Admitting: Pharmacy Technician

## 2023-03-14 DIAGNOSIS — D751 Secondary polycythemia: Secondary | ICD-10-CM

## 2023-03-14 DIAGNOSIS — Z1589 Genetic susceptibility to other disease: Secondary | ICD-10-CM

## 2023-03-14 MED ORDER — RUXOLITINIB PHOSPHATE 10 MG PO TABS: 10.0000 mg | ORAL_TABLET | Freq: Two times a day (BID) | ORAL | 2 refills | Status: DC

## 2023-03-14 NOTE — Progress Notes (Signed)
Oral Oncology Patient Advocate Encounter  New insuranc eplan Jari Favre) requires patient to use [facility]  Jinger Neighbors, CPhT-Adv Oncology Pharmacy Patient Advocate [facility] Direct Number: [phone]  Fax: [phone]

## 2023-03-14 NOTE — Telephone Encounter (Signed)
Oral Oncology Patient Advocate Encounter  Prior Authorization for John Conner has been approved.    PA# [id number] CW Effective dates: 03/13/2023 through 03/12/2024  [facility] must [facility]   [name], CPhT Oncology Pharmacy Patient Advocate [facility] Direct Number: [phone] Fax: [phone]

## 2023-03-27 ENCOUNTER — Inpatient Hospital Stay (HOSPITAL_BASED_OUTPATIENT_CLINIC_OR_DEPARTMENT_OTHER): Payer: 59 | Admitting: Hematology & Oncology

## 2023-03-27 ENCOUNTER — Encounter: Payer: Self-pay | Admitting: Hematology & Oncology

## 2023-03-27 ENCOUNTER — Inpatient Hospital Stay: Payer: 59 | Attending: Hematology & Oncology

## 2023-03-27 ENCOUNTER — Inpatient Hospital Stay: Payer: 59

## 2023-03-27 VITALS — BP 130/77 | HR 76 | Temp 97.8°F | Resp 18 | Ht 70.0 in | Wt 338.5 lb

## 2023-03-27 DIAGNOSIS — I4891 Unspecified atrial fibrillation: Secondary | ICD-10-CM | POA: Insufficient documentation

## 2023-03-27 DIAGNOSIS — D45 Polycythemia vera: Secondary | ICD-10-CM | POA: Diagnosis not present

## 2023-03-27 DIAGNOSIS — Z7901 Long term (current) use of anticoagulants: Secondary | ICD-10-CM | POA: Insufficient documentation

## 2023-03-27 DIAGNOSIS — D751 Secondary polycythemia: Secondary | ICD-10-CM | POA: Insufficient documentation

## 2023-03-27 LAB — CBC WITH DIFFERENTIAL (CANCER CENTER ONLY)
Abs Immature Granulocytes: 0.02 10*3/uL (ref 0.00–0.07)
Basophils Absolute: 0 10*3/uL (ref 0.0–0.1)
Basophils Relative: 0 %
Eosinophils Absolute: 0.1 10*3/uL (ref 0.0–0.5)
Eosinophils Relative: 1 %
HCT: 43.1 % (ref 39.0–52.0)
Hemoglobin: 12.8 g/dL — ABNORMAL LOW (ref 13.0–17.0)
Immature Granulocytes: 0 %
Lymphocytes Relative: 40 %
Lymphs Abs: 2.8 10*3/uL (ref 0.7–4.0)
MCH: 22.9 pg — ABNORMAL LOW (ref 26.0–34.0)
MCHC: 29.7 g/dL — ABNORMAL LOW (ref 30.0–36.0)
MCV: 77.2 fL — ABNORMAL LOW (ref 80.0–100.0)
Monocytes Absolute: 0.6 10*3/uL (ref 0.1–1.0)
Monocytes Relative: 9 %
Neutro Abs: 3.4 10*3/uL (ref 1.7–7.7)
Neutrophils Relative %: 50 %
Platelet Count: 409 10*3/uL — ABNORMAL HIGH (ref 150–400)
RBC: 5.58 MIL/uL (ref 4.22–5.81)
RDW: 21.8 % — ABNORMAL HIGH (ref 11.5–15.5)
WBC Count: 6.9 10*3/uL (ref 4.0–10.5)
nRBC: 0 % (ref 0.0–0.2)

## 2023-03-27 LAB — CMP (CANCER CENTER ONLY)
ALT: 42 U/L (ref 0–44)
AST: 34 U/L (ref 15–41)
Albumin: 4.4 g/dL (ref 3.5–5.0)
Alkaline Phosphatase: 58 U/L (ref 38–126)
Anion gap: 8 (ref 5–15)
BUN: 15 mg/dL (ref 8–23)
CO2: 24 mmol/L (ref 22–32)
Calcium: 9.3 mg/dL (ref 8.9–10.3)
Chloride: 107 mmol/L (ref 98–111)
Creatinine: 1.39 mg/dL — ABNORMAL HIGH (ref 0.61–1.24)
GFR, Estimated: 57 mL/min — ABNORMAL LOW (ref >60)
Glucose, Bld: 93 mg/dL (ref 70–99)
Potassium: 4.2 mmol/L (ref 3.5–5.1)
Sodium: 139 mmol/L (ref 135–145)
Total Bilirubin: 0.8 mg/dL (ref 0.0–1.2)
Total Protein: 6.5 g/dL (ref 6.5–8.1)

## 2023-03-27 LAB — IRON AND IRON BINDING CAPACITY (CC-WL,HP ONLY)
Iron: 64 ug/dL (ref 45–182)
Saturation Ratios: 15 % — ABNORMAL LOW (ref 17.9–39.5)
TIBC: 414 ug/dL (ref 250–450)
UIBC: 350 ug/dL (ref 117–376)

## 2023-03-27 LAB — FERRITIN: Ferritin: 13 ng/mL — ABNORMAL LOW (ref 24–336)

## 2023-03-27 LAB — RETICULOCYTES
Immature Retic Fract: 30.2 % — ABNORMAL HIGH (ref 2.3–15.9)
RBC.: 5.5 MIL/uL (ref 4.22–5.81)
Retic Count, Absolute: 61.1 10*3/uL (ref 19.0–186.0)
Retic Ct Pct: 1.1 % (ref 0.4–3.1)

## 2023-03-27 NOTE — Progress Notes (Signed)
 Hematology and Oncology Follow Up Visit  John Conner [id number] 01-07-1961 63 y.o. 03/27/2023   Principle Diagnosis:  Polycythemia vera  -JAK 2 positive    Current Therapy:   Eliquis 5 mg p.o. twice daily      Phlebotomy to maintain Hct < 45% Jakafi 10 mg po BID   Interim History:  John Conner is here today for follow-up.  We saw about 6 weeks ago.  He is doing okay.  He is doing well with the Jakafi.  He has had no problems with nausea or vomiting.  He continues on medication for his atrial fibrillation.  He has had no problems with chest pain.  There is no cough or shortness of breath.  He has had no change in bowel or bladder habits.  He continues on Ozempic.  He has had a little bit of leg swelling.  He has had no rashes.  He has had no fever.  He has had no headache.  When we last saw him, his ferritin was 18  with an iron saturation of 60%.  Overall, I would say that his performance status is ECOG 1.   Wt Readings from Last 3 Encounters:  03/27/23 (!) 338 lb 8 oz (153.5 kg)  02/13/23 (!) 337 lb (152.9 kg)  01/27/23 (!) 324 lb (147 kg)   Medications:  Allergies as of 03/27/2023       Reactions   Diphenhydramine Hcl Swelling   Lips swell   Lisinopril Cough        Medication List        Accurate as of March 27, 2023  9:25 AM. If you have any questions, ask your nurse or doctor.          Accu-Chek Guide test strip Generic drug: glucose blood 1 each by Other route as directed.   Accu-Chek Softclix Lancets lancets 1 each by Other route as directed.   amiodarone 200 MG tablet Commonly known as: PACERONE TAKE 1 TABLET BY MOUTH DAILY 5 DAYS PER WEEK. MONDAY- FRIDAY   apixaban 5 MG Tabs tablet Commonly known as: Eliquis Take 1 tablet (5 mg total) by mouth 2 (two) times daily.   atorvastatin 80 MG tablet Commonly known as: LIPITOR TAKE 1 TABLET BY MOUTH EVERY DAY   carvedilol 12.5 MG tablet Commonly known as: COREG TAKE 1 TABLET (12.5MG  TOTAL)  BY MOUTH TWICE A DAY WITH MEALS   diclofenac Sodium 1 % Gel Commonly known as: VOLTAREN Apply 2 g topically 4 (four) times daily.   empagliflozin 10 MG Tabs tablet Commonly known as: Jardiance Take 1 tablet (10 mg total) by mouth daily.   Entresto 49-51 MG Generic drug: sacubitril-valsartan Take 1 tablet by mouth 2 (two) times daily.   fluticasone 50 MCG/ACT nasal spray Commonly known as: FLONASE Place 1 spray into both nostrils as needed for allergies or rhinitis.   furosemide 40 MG tablet Commonly known as: LASIX TAKE 2 TABLETS BY MOUTH EVERY MORNING AND 1 TABLET EVERY EVENING.   methimazole 10 MG tablet Commonly known as: TAPAZOLE TAKE 3 TABLETS BY MOUTH EVERY DAY   mometasone 0.1 % cream Commonly known as: ELOCON Apply 1 Application topically daily.   potassium chloride SA 20 MEQ tablet Commonly known as: KLOR-CON M Take 1 tablet (20 mEq total) by mouth daily.   predniSONE 20 MG tablet Commonly known as: DELTASONE Take 2 tablets by mouth (40mg ) by mouth daily x 30 days then 1 tablet (20mg ) by mouth daily x 30 days then  1/2 tablet (10mg ) x 30 days   ruxolitinib phosphate 10 MG tablet Commonly known as: JAKAFI Take 1 tablet (10 mg total) by mouth 2 (two) times daily.   Semaglutide (2 MG/DOSE) 8 MG/3ML Sopn Inject 2 mg into the skin once a week.   spironolactone 25 MG tablet Commonly known as: ALDACTONE TAKE 0.5 TABLETS (12.5 MG TOTAL) BY MOUTH DAILY. NEEDS FOLLOW UP APPOINTMENT FOR MORE REFILLS   tadalafil 5 MG tablet Commonly known as: CIALIS Take 5 mg by mouth as directed.   Vitamin D3 10 MCG (400 UNIT) tablet Take 400 Units by mouth daily.        Allergies:  Allergies  Allergen Reactions   Diphenhydramine Hcl Swelling    Lips swell     Lisinopril Cough    Past Medical History, Surgical history, Social history, and Family History were reviewed and updated.  Review of Systems: Review of Systems  Constitutional: Negative.   HENT: Negative.     Eyes: Negative.   Respiratory: Negative.    Cardiovascular: Negative.   Gastrointestinal: Negative.   Genitourinary: Negative.   Musculoskeletal: Negative.   Skin: Negative.   Neurological: Negative.   Endo/Heme/Allergies: Negative.   Psychiatric/Behavioral: Negative.     Marland Kitchen   Physical Exam:  Temperature 97.8.  Pulse 76.  Blood pressure 130/77.  Weight is 338 pounds.     Wt Readings from Last 3 Encounters:  03/27/23 (!) 338 lb 8 oz (153.5 kg)  02/13/23 (!) 337 lb (152.9 kg)  01/27/23 (!) 324 lb (147 kg)   Physical Exam Vitals reviewed.  HENT:     Head: Normocephalic and atraumatic.  Eyes:     Pupils: Pupils are equal, round, and reactive to light.  Cardiovascular:     Rate and Rhythm: Normal rate and regular rhythm.     Heart sounds: Normal heart sounds.  Pulmonary:     Effort: Pulmonary effort is normal.     Breath sounds: Normal breath sounds.  Abdominal:     General: Bowel sounds are normal.     Palpations: Abdomen is soft.  Musculoskeletal:        General: No tenderness or deformity. Normal range of motion.     Cervical back: Normal range of motion.  Lymphadenopathy:     Cervical: No cervical adenopathy.  Skin:    General: Skin is warm and dry.     Findings: No erythema or rash.  Neurological:     Mental Status: He is alert and oriented to person, place, and time.  Psychiatric:        Behavior: Behavior normal.        Thought Content: Thought content normal.        Judgment: Judgment normal.       Lab Results  Component Value Date   WBC 6.9 03/27/2023   HGB 12.8 (L) 03/27/2023   HCT 43.1 03/27/2023   MCV 77.2 (L) 03/27/2023   PLT 409 (H) 03/27/2023   Lab Results  Component Value Date   FERRITIN 18 (L) 02/13/2023   IRON 65 02/13/2023   TIBC 399 02/13/2023   UIBC 334 02/13/2023   IRONPCTSAT 16 (L) 02/13/2023   Lab Results  Component Value Date   RETICCTPCT 1.1 03/27/2023   RBC 5.50 03/27/2023   No results found for: "KPAFRELGTCHN",  "LAMBDASER", "KAPLAMBRATIO" No results found for: "IGGSERUM", "IGA", "IGMSERUM" No results found for: "TOTALPROTELP", "ALBUMINELP", "A1GS", "A2GS", "BETS", "BETA2SER", "GAMS", "MSPIKE", "SPEI"   Chemistry      Component Value  Date/Time   NA 139 03/27/2023 0840   NA 138 01/27/2023 1226   K 4.2 03/27/2023 0840   CL 107 03/27/2023 0840   CO2 24 03/27/2023 0840   BUN 15 03/27/2023 0840   BUN 26 01/27/2023 1226   CREATININE 1.39 (H) 03/27/2023 0840   CREATININE 1.11 09/09/2014 1017      Component Value Date/Time   CALCIUM 9.3 03/27/2023 0840   ALKPHOS 58 03/27/2023 0840   AST 34 03/27/2023 0840   ALT 42 03/27/2023 0840   BILITOT 0.8 03/27/2023 0840      Impression and Plan: Mr. Prehn is a very pleasant 63 yo African American gentleman with polycythemia vera.  He is positive for the JAK 2 mutation.  Currently, he is on Jakafi.  He is doing well with the Jakafi.  He does not need to be phlebotomized.  I think we get him back in 2 months now.  I think as long as his blood counts are holding steady, we can move his appointments out a little bit longer each time.  I am just happy that he is doing okay with a decent quality of life.    Josph Macho, MD 2/27/20259:25 AM

## 2023-03-31 NOTE — Progress Notes (Deleted)
  Electrophysiology Office Note:   Date:  03/31/2023  ID:  Thadeus Gandolfi, DOB 1960-11-09, MRN [id number]  Primary Cardiologist: None Electrophysiologist: Sherryl Manges, MD  {Click to update primary MD,subspecialty MD or APP then REFRESH:1}    History of Present Illness:   Javiel Canepa is a 63 y.o. male with h/o NICM, PVCs, Chronic systolic CHF, mild MR, Obesity, Polycythemia Vera, and amiodarone related hyperthyroidism  seen today for {VISITTYPE:28148}  Review of systems complete and found to be negative unless listed in HPI.   EP Information / Studies Reviewed:    {EKGtoday:28818}       Arrhythmia/Device History No specialty comments available.   Physical Exam:   VS:  There were no vitals taken for this visit.   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    Atrial fibrillation significantly enlarged LAE-persistent-recurrent NSR on EKG today.  Continue eliquis 5 mg BID for CHA2DS2/VASc of at least 4.   Mitral regurgitation Mild on Echo 08/2022   Morbid Obesity There is no height or weight on file to  calculate BMI.  Encouraged lifestyle modification    Sleep apnea treated   Polycythemia vera (positive JAK2) Followed by hematology   {Click here to Review PMH, Prob List, Meds, Allergies, SHx, FHx  :1}   Follow up with {WUJWJ:19147} {EPFOLLOW WG:95621}  Signed, Graciella Freer, PA-C

## 2023-04-01 ENCOUNTER — Ambulatory Visit: Payer: 59 | Admitting: Student

## 2023-04-01 DIAGNOSIS — I5022 Chronic systolic (congestive) heart failure: Secondary | ICD-10-CM

## 2023-04-01 DIAGNOSIS — I428 Other cardiomyopathies: Secondary | ICD-10-CM

## 2023-04-01 DIAGNOSIS — I493 Ventricular premature depolarization: Secondary | ICD-10-CM

## 2023-04-01 DIAGNOSIS — R7989 Other specified abnormal findings of blood chemistry: Secondary | ICD-10-CM

## 2023-04-01 DIAGNOSIS — I4819 Other persistent atrial fibrillation: Secondary | ICD-10-CM

## 2023-04-01 DIAGNOSIS — I10 Essential (primary) hypertension: Secondary | ICD-10-CM

## 2023-04-23 ENCOUNTER — Ambulatory Visit: Admitting: Student

## 2023-05-10 ENCOUNTER — Other Ambulatory Visit (HOSPITAL_COMMUNITY): Payer: Self-pay | Admitting: Internal Medicine

## 2023-05-13 NOTE — Telephone Encounter (Signed)
 Prescription refill request for Eliquis received. Indication:afib Last office visit:12/24 Scr:1.39  2/25 Age: 63 Weight:153.5  kg  Prescription refilled

## 2023-05-15 NOTE — Progress Notes (Signed)
  Electrophysiology Office Note:   Date:  05/16/2023  ID:  Benjamin Brands, DOB 1960/05/28, MRN [id number]  Primary Cardiologist: None Electrophysiologist: Richardo Chandler, MD      History of Present Illness:   John Conner is a 63 y.o. male with h/o NICM, PVCs, Chronic systolic CHF, mild MR, Obesity, Polycythemia Vera, and amiodarone  related hyperthyroidism seen today for routine electrophysiology followup.   Since last being seen in our clinic the patient reports doing OK. He has some MSK pain along his left side where he previously pulled a muscle. Denies exertional chest pain. Occurs randomly and at rest or in certain positions. He does have intermittent lightheadedness with no clear aggravating or relieving factor. Chronic, stable dyspnea with more than moderate exertion. Otherwise, he denies PND, orthopnea, nausea, vomiting, dizziness, syncope, edema, weight gain, or early satiety.   Review of systems complete and found to be negative unless listed in HPI.   EP Information / Studies Reviewed:    EKG is ordered today. Personal review as below.  EKG Interpretation Date/Time:  Friday May 16 2023 09:24:09 EDT Ventricular Rate:  64 PR Interval:  234 QRS Duration:  108 QT Interval:  426 QTC Calculation: 439 R Axis:   -59  Text Interpretation: Sinus rhythm with 1st degree A-V block with occasional Premature ventricular complexes Low voltage QRS Left anterior fascicular block Cannot rule out Anterior infarct (cited on or before 28-Nov-2022) When compared with ECG of 27-Jan-2023 11:45, Premature ventricular complexes are now Present Confirmed by Pilar Bridge [phone]) on 05/16/2023 9:33:45 AM    Arrhythmia/Device History No specialty comments available.   Physical Exam:   VS:  BP 108/82   Pulse 64   Ht 5\' 10"  (1.778 m)   Wt (!) 349 lb (158.3 kg)   SpO2 93%   BMI 50.08 kg/m    Wt Readings from Last 3 Encounters:  05/16/23 (!) 349 lb (158.3 kg)  03/27/23 (!) 338 lb 8 oz (153.5 kg)   02/13/23 (!) 337 lb (152.9 kg)     GEN: No acute distress NECK: No JVD; No carotid bruits CARDIAC: Regular rate and rhythm with occasional ectopy, no murmurs, rubs, gallops RESPIRATORY:  Clear to auscultation without rales, wheezing or rhonchi  ABDOMEN: Soft, non-tender, non-distended EXTREMITIES:  No edema; No deformity   ASSESSMENT AND PLAN:    NICM with + LGE ? Myocarditis HFmrEF Echo 08/2022 LVEF 40-45%, grade 1 DD Continue  entresto , spiro, and jardiance  per HF team Stop Coreg  with lightheadedness  Start Toprol  50 mg BID for less BP effect.  Normal coronaries on prior cath 2023.  PVCs Well controlled on amiodarone  200 mg M-F  Hyperthyroidism ? Amio related Resolved with treatment on methimazole .  Surveillance labs today.   Obesity OSA  Body mass index is 50.08 kg/m.  Encouraged lifestyle modification   Polycythemia vera (positive JAK2) Follows with hematology   Follow up with EP APP in 6 months  Signed, Tylene Galla, PA-C

## 2023-05-16 ENCOUNTER — Ambulatory Visit: Attending: Student | Admitting: Student

## 2023-05-16 ENCOUNTER — Encounter: Payer: Self-pay | Admitting: Student

## 2023-05-16 VITALS — BP 108/82 | HR 64 | Ht 70.0 in | Wt 349.0 lb

## 2023-05-16 DIAGNOSIS — Z79899 Other long term (current) drug therapy: Secondary | ICD-10-CM

## 2023-05-16 DIAGNOSIS — I428 Other cardiomyopathies: Secondary | ICD-10-CM

## 2023-05-16 DIAGNOSIS — I10 Essential (primary) hypertension: Secondary | ICD-10-CM

## 2023-05-16 DIAGNOSIS — I5022 Chronic systolic (congestive) heart failure: Secondary | ICD-10-CM

## 2023-05-16 DIAGNOSIS — I4819 Other persistent atrial fibrillation: Secondary | ICD-10-CM | POA: Diagnosis not present

## 2023-05-16 DIAGNOSIS — I493 Ventricular premature depolarization: Secondary | ICD-10-CM

## 2023-05-16 DIAGNOSIS — R7989 Other specified abnormal findings of blood chemistry: Secondary | ICD-10-CM

## 2023-05-16 DIAGNOSIS — Z9189 Other specified personal risk factors, not elsewhere classified: Secondary | ICD-10-CM

## 2023-05-16 LAB — COMPREHENSIVE METABOLIC PANEL WITH GFR
ALT: 59 IU/L — ABNORMAL HIGH (ref 0–44)
AST: 45 IU/L — ABNORMAL HIGH (ref 0–40)
Albumin: 4.2 g/dL (ref 3.9–4.9)
Alkaline Phosphatase: 73 IU/L (ref 44–121)
BUN/Creatinine Ratio: 10 (ref 10–24)
BUN: 14 mg/dL (ref 8–27)
Bilirubin Total: 0.7 mg/dL (ref 0.0–1.2)
CO2: 22 mmol/L (ref 20–29)
Calcium: 9 mg/dL (ref 8.6–10.2)
Chloride: 106 mmol/L (ref 96–106)
Creatinine, Ser: 1.37 mg/dL — ABNORMAL HIGH (ref 0.76–1.27)
Globulin, Total: 2.6 g/dL (ref 1.5–4.5)
Glucose: 91 mg/dL (ref 70–99)
Potassium: 4.3 mmol/L (ref 3.5–5.2)
Sodium: 141 mmol/L (ref 134–144)
Total Protein: 6.8 g/dL (ref 6.0–8.5)
eGFR: 58 mL/min/{1.73_m2} — ABNORMAL LOW (ref >59)

## 2023-05-16 LAB — T4, FREE: Free T4: 1.21 ng/dL (ref 0.82–1.77)

## 2023-05-16 LAB — TSH: TSH: 0.4 u[IU]/mL — ABNORMAL LOW (ref 0.450–4.500)

## 2023-05-16 MED ORDER — METOPROLOL SUCCINATE ER 50 MG PO TB24: 50.0000 mg | ORAL_TABLET | Freq: Two times a day (BID) | ORAL | 3 refills | Status: AC

## 2023-05-16 NOTE — Patient Instructions (Signed)
 Medication Instructions:  1.Stop coreg  2.Start metoprolol  succinate (Toprol  XL) 50 mg twice daily *If you need a refill on your cardiac medications before your next appointment, please call your pharmacy*  Lab Work: CMET, TSH, FreeT4-TODAY If you have labs (blood work) drawn today and your tests are completely normal, you will receive your results only by: MyChart Message (if you have MyChart) OR A paper copy in the mail If you have any lab test that is abnormal or we need to change your treatment, we will call you to review the results.  Follow-Up: At [facility], you and your health needs are our priority.  As part of our continuing mission to provide you with exceptional heart care, our providers are all part of one team.  This team includes your primary Cardiologist (physician) and Advanced Practice Providers or APPs (Physician Assistants and Nurse Practitioners) who all work together to provide you with the care you need, when you need it.  Your next appointment:   6 month(s)  Provider:   You may see Richardo Chandler, MD or one of the following Advanced Practice Providers on your designated Care Team:   Mertha Abrahams, [place] Bambi Lever "Jonelle Neri" [place], [place] Creighton Doffing, NP  We recommend signing up for the patient portal called "MyChart".  Sign up information is provided on this After Visit Summary.  MyChart is used to connect with patients for Virtual Visits (Telemedicine).  Patients are able to view lab/test results, encounter notes, upcoming appointments, etc.  Non-urgent messages can be sent to your provider as well.   To learn more about what you can do with MyChart, go to [email or web address].     1st Floor: - Lobby - Registration  - Pharmacy  - Lab - Cafe  2nd Floor: - PV Lab - Diagnostic Testing (echo, CT, nuclear med)  3rd Floor: - Vacant  4th Floor: - TCTS (cardiothoracic surgery) - AFib Clinic - Structural Heart Clinic - Vascular Surgery  - Vascular  Ultrasound  5th Floor: - [facility] (general and EP) - Clinical Pharmacy for coumadin, hypertension, lipid, weight-loss medications, and med management appointments    Valet parking services will be available as well.

## 2023-05-22 ENCOUNTER — Telehealth: Payer: Self-pay

## 2023-05-22 NOTE — Telephone Encounter (Signed)
 Spoke with pt and advised of lab result and recommendation per Dr Rodolfo Clan.  Pt verbalizes understanding and thanked Charity fundraiser for the call.

## 2023-05-22 NOTE — Telephone Encounter (Signed)
-----   Message from John Conner sent at 05/17/2023  4:54 PM EDT -----  Please inform patient that  TSH is nearly normal  He should folllowup with his PCP

## 2023-05-26 ENCOUNTER — Other Ambulatory Visit (HOSPITAL_COMMUNITY): Payer: Self-pay | Admitting: Internal Medicine

## 2023-05-26 ENCOUNTER — Encounter: Payer: Self-pay | Admitting: Hematology & Oncology

## 2023-05-26 ENCOUNTER — Inpatient Hospital Stay: Payer: 59 | Attending: Hematology & Oncology

## 2023-05-26 ENCOUNTER — Inpatient Hospital Stay (HOSPITAL_BASED_OUTPATIENT_CLINIC_OR_DEPARTMENT_OTHER): Payer: 59 | Admitting: Hematology & Oncology

## 2023-05-26 ENCOUNTER — Inpatient Hospital Stay: Payer: 59

## 2023-05-26 VITALS — BP 98/70 | HR 61 | Resp 17

## 2023-05-26 VITALS — BP 119/72 | HR 71 | Temp 98.0°F | Resp 24 | Ht 70.0 in | Wt 349.1 lb

## 2023-05-26 DIAGNOSIS — Z7985 Long-term (current) use of injectable non-insulin antidiabetic drugs: Secondary | ICD-10-CM | POA: Diagnosis not present

## 2023-05-26 DIAGNOSIS — I4891 Unspecified atrial fibrillation: Secondary | ICD-10-CM | POA: Diagnosis not present

## 2023-05-26 DIAGNOSIS — D45 Polycythemia vera: Secondary | ICD-10-CM

## 2023-05-26 DIAGNOSIS — D751 Secondary polycythemia: Secondary | ICD-10-CM

## 2023-05-26 DIAGNOSIS — M7989 Other specified soft tissue disorders: Secondary | ICD-10-CM | POA: Insufficient documentation

## 2023-05-26 DIAGNOSIS — Z7901 Long term (current) use of anticoagulants: Secondary | ICD-10-CM | POA: Diagnosis not present

## 2023-05-26 LAB — IRON AND IRON BINDING CAPACITY (CC-WL,HP ONLY)
Iron: 88 ug/dL (ref 45–182)
Saturation Ratios: 22 % (ref 17.9–39.5)
TIBC: 406 ug/dL (ref 250–450)
UIBC: 318 ug/dL (ref 117–376)

## 2023-05-26 LAB — CBC WITH DIFFERENTIAL (CANCER CENTER ONLY)
Abs Immature Granulocytes: 0.02 10*3/uL (ref 0.00–0.07)
Basophils Absolute: 0.1 10*3/uL (ref 0.0–0.1)
Basophils Relative: 1 %
Eosinophils Absolute: 0.1 10*3/uL (ref 0.0–0.5)
Eosinophils Relative: 2 %
HCT: 46 % (ref 39.0–52.0)
Hemoglobin: 13.9 g/dL (ref 13.0–17.0)
Immature Granulocytes: 0 %
Lymphocytes Relative: 40 %
Lymphs Abs: 3.2 10*3/uL (ref 0.7–4.0)
MCH: 24.6 pg — ABNORMAL LOW (ref 26.0–34.0)
MCHC: 30.2 g/dL (ref 30.0–36.0)
MCV: 81.3 fL (ref 80.0–100.0)
Monocytes Absolute: 0.8 10*3/uL (ref 0.1–1.0)
Monocytes Relative: 10 %
Neutro Abs: 3.9 10*3/uL (ref 1.7–7.7)
Neutrophils Relative %: 47 %
Platelet Count: 383 10*3/uL (ref 150–400)
RBC: 5.66 MIL/uL (ref 4.22–5.81)
RDW: 19.8 % — ABNORMAL HIGH (ref 11.5–15.5)
WBC Count: 8.1 10*3/uL (ref 4.0–10.5)
nRBC: 0 % (ref 0.0–0.2)

## 2023-05-26 LAB — CMP (CANCER CENTER ONLY)
ALT: 61 U/L — ABNORMAL HIGH (ref 0–44)
AST: 54 U/L — ABNORMAL HIGH (ref 15–41)
Albumin: 4.4 g/dL (ref 3.5–5.0)
Alkaline Phosphatase: 82 U/L (ref 38–126)
Anion gap: 14 (ref 5–15)
BUN: 22 mg/dL (ref 8–23)
CO2: 21 mmol/L — ABNORMAL LOW (ref 22–32)
Calcium: 9.2 mg/dL (ref 8.9–10.3)
Chloride: 105 mmol/L (ref 98–111)
Creatinine: 1.38 mg/dL — ABNORMAL HIGH (ref 0.61–1.24)
GFR, Estimated: 58 mL/min — ABNORMAL LOW (ref >60)
Glucose, Bld: 129 mg/dL — ABNORMAL HIGH (ref 70–99)
Potassium: 4.4 mmol/L (ref 3.5–5.1)
Sodium: 139 mmol/L (ref 135–145)
Total Bilirubin: 0.7 mg/dL (ref 0.0–1.2)
Total Protein: 7.1 g/dL (ref 6.5–8.1)

## 2023-05-26 LAB — FERRITIN: Ferritin: 21 ng/mL — ABNORMAL LOW (ref 24–336)

## 2023-05-26 LAB — LACTATE DEHYDROGENASE: LDH: 259 U/L — ABNORMAL HIGH (ref 98–192)

## 2023-05-26 MED ORDER — SODIUM CHLORIDE 0.9 % IV SOLN: Freq: Once | INTRAVENOUS | Status: AC

## 2023-05-26 NOTE — Progress Notes (Signed)
 John Conner presents today for phlebotomy per MD orders. Phlebotomy procedure started at 0942 and ended at 1011. 455 grams removed via 20 gauge needle to right AC. Patient observed for 30 minutes after procedure without any incident while receiving post procedure fluids. Patient tolerated procedure well. IV needle removed intact.

## 2023-05-26 NOTE — Progress Notes (Signed)
 Hematology and Oncology Follow Up Visit  John Conner [id number] [birth year] 63 y.o. 05/26/2023   Principle Diagnosis:  Polycythemia vera  -JAK 2 positive    Current Therapy:   Eliquis  5 mg p.o. twice daily      Phlebotomy to maintain Hct < 45% Jakafi  10 mg po BID   Interim History:  John Conner is here today for follow-up.  Overall, he is doing okay.  We last saw him back in February.  Since then, he has really had no complaints.  He still continues on Ozempic to try to lose weight.  He is on Jakafi .  This really has helped his platelet count.  He has had no fever.  He has had no diarrhea.  There has been no rashes.  He still has little bit of leg swelling.  His last iron studies showed a ferritin of 13 with an iron saturation of 15%.  He does have the atrial fibrillation.  He is on Eliquis .  There has been no problems with headache.  He has had no cough.  There is no increased shortness of breath.  Overall, I would say his performance status is probably ECOG 1.   Wt Readings from Last 3 Encounters:  05/26/23 (!) 349 lb 1.9 oz (158.4 kg)  05/16/23 (!) 349 lb (158.3 kg)  03/27/23 (!) 338 lb 8 oz (153.5 kg)   Medications:  Allergies as of 05/26/2023       Reactions   Diphenhydramine Hcl Swelling   Lips swell   Lisinopril  Cough        Medication List        Accurate as of May 26, 2023  9:16 AM. If you have any questions, ask your nurse or doctor.          STOP taking these medications    predniSONE  20 MG tablet Commonly known as: DELTASONE  Stopped by: Ivor Mars       TAKE these medications    Accu-Chek Guide test strip Generic drug: glucose blood 1 each by Other route as directed.   Accu-Chek Softclix Lancets lancets 1 each by Other route as directed.   amiodarone  200 MG tablet Commonly known as: PACERONE  TAKE 1 TABLET BY MOUTH DAILY 5 DAYS PER WEEK. MONDAY- FRIDAY   atorvastatin  80 MG tablet Commonly known as: LIPITOR TAKE 1 TABLET  BY MOUTH EVERY DAY   diclofenac  Sodium 1 % Gel Commonly known as: VOLTAREN  Apply 2 g topically 4 (four) times daily.   Eliquis  5 MG Tabs tablet Generic drug: apixaban  TAKE 1 TABLET BY MOUTH TWICE A DAY   empagliflozin  10 MG Tabs tablet Commonly known as: Jardiance  Take 1 tablet (10 mg total) by mouth daily.   Entresto  49-51 MG Generic drug: sacubitril -valsartan  Take 1 tablet by mouth 2 (two) times daily.   fluticasone  50 MCG/ACT nasal spray Commonly known as: FLONASE  Place 1 spray into both nostrils as needed for allergies or rhinitis.   furosemide  40 MG tablet Commonly known as: LASIX  TAKE 2 TABLETS BY MOUTH EVERY MORNING AND 1 TABLET EVERY EVENING.   methimazole  10 MG tablet Commonly known as: TAPAZOLE  TAKE 3 TABLETS BY MOUTH EVERY DAY   metoprolol  succinate 50 MG 24 hr tablet Commonly known as: TOPROL -XL Take 1 tablet (50 mg total) by mouth 2 (two) times daily. Take with or immediately following a meal.   mometasone  0.1 % cream Commonly known as: ELOCON  Apply 1 Application topically daily.   potassium chloride  SA 20 MEQ tablet Commonly known  as: KLOR-CON  M Take 1 tablet (20 mEq total) by mouth daily.   ruxolitinib  phosphate 10 MG tablet Commonly known as: JAKAFI  Take 1 tablet (10 mg total) by mouth 2 (two) times daily.   Semaglutide (2 MG/DOSE) 8 MG/3ML Sopn Inject 2 mg into the skin once a week.   spironolactone  25 MG tablet Commonly known as: ALDACTONE  TAKE 0.5 TABLETS (12.5 MG TOTAL) BY MOUTH DAILY. NEEDS FOLLOW UP APPOINTMENT FOR MORE REFILLS   tadalafil 5 MG tablet Commonly known as: CIALIS Take 5 mg by mouth as directed.   Vitamin D3 10 MCG (400 UNIT) tablet Take 400 Units by mouth daily.        Allergies:  Allergies  Allergen Reactions   Diphenhydramine Hcl Swelling    Lips swell     Lisinopril  Cough    Past Medical History, Surgical history, Social history, and Family History were reviewed and updated.  Review of Systems: Review  of Systems  Constitutional: Negative.   HENT: Negative.    Eyes: Negative.   Respiratory: Negative.    Cardiovascular: Negative.   Gastrointestinal: Negative.   Genitourinary: Negative.   Musculoskeletal: Negative.   Skin: Negative.   Neurological: Negative.   Endo/Heme/Allergies: Negative.   Psychiatric/Behavioral: Negative.     John Conner   Physical Exam:  Temperature 98.  Pulse 71.  Blood pressure 118/72.  Weight is 349 pounds.      Wt Readings from Last 3 Encounters:  05/26/23 (!) 349 lb 1.9 oz (158.4 kg)  05/16/23 (!) 349 lb (158.3 kg)  03/27/23 (!) 338 lb 8 oz (153.5 kg)   Physical Exam Vitals reviewed.  HENT:     Head: Normocephalic and atraumatic.  Eyes:     Pupils: Pupils are equal, round, and reactive to light.  Cardiovascular:     Rate and Rhythm: Normal rate and regular rhythm.     Heart sounds: Normal heart sounds.  Pulmonary:     Effort: Pulmonary effort is normal.     Breath sounds: Normal breath sounds.  Abdominal:     General: Bowel sounds are normal.     Palpations: Abdomen is soft.  Musculoskeletal:        General: No tenderness or deformity. Normal range of motion.     Cervical back: Normal range of motion.  Lymphadenopathy:     Cervical: No cervical adenopathy.  Skin:    General: Skin is warm and dry.     Findings: No erythema or rash.  Neurological:     Mental Status: He is alert and oriented to person, place, and time.  Psychiatric:        Behavior: Behavior normal.        Thought Content: Thought content normal.        Judgment: Judgment normal.     Lab Results  Component Value Date   WBC 8.1 05/26/2023   HGB 13.9 05/26/2023   HCT 46.0 05/26/2023   MCV 81.3 05/26/2023   PLT 383 05/26/2023   Lab Results  Component Value Date   FERRITIN 13 (L) 03/27/2023   IRON 64 03/27/2023   TIBC 414 03/27/2023   UIBC 350 03/27/2023   IRONPCTSAT 15 (L) 03/27/2023   Lab Results  Component Value Date   RETICCTPCT 1.1 03/27/2023   RBC 5.66  05/26/2023   No results found for: "KPAFRELGTCHN", "LAMBDASER", "KAPLAMBRATIO" No results found for: "IGGSERUM", "IGA", "IGMSERUM" No results found for: "TOTALPROTELP", "ALBUMINELP", "A1GS", "A2GS", "BETS", "BETA2SER", "GAMS", "MSPIKE", "SPEI"   Chemistry  Component Value Date/Time   NA 141 05/16/2023 1019   K 4.3 05/16/2023 1019   CL 106 05/16/2023 1019   CO2 22 05/16/2023 1019   BUN 14 05/16/2023 1019   CREATININE 1.37 (H) 05/16/2023 1019   CREATININE 1.39 (H) 03/27/2023 0840   CREATININE 1.11 09/09/2014 1017      Component Value Date/Time   CALCIUM  9.0 05/16/2023 1019   ALKPHOS 73 05/16/2023 1019   AST 45 (H) 05/16/2023 1019   AST 34 03/27/2023 0840   ALT 59 (H) 05/16/2023 1019   ALT 42 03/27/2023 0840   BILITOT 0.7 05/16/2023 1019   BILITOT 0.8 03/27/2023 0840      Impression and Plan: Mr. Schiraldi is a very pleasant 63 yo African American gentleman with polycythemia vera.  He is positive for the JAK 2 mutation.  Currently, he is on Jakafi .  He is doing well with the Jakafi .  We will go ahead and phlebotomize him today.  I think this will help.  I think with the phlebotomy, we can probably move his appointment out to 3 months.   Ivor Mars, MD 4/28/20259:16 AM

## 2023-05-26 NOTE — Patient Instructions (Signed)

## 2023-05-28 ENCOUNTER — Other Ambulatory Visit: Payer: Self-pay | Admitting: Hematology & Oncology

## 2023-05-28 DIAGNOSIS — D751 Secondary polycythemia: Secondary | ICD-10-CM

## 2023-05-28 DIAGNOSIS — Z1589 Genetic susceptibility to other disease: Secondary | ICD-10-CM

## 2023-05-29 NOTE — Telephone Encounter (Signed)
 Dr. Doyle Generous pt. This RX was neither prescribed nor refilled by Dr. Rodolfo Clan or any of our other providers. Does Dr. Rodolfo Clan want to refill? Please advise.

## 2023-05-30 NOTE — Telephone Encounter (Signed)
 This former [facility] of [facility] is requesting a refill. This was not prescribed nor refilled by Cardiology. Does Dr. Rodolfo Clan want to refill? Please advise.

## 2023-08-10 ENCOUNTER — Other Ambulatory Visit: Payer: Self-pay | Admitting: Internal Medicine

## 2023-08-25 ENCOUNTER — Inpatient Hospital Stay: Attending: Hematology & Oncology

## 2023-08-25 ENCOUNTER — Encounter

## 2023-08-25 ENCOUNTER — Inpatient Hospital Stay (HOSPITAL_BASED_OUTPATIENT_CLINIC_OR_DEPARTMENT_OTHER): Admitting: Medical Oncology

## 2023-08-25 ENCOUNTER — Encounter: Payer: Self-pay | Admitting: Medical Oncology

## 2023-08-25 VITALS — BP 110/66 | HR 53 | Temp 97.4°F | Resp 19 | Ht 70.0 in | Wt 372.8 lb

## 2023-08-25 DIAGNOSIS — D45 Polycythemia vera: Secondary | ICD-10-CM

## 2023-08-25 DIAGNOSIS — D500 Iron deficiency anemia secondary to blood loss (chronic): Secondary | ICD-10-CM

## 2023-08-25 DIAGNOSIS — D751 Secondary polycythemia: Secondary | ICD-10-CM | POA: Diagnosis present

## 2023-08-25 DIAGNOSIS — I4891 Unspecified atrial fibrillation: Secondary | ICD-10-CM | POA: Diagnosis not present

## 2023-08-25 DIAGNOSIS — Z1589 Genetic susceptibility to other disease: Secondary | ICD-10-CM | POA: Diagnosis not present

## 2023-08-25 DIAGNOSIS — Z7901 Long term (current) use of anticoagulants: Secondary | ICD-10-CM | POA: Diagnosis not present

## 2023-08-25 DIAGNOSIS — M7989 Other specified soft tissue disorders: Secondary | ICD-10-CM | POA: Insufficient documentation

## 2023-08-25 LAB — CMP (CANCER CENTER ONLY)
ALT: 76 U/L — ABNORMAL HIGH (ref 0–44)
AST: 51 U/L — ABNORMAL HIGH (ref 15–41)
Albumin: 4.4 g/dL (ref 3.5–5.0)
Alkaline Phosphatase: 74 U/L (ref 38–126)
Anion gap: 12 (ref 5–15)
BUN: 17 mg/dL (ref 8–23)
CO2: 22 mmol/L (ref 22–32)
Calcium: 8.9 mg/dL (ref 8.9–10.3)
Chloride: 105 mmol/L (ref 98–111)
Creatinine: 1.39 mg/dL — ABNORMAL HIGH (ref 0.61–1.24)
GFR, Estimated: 57 mL/min — ABNORMAL LOW (ref >60)
Glucose, Bld: 143 mg/dL — ABNORMAL HIGH (ref 70–99)
Potassium: 4.3 mmol/L (ref 3.5–5.1)
Sodium: 139 mmol/L (ref 135–145)
Total Bilirubin: 0.6 mg/dL (ref 0.0–1.2)
Total Protein: 6.9 g/dL (ref 6.5–8.1)

## 2023-08-25 LAB — CBC WITH DIFFERENTIAL (CANCER CENTER ONLY)
Abs Immature Granulocytes: 0.01 K/uL (ref 0.00–0.07)
Basophils Absolute: 0.1 K/uL (ref 0.0–0.1)
Basophils Relative: 1 %
Eosinophils Absolute: 0.1 K/uL (ref 0.0–0.5)
Eosinophils Relative: 2 %
HCT: 44.3 % (ref 39.0–52.0)
Hemoglobin: 13.5 g/dL (ref 13.0–17.0)
Immature Granulocytes: 0 %
Lymphocytes Relative: 38 %
Lymphs Abs: 2.6 K/uL (ref 0.7–4.0)
MCH: 24.6 pg — ABNORMAL LOW (ref 26.0–34.0)
MCHC: 30.5 g/dL (ref 30.0–36.0)
MCV: 80.7 fL (ref 80.0–100.0)
Monocytes Absolute: 0.6 K/uL (ref 0.1–1.0)
Monocytes Relative: 9 %
Neutro Abs: 3.3 K/uL (ref 1.7–7.7)
Neutrophils Relative %: 50 %
Platelet Count: 300 K/uL (ref 150–400)
RBC: 5.49 MIL/uL (ref 4.22–5.81)
RDW: 18.7 % — ABNORMAL HIGH (ref 11.5–15.5)
WBC Count: 6.8 K/uL (ref 4.0–10.5)
nRBC: 0 % (ref 0.0–0.2)

## 2023-08-25 LAB — IRON AND IRON BINDING CAPACITY (CC-WL,HP ONLY)
Iron: 56 ug/dL (ref 45–182)
Saturation Ratios: 14 % — ABNORMAL LOW (ref 17.9–39.5)
TIBC: 406 ug/dL (ref 250–450)
UIBC: 350 ug/dL

## 2023-08-25 LAB — FERRITIN: Ferritin: 23 ng/mL — ABNORMAL LOW (ref 24–336)

## 2023-08-25 NOTE — Progress Notes (Signed)
 Hematology and Oncology Follow Up Visit  Vincente Asbridge [id number] 02/26/1960 63 y.o. 08/25/2023   Principle Diagnosis:  Polycythemia vera  -JAK 2 positive    Current Therapy:   Eliquis  5 mg p.o. twice daily      Phlebotomy to maintain Hct < 45% Jakafi  10 mg po BID   Interim History:  Mr. Flanagan is here today for follow-up.  Overall, he is doing okay.  We last saw him back in April.    Since his last visit his PCP took him off of Ozempic ad his A1C had lowered into a normal range. No other heath changes.   He continues to tolerate Jakifi well.   He has had no fever.  He has had no diarrhea.  There has been no rashes.  He still has little bit of leg swelling.  His last iron studies showed a ferritin of 21 with an iron saturation of 22%.  He does have the atrial fibrillation.  He is on Eliquis .  There has been no problems with headache.  He has had no cough.  There is no increased shortness of breath.  Overall, I would say his performance status is probably ECOG 1.   Wt Readings from Last 3 Encounters:  08/25/23 (!) 372 lb 12.8 oz (169.1 kg)  05/26/23 (!) 349 lb 1.9 oz (158.4 kg)  05/16/23 (!) 349 lb (158.3 kg)   Medications:  Allergies as of 08/25/2023       Reactions   Diphenhydramine Hcl Swelling   Lips swell   Lisinopril  Cough        Medication List        Accurate as of August 25, 2023  9:27 AM. If you have any questions, ask your nurse or doctor.          STOP taking these medications    Semaglutide (2 MG/DOSE) 8 MG/3ML Sopn Stopped by: Lauraine CHRISTELLA Dais       TAKE these medications    Accu-Chek Guide test strip Generic drug: glucose blood 1 each by Other route as directed.   Accu-Chek Softclix Lancets lancets 1 each by Other route as directed.   amiodarone  200 MG tablet Commonly known as: PACERONE  TAKE 1 TABLET BY MOUTH DAILY 5 DAYS PER WEEK. MONDAY- FRIDAY   atorvastatin  80 MG tablet Commonly known as: LIPITOR TAKE 1 TABLET BY MOUTH  EVERY DAY   diclofenac  Sodium 1 % Gel Commonly known as: VOLTAREN  Apply 2 g topically 4 (four) times daily.   Eliquis  5 MG Tabs tablet Generic drug: apixaban  TAKE 1 TABLET BY MOUTH TWICE A DAY   empagliflozin  10 MG Tabs tablet Commonly known as: Jardiance  Take 1 tablet (10 mg total) by mouth daily.   fluticasone  50 MCG/ACT nasal spray Commonly known as: FLONASE  Place 1 spray into both nostrils as needed for allergies or rhinitis.   furosemide  40 MG tablet Commonly known as: LASIX  TAKE 2 TABLETS BY MOUTH EVERY MORNING AND 1 TABLET EVERY EVENING.   Jakafi  10 MG tablet Generic drug: ruxolitinib  phosphate TAKE 1 TABLET BY MOUTH 2 TIMES A DAY   methimazole  10 MG tablet Commonly known as: TAPAZOLE  TAKE 3 TABLETS BY MOUTH EVERY DAY   metoprolol  succinate 50 MG 24 hr tablet Commonly known as: TOPROL -XL Take 1 tablet (50 mg total) by mouth 2 (two) times daily. Take with or immediately following a meal.   mometasone  0.1 % cream Commonly known as: ELOCON  Apply 1 Application topically daily.   potassium chloride  SA 20 MEQ  tablet Commonly known as: KLOR-CON  M Take 1 tablet (20 mEq total) by mouth daily.   sacubitril -valsartan  49-51 MG Commonly known as: Entresto  Take 1 tablet by mouth 2 (two) times daily.   spironolactone  25 MG tablet Commonly known as: ALDACTONE  Take 0.5 tablets (12.5 mg total) by mouth daily.   tadalafil 5 MG tablet Commonly known as: CIALIS Take 5 mg by mouth as directed.   Vitamin D3 10 MCG (400 UNIT) tablet Take 400 Units by mouth daily.        Allergies:  Allergies  Allergen Reactions   Diphenhydramine Hcl Swelling    Lips swell     Lisinopril  Cough    Past Medical History, Surgical history, Social history, and Family History were reviewed and updated.  Review of Systems: Review of Systems  Constitutional: Negative.   HENT: Negative.    Eyes: Negative.   Respiratory: Negative.    Cardiovascular: Negative.   Gastrointestinal:  Negative.   Genitourinary: Negative.   Musculoskeletal: Negative.   Skin: Negative.   Neurological: Negative.   Endo/Heme/Allergies: Negative.   Psychiatric/Behavioral: Negative.     Physical Exam: Vitals:   08/25/23 0904  BP: 110/66  Pulse: (!) 53  Resp: 19  Temp: (!) 97.4 F (36.3 C)  SpO2: 99%    Wt Readings from Last 3 Encounters:  08/25/23 (!) 372 lb 12.8 oz (169.1 kg)  05/26/23 (!) 349 lb 1.9 oz (158.4 kg)  05/16/23 (!) 349 lb (158.3 kg)   Physical Exam Vitals reviewed.  HENT:     Head: Normocephalic and atraumatic.  Eyes:     Pupils: Pupils are equal, round, and reactive to light.  Cardiovascular:     Rate and Rhythm: Normal rate and regular rhythm.     Heart sounds: Normal heart sounds.  Pulmonary:     Effort: Pulmonary effort is normal.     Breath sounds: Normal breath sounds.  Abdominal:     General: Bowel sounds are normal.     Palpations: Abdomen is soft.  Musculoskeletal:        General: No tenderness or deformity. Normal range of motion.     Cervical back: Normal range of motion.  Lymphadenopathy:     Cervical: No cervical adenopathy.  Skin:    General: Skin is warm and dry.     Findings: No erythema or rash.  Neurological:     Mental Status: He is alert and oriented to person, place, and time.  Psychiatric:        Behavior: Behavior normal.        Thought Content: Thought content normal.        Judgment: Judgment normal.     Lab Results  Component Value Date   WBC 6.8 08/25/2023   HGB 13.5 08/25/2023   HCT 44.3 08/25/2023   MCV 80.7 08/25/2023   PLT 300 08/25/2023   Lab Results  Component Value Date   FERRITIN 21 (L) 05/26/2023   IRON 88 05/26/2023   TIBC 406 05/26/2023   UIBC 318 05/26/2023   IRONPCTSAT 22 05/26/2023   Lab Results  Component Value Date   RETICCTPCT 1.1 03/27/2023   RBC 5.49 08/25/2023   No results found for: KPAFRELGTCHN, LAMBDASER, KAPLAMBRATIO No results found for: IGGSERUM, IGA, IGMSERUM No  results found for: TOTALPROTELP, ALBUMINELP, A1GS, A2GS, BETS, BETA2SER, GAMS, MSPIKE, SPEI   Chemistry      Component Value Date/Time   NA 139 08/25/2023 0842   NA 141 05/16/2023 1019   K 4.3 08/25/2023  0842   CL 105 08/25/2023 0842   CO2 22 08/25/2023 0842   BUN 17 08/25/2023 0842   BUN 14 05/16/2023 1019   CREATININE 1.39 (H) 08/25/2023 0842   CREATININE 1.11 09/09/2014 1017      Component Value Date/Time   CALCIUM  8.9 08/25/2023 0842   ALKPHOS 74 08/25/2023 0842   AST 51 (H) 08/25/2023 0842   ALT 76 (H) 08/25/2023 0842   BILITOT 0.6 08/25/2023 0842     Encounter Diagnoses  Name Primary?   Erythrocytosis Yes   Polycythemia vera (HCC)    JAK-2 gene mutation    Iron deficiency anemia due to chronic blood loss     Impression and Plan: Mr. Fitz is a very pleasant 63 yo African American gentleman with polycythemia vera.  He is positive for the JAK 2 mutation.  Currently, he is on Jakafi .  He is doing well with the Jakafi .  No phlebotomy needed today CBC and CMP reviewed with patient. He will try to lower his carbohydrate intake and follow up with his PCP regarding his A1C and LFTS No dose adjustment needed today on his Jakafi    RTC 3 months APP/MD, labs (CBC w/, CMP, Iron, ferritin), phlebotomy    Lauraine CHRISTELLA Dais, PA-C 7/28/20259:27 AM

## 2023-09-01 ENCOUNTER — Other Ambulatory Visit: Payer: Self-pay | Admitting: Hematology & Oncology

## 2023-09-01 DIAGNOSIS — Z1589 Genetic susceptibility to other disease: Secondary | ICD-10-CM

## 2023-09-01 DIAGNOSIS — D751 Secondary polycythemia: Secondary | ICD-10-CM

## 2023-10-23 ENCOUNTER — Other Ambulatory Visit (HOSPITAL_COMMUNITY): Payer: Self-pay | Admitting: Adult Health

## 2023-10-23 ENCOUNTER — Other Ambulatory Visit (HOSPITAL_COMMUNITY): Payer: Self-pay

## 2023-10-23 MED ORDER — FUROSEMIDE 40 MG PO TABS: ORAL_TABLET | ORAL | 0 refills | Status: AC

## 2023-11-06 ENCOUNTER — Telehealth: Payer: Self-pay | Admitting: Internal Medicine

## 2023-11-06 ENCOUNTER — Encounter: Payer: Self-pay | Admitting: Family

## 2023-11-06 DIAGNOSIS — I4819 Other persistent atrial fibrillation: Secondary | ICD-10-CM

## 2023-11-06 NOTE — Telephone Encounter (Signed)
*  STAT* If patient is at the pharmacy, call can be transferred to refill team.   1. Which medications need to be refilled? (please list name of each medication and dose if known)   ELIQUIS  5 MG TABS tablet   2. Would you like to learn more about the convenience, safety, & potential cost savings by using the [facility]?   3. Are you open to using the [facility] (Type [facility]. ).  4. Which pharmacy/location (including street and city if local pharmacy) is medication to be sent to?  [id number] - [place],  - [address]   5. Do they need a 30 day or 90 day supply?  90 day  Patient stated he is completely out of this medication.  Patient has appointment scheduled with A. Tillery, PA on 10/20.

## 2023-11-07 ENCOUNTER — Other Ambulatory Visit (HOSPITAL_COMMUNITY): Payer: Self-pay | Admitting: Adult Health

## 2023-11-07 MED ORDER — APIXABAN 5 MG PO TABS: 5.0000 mg | ORAL_TABLET | Freq: Two times a day (BID) | ORAL | 1 refills | Status: AC

## 2023-11-16 NOTE — Progress Notes (Unsigned)
  Electrophysiology Office Note:   Date:  11/17/2023  ID:  John Conner, DOB 04-01-1960, MRN [id number]  Primary Cardiologist: None Electrophysiologist: Donnice DELENA Primus, MD   Electrophysiologist:  Donnice DELENA Primus, MD      History of Present Illness:   John Conner is a 63 y.o. male with h/o  NICM, PVCs, Chronic systolic CHF, mild MR, Obesity, Polycythemia Vera, and amiodarone  related hyperthyroidism seen today for routine electrophysiology followup.   Since last being seen in our clinic the patient reports doing well overall. Is having some edema. Cut back his lasix  from 80 mg q am and 40 mg q pm all the way to 40 mg daily because he was getting up too much in the night.  Lost weight on Ozempic but insurance stopped covering it. Otherwise, he denies chest pain, palpitations, PND, orthopnea, nausea, vomiting, dizziness, syncope, weight gain, or early satiety.  Mild dyspnea with moderate or more exertion.   Review of systems complete and found to be negative unless listed in HPI.   EP Information / Studies Reviewed:    EKG is ordered today. Personal review as below.  EKG Interpretation Date/Time:  Monday November 17 2023 08:48:01 EDT Ventricular Rate:  54 PR Interval:  184 QRS Duration:  96 QT Interval:  432 QTC Calculation: 409 R Axis:   -10  Text Interpretation: Sinus bradycardia with occasional Premature ventricular complexes Low voltage QRS Confirmed by Lesia Sharper [phone]) on 11/17/2023 8:54:31 AM    Arrhythmia/Device History No specialty comments available.   Physical Exam:   VS:  BP 114/76 (BP Location: Left Arm, Patient Position: Sitting, Cuff Size: Large)   Pulse (!) 54   Ht 5' 10 (1.778 m)   Wt (!) 382 lb (173.3 kg)   SpO2 96%   BMI 54.81 kg/m    Wt Readings from Last 3 Encounters:  11/17/23 (!) 382 lb (173.3 kg)  08/25/23 (!) 372 lb 12.8 oz (169.1 kg)  05/26/23 (!) 349 lb 1.9 oz (158.4 kg)     GEN: No acute distress NECK: No JVD; No carotid  bruits CARDIAC: Regular rate and rhythm, no murmurs, rubs, gallops RESPIRATORY:  Clear to auscultation without rales, wheezing or rhonchi  ABDOMEN: Soft, non-tender, non-distended EXTREMITIES:  No edema; No deformity   ASSESSMENT AND PLAN:    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  Normal coronaries on prior cath 2023. Mild volume overload and variable medical literacy. Stressed importance of him reconnecting with the HF team for management and support.   Repeat BMET in 2 weeks with adjustment of his lasix  back to 80 mg daily for now.    PVCs EKG today shows NSR with occasional PVCs Continue amiodarone  200 mg M-F. Surveillance labs stable last month.    Hyperthyroidism ? Amio related Surveillance labs today.    Obesity Body mass index is 54.81 kg/m.  Encouraged lifestyle modification   OSA  Encouraged nightly CPAP    Polycythemia vera (positive JAK2) Follows with hematology  Follow up with EP Team in 6 months - Re-establish with HF clinic  Signed, Sharper Prentice Lesia, PA-C

## 2023-11-17 ENCOUNTER — Encounter: Payer: Self-pay | Admitting: Student

## 2023-11-17 ENCOUNTER — Ambulatory Visit: Attending: Student | Admitting: Student

## 2023-11-17 VITALS — BP 114/76 | HR 54 | Ht 70.0 in | Wt 382.0 lb

## 2023-11-17 DIAGNOSIS — I10 Essential (primary) hypertension: Secondary | ICD-10-CM | POA: Diagnosis present

## 2023-11-17 DIAGNOSIS — Z79899 Other long term (current) drug therapy: Secondary | ICD-10-CM | POA: Insufficient documentation

## 2023-11-17 DIAGNOSIS — R7989 Other specified abnormal findings of blood chemistry: Secondary | ICD-10-CM | POA: Insufficient documentation

## 2023-11-17 DIAGNOSIS — I5022 Chronic systolic (congestive) heart failure: Secondary | ICD-10-CM | POA: Diagnosis present

## 2023-11-17 DIAGNOSIS — I428 Other cardiomyopathies: Secondary | ICD-10-CM | POA: Diagnosis not present

## 2023-11-17 DIAGNOSIS — I493 Ventricular premature depolarization: Secondary | ICD-10-CM | POA: Insufficient documentation

## 2023-11-17 NOTE — Patient Instructions (Addendum)
 Medication Instructions:  Your physician recommends that you continue on your current medications as directed. Please refer to the Current Medication list given to you today.  *If you need a refill on your cardiac medications before your next appointment, please call your pharmacy*  Lab Work: BMET-IN 2 WEEKS If you have labs (blood work) drawn today and your tests are completely normal, you will receive your results only by: MyChart Message (if you have MyChart) OR A paper copy in the mail If you have any lab test that is abnormal or we need to change your treatment, we will call you to review the results.  Follow-Up: At [facility], you and your health needs are our priority.  As part of our continuing mission to provide you with exceptional heart care, our providers are all part of one team.  This team includes your primary Cardiologist (physician) and Advanced Practice Providers or APPs (Physician Assistants and Nurse Practitioners) who all work together to provide you with the care you need, when you need it.  Your next appointment:   You have been referred to the heart failure clinic.

## 2023-11-25 ENCOUNTER — Inpatient Hospital Stay: Admitting: Medical Oncology

## 2023-11-25 ENCOUNTER — Inpatient Hospital Stay

## 2023-11-26 ENCOUNTER — Ambulatory Visit: Payer: Self-pay | Admitting: Student

## 2023-11-26 LAB — BASIC METABOLIC PANEL WITH GFR
BUN/Creatinine Ratio: 11 (ref 10–24)
BUN: 15 mg/dL (ref 8–27)
CO2: 20 mmol/L (ref 20–29)
Calcium: 9 mg/dL (ref 8.6–10.2)
Chloride: 106 mmol/L (ref 96–106)
Creatinine, Ser: 1.4 mg/dL — ABNORMAL HIGH (ref 0.76–1.27)
Glucose: 97 mg/dL (ref 70–99)
Potassium: 4.6 mmol/L (ref 3.5–5.2)
Sodium: 140 mmol/L (ref 134–144)
eGFR: 56 mL/min/1.73 — ABNORMAL LOW (ref >59)

## 2023-12-01 ENCOUNTER — Encounter: Payer: Self-pay | Admitting: Family

## 2023-12-08 ENCOUNTER — Inpatient Hospital Stay: Attending: Medical Oncology

## 2023-12-08 ENCOUNTER — Other Ambulatory Visit: Payer: Self-pay

## 2023-12-08 ENCOUNTER — Inpatient Hospital Stay (HOSPITAL_BASED_OUTPATIENT_CLINIC_OR_DEPARTMENT_OTHER): Admitting: Medical Oncology

## 2023-12-08 ENCOUNTER — Ambulatory Visit: Payer: Self-pay | Admitting: Medical Oncology

## 2023-12-08 ENCOUNTER — Inpatient Hospital Stay

## 2023-12-08 VITALS — BP 122/58 | HR 51 | Temp 98.0°F | Resp 20 | Ht 70.0 in | Wt 384.0 lb

## 2023-12-08 DIAGNOSIS — M7989 Other specified soft tissue disorders: Secondary | ICD-10-CM | POA: Diagnosis not present

## 2023-12-08 DIAGNOSIS — Z1589 Genetic susceptibility to other disease: Secondary | ICD-10-CM | POA: Diagnosis not present

## 2023-12-08 DIAGNOSIS — I4891 Unspecified atrial fibrillation: Secondary | ICD-10-CM | POA: Insufficient documentation

## 2023-12-08 DIAGNOSIS — D751 Secondary polycythemia: Secondary | ICD-10-CM

## 2023-12-08 DIAGNOSIS — D45 Polycythemia vera: Secondary | ICD-10-CM

## 2023-12-08 DIAGNOSIS — D500 Iron deficiency anemia secondary to blood loss (chronic): Secondary | ICD-10-CM

## 2023-12-08 DIAGNOSIS — Z7901 Long term (current) use of anticoagulants: Secondary | ICD-10-CM | POA: Diagnosis not present

## 2023-12-08 LAB — CBC WITH DIFFERENTIAL (CANCER CENTER ONLY)
Abs Immature Granulocytes: 0.03 K/uL (ref 0.00–0.07)
Basophils Absolute: 0 K/uL (ref 0.0–0.1)
Basophils Relative: 1 %
Eosinophils Absolute: 0.1 K/uL (ref 0.0–0.5)
Eosinophils Relative: 1 %
HCT: 46.4 % (ref 39.0–52.0)
Hemoglobin: 14.4 g/dL (ref 13.0–17.0)
Immature Granulocytes: 1 %
Lymphocytes Relative: 33 %
Lymphs Abs: 2.1 K/uL (ref 0.7–4.0)
MCH: 25.2 pg — ABNORMAL LOW (ref 26.0–34.0)
MCHC: 31 g/dL (ref 30.0–36.0)
MCV: 81.1 fL (ref 80.0–100.0)
Monocytes Absolute: 0.7 K/uL (ref 0.1–1.0)
Monocytes Relative: 11 %
Neutro Abs: 3.5 K/uL (ref 1.7–7.7)
Neutrophils Relative %: 53 %
Platelet Count: 303 K/uL (ref 150–400)
RBC: 5.72 MIL/uL (ref 4.22–5.81)
RDW: 19.2 % — ABNORMAL HIGH (ref 11.5–15.5)
WBC Count: 6.4 K/uL (ref 4.0–10.5)
nRBC: 0 % (ref 0.0–0.2)

## 2023-12-08 LAB — IRON AND IRON BINDING CAPACITY (CC-WL,HP ONLY)
Iron: 75 ug/dL (ref 45–182)
Saturation Ratios: 19 % (ref 17.9–39.5)
TIBC: 399 ug/dL (ref 250–450)
UIBC: 324 ug/dL

## 2023-12-08 LAB — CMP (CANCER CENTER ONLY)
ALT: 81 U/L — ABNORMAL HIGH (ref 0–44)
AST: 57 U/L — ABNORMAL HIGH (ref 15–41)
Albumin: 4.5 g/dL (ref 3.5–5.0)
Alkaline Phosphatase: 74 U/L (ref 38–126)
Anion gap: 13 (ref 5–15)
BUN: 13 mg/dL (ref 8–23)
CO2: 21 mmol/L — ABNORMAL LOW (ref 22–32)
Calcium: 8.8 mg/dL — ABNORMAL LOW (ref 8.9–10.3)
Chloride: 106 mmol/L (ref 98–111)
Creatinine: 1.33 mg/dL — ABNORMAL HIGH (ref 0.61–1.24)
GFR, Estimated: >60 mL/min (ref >60)
Glucose, Bld: 110 mg/dL — ABNORMAL HIGH (ref 70–99)
Potassium: 4.4 mmol/L (ref 3.5–5.1)
Sodium: 140 mmol/L (ref 135–145)
Total Bilirubin: 0.8 mg/dL (ref 0.0–1.2)
Total Protein: 7.1 g/dL (ref 6.5–8.1)

## 2023-12-08 LAB — FERRITIN: Ferritin: 24 ng/mL (ref 24–336)

## 2023-12-08 MED ORDER — SODIUM CHLORIDE 0.9 % IV SOLN: Freq: Once | INTRAVENOUS | Status: AC

## 2023-12-08 NOTE — Progress Notes (Signed)
 Pt. arrived for Therapeutic Phlebotomy. Hct. 46.4 today. Phlebotomized 500g wih 18g intracath/empty bag method via Rt medial antecubital site.  Procedure took app.15 mins. Pt. tolerated procedure well.  Nourishment given pre and post procedure and 500ml IVF given. Stable on d/c.

## 2023-12-08 NOTE — Progress Notes (Signed)
 Hematology and Oncology Follow Up Visit  John Conner [id number] 1960-07-14 63 y.o. 12/08/2023   Principle Diagnosis:  Polycythemia vera  -JAK 2 positive    Current Therapy:   Eliquis  5 mg p.o. twice daily      Phlebotomy to maintain Hct < 45% Jakafi  10 mg po BID   Interim History:  John Conner is here today for follow-up.  Overall, he is doing okay.  We last saw him back in April.    He is doing well overall  He is still off of the Ozempic due to insurance reasons. He hopes to restart it soon.   He continues to tolerate Jakifi well.   He has had no fever.  He has had no diarrhea.  There has been no rashes.  He still has little bit of leg swelling.  His last iron studies showed a ferritin of 21 with an iron saturation of 22%.  He does have the atrial fibrillation.  He is on Eliquis .  There has been no problems with headache.  He has had no cough.  There is no increased shortness of breath.  Overall, I would say his performance status is probably ECOG 1.   Wt Readings from Last 3 Encounters:  12/08/23 (!) 384 lb (174.2 kg)  11/17/23 (!) 382 lb (173.3 kg)  08/25/23 (!) 372 lb 12.8 oz (169.1 kg)   Medications:  Allergies as of 12/08/2023       Reactions   Diphenhydramine Hcl Swelling   Lips swell   Lisinopril  Cough        Medication List        Accurate as of December 08, 2023 11:22 AM. If you have any questions, ask your nurse or doctor.          STOP taking these medications    amiodarone  200 MG tablet Commonly known as: PACERONE  Stopped by: Lauraine CHRISTELLA Dais   carvedilol  12.5 MG tablet Commonly known as: COREG  Stopped by: Lauraine CHRISTELLA Dais       TAKE these medications    Accu-Chek Guide test strip Generic drug: glucose blood 1 each by Other route as directed.   Accu-Chek Softclix Lancets lancets 1 each by Other route as directed.   apixaban  5 MG Tabs tablet Commonly known as: Eliquis  Take 1 tablet (5 mg total) by mouth 2 (two) times  daily.   atorvastatin  80 MG tablet Commonly known as: LIPITOR TAKE 1 TABLET BY MOUTH EVERY DAY   diazepam  2 MG tablet Commonly known as: VALIUM  Take 2 mg by mouth as directed.   diclofenac  Sodium 1 % Gel Commonly known as: VOLTAREN  Apply 2 g topically 4 (four) times daily.   empagliflozin  10 MG Tabs tablet Commonly known as: Jardiance  Take 1 tablet (10 mg total) by mouth daily.   fluticasone  50 MCG/ACT nasal spray Commonly known as: FLONASE  Place 1 spray into both nostrils as needed for allergies or rhinitis.   furosemide  40 MG tablet Commonly known as: LASIX  PLEASE SCHEDULE APPOINTMENT FOR MORE REFILLS TAKE 2 TABLETS BY MOUTH EVERY MORNING AND 1 TABLET EVERY EVENING.   Jakafi  10 MG tablet Generic drug: ruxolitinib  phosphate TAKE 1 TABLET BY MOUTH 2 TIMES A DAY   methimazole  10 MG tablet Commonly known as: TAPAZOLE  TAKE 3 TABLETS BY MOUTH EVERY DAY   metoprolol  succinate 50 MG 24 hr tablet Commonly known as: TOPROL -XL Take 1 tablet (50 mg total) by mouth 2 (two) times daily. Take with or immediately following a meal.   mometasone   0.1 % cream Commonly known as: ELOCON  Apply 1 Application topically daily.   Mounjaro 2.5 MG/0.5ML Pen Generic drug: tirzepatide Inject 2.5 mg into the skin once a week.   potassium chloride  SA 20 MEQ tablet Commonly known as: KLOR-CON  M Take 1 tablet (20 mEq total) by mouth daily.   sacubitril -valsartan  49-51 MG Commonly known as: Entresto  Take 1 tablet by mouth 2 (two) times daily.   spironolactone  25 MG tablet Commonly known as: ALDACTONE  Take 0.5 tablets (12.5 mg total) by mouth daily.   tadalafil 5 MG tablet Commonly known as: CIALIS Take 5 mg by mouth as directed.   Vitamin D3 10 MCG (400 UNIT) tablet Take 400 Units by mouth daily.        Allergies:  Allergies  Allergen Reactions   Diphenhydramine Hcl Swelling    Lips swell     Lisinopril  Cough    Past Medical History, Surgical history, Social history, and  Family History were reviewed and updated.  Review of Systems: Review of Systems  Constitutional: Negative.   HENT: Negative.    Eyes: Negative.   Respiratory: Negative.    Cardiovascular: Negative.   Gastrointestinal: Negative.   Genitourinary: Negative.   Musculoskeletal: Negative.   Skin: Negative.   Neurological: Negative.   Endo/Heme/Allergies: Negative.   Psychiatric/Behavioral: Negative.     Physical Exam: Vitals:   12/08/23 1000  BP: 139/78  Pulse: (!) 50  Resp: 20  Temp: 98.4 F (36.9 C)  SpO2: 96%     Wt Readings from Last 3 Encounters:  12/08/23 (!) 384 lb (174.2 kg)  11/17/23 (!) 382 lb (173.3 kg)  08/25/23 (!) 372 lb 12.8 oz (169.1 kg)   Physical Exam Vitals reviewed.  HENT:     Head: Normocephalic and atraumatic.  Eyes:     Pupils: Pupils are equal, round, and reactive to light.  Cardiovascular:     Rate and Rhythm: Normal rate and regular rhythm.     Heart sounds: Normal heart sounds.  Pulmonary:     Effort: Pulmonary effort is normal.     Breath sounds: Normal breath sounds.  Abdominal:     General: Bowel sounds are normal.     Palpations: Abdomen is soft.  Musculoskeletal:        General: No tenderness or deformity. Normal range of motion.     Cervical back: Normal range of motion.  Lymphadenopathy:     Cervical: No cervical adenopathy.  Skin:    General: Skin is warm and dry.     Findings: No erythema or rash.  Neurological:     Mental Status: He is alert and oriented to person, place, and time.  Psychiatric:        Behavior: Behavior normal.        Thought Content: Thought content normal.        Judgment: Judgment normal.     Lab Results  Component Value Date   WBC 6.4 12/08/2023   HGB 14.4 12/08/2023   HCT 46.4 12/08/2023   MCV 81.1 12/08/2023   PLT 303 12/08/2023   Lab Results  Component Value Date   FERRITIN 23 (L) 08/25/2023   IRON 56 08/25/2023   TIBC 406 08/25/2023   UIBC 350 08/25/2023   IRONPCTSAT 14 (L)  08/25/2023   Lab Results  Component Value Date   RETICCTPCT 1.1 03/27/2023   RBC 5.72 12/08/2023   No results found for: KPAFRELGTCHN, LAMBDASER, KAPLAMBRATIO No results found for: IGGSERUM, IGA, IGMSERUM No results found for: TOTALPROTELP,  CARLOTA BENSON MARKEL EARLA JOANNIE DOC VICK, SPEI   Chemistry      Component Value Date/Time   NA 140 12/08/2023 1017   NA 140 11/25/2023 1355   K 4.4 12/08/2023 1017   CL 106 12/08/2023 1017   CO2 21 (L) 12/08/2023 1017   BUN 13 12/08/2023 1017   BUN 15 11/25/2023 1355   CREATININE 1.33 (H) 12/08/2023 1017   CREATININE 1.11 09/09/2014 1017      Component Value Date/Time   CALCIUM  8.8 (L) 12/08/2023 1017   ALKPHOS 74 12/08/2023 1017   AST 57 (H) 12/08/2023 1017   ALT 81 (H) 12/08/2023 1017   BILITOT 0.8 12/08/2023 1017     Encounter Diagnoses  Name Primary?   JAK-2 gene mutation Yes   Erythrocytosis    Polycythemia vera (HCC)    Iron deficiency anemia due to chronic blood loss      Impression and Plan: John Conner is a very pleasant 63 yo African American gentleman with polycythemia vera.  He is positive for the JAK 2 mutation.  Currently, he is on Jakafi .  He is doing well with the Jakafi .  Phlebotomy needed today  CBC and CMP reviewed and stable. He will continue follow up with his PCP regarding his other chronic changes which are stable.  No dose adjustment needed today on his Jakafi    RTC 3 months APP/MD, labs (CBC w/, CMP, Iron, ferritin), phlebotomy   Lauraine CHRISTELLA Dais, PA-C 11/10/202511:22 AM

## 2023-12-08 NOTE — Patient Instructions (Signed)
 [facility] [place] - A DEPT OF [facility]  Discharge Instructions: Thank you for choosing [facility] to provide your oncology and hematology care.   If you have a lab appointment with the Cancer Center, please go directly to the Cancer Center and check in at the registration area.  Wear comfortable clothing and clothing appropriate for easy access to any Portacath or PICC line.   We strive to give you quality time with your provider. You may need to reschedule your appointment if you arrive late (15 or more minutes).  Arriving late affects you and other patients whose appointments are after yours.  Also, if you miss three or more appointments without notifying the office, you may be dismissed from the clinic at the provider's discretion.      For prescription refill requests, have your pharmacy contact our office and allow 72 hours for refills to be completed.    Today you received the following  procedure: Therapeutic phlebotomy .      To help prevent nausea and vomiting after your treatment, we encourage you to take your nausea medication as directed.  BELOW ARE SYMPTOMS THAT SHOULD BE REPORTED IMMEDIATELY: *FEVER GREATER THAN 100.4 F (38 C) OR HIGHER *CHILLS OR SWEATING *NAUSEA AND VOMITING THAT IS NOT CONTROLLED WITH YOUR NAUSEA MEDICATION *UNUSUAL SHORTNESS OF BREATH *UNUSUAL BRUISING OR BLEEDING *URINARY PROBLEMS (pain or burning when urinating, or frequent urination) *BOWEL PROBLEMS (unusual diarrhea, constipation, pain near the anus) TENDERNESS IN MOUTH AND THROAT WITH OR WITHOUT PRESENCE OF ULCERS (sore throat, sores in mouth, or a toothache) UNUSUAL RASH, SWELLING OR PAIN  UNUSUAL VAGINAL DISCHARGE OR ITCHING   Items with * indicate a potential emergency and should be followed up as soon as possible or go to the Emergency Department if any problems should occur.  Please show the CHEMOTHERAPY ALERT CARD or IMMUNOTHERAPY ALERT CARD at  check-in to the Emergency Department and triage nurse. Should you have questions after your visit or need to cancel or reschedule your appointment, please contact [facility] [place] - A DEPT OF [facility]  [phone] and follow the prompts.  Office hours are 8:00 a.m. to 4:30 p.m. Monday - Friday. Please note that voicemails left after 4:00 p.m. may not be returned until the following business day.  We are closed weekends and major holidays. You have access to a nurse at all times for urgent questions. Please call the main number to the clinic [phone] and follow the prompts.  For any non-urgent questions, you may also contact your provider using MyChart. We now offer e-Visits for anyone 45 and older to request care online for non-urgent symptoms. For details visit [email or web address].   Also download the MyChart app! Go to the app store, search "MyChart", open the app, select , and log in with your MyChart username and password.

## 2023-12-12 ENCOUNTER — Other Ambulatory Visit: Payer: Self-pay | Admitting: Hematology & Oncology

## 2023-12-12 DIAGNOSIS — D751 Secondary polycythemia: Secondary | ICD-10-CM

## 2023-12-12 DIAGNOSIS — Z1589 Genetic susceptibility to other disease: Secondary | ICD-10-CM

## 2024-01-30 ENCOUNTER — Other Ambulatory Visit: Payer: Self-pay

## 2024-01-30 MED ORDER — ATORVASTATIN CALCIUM 80 MG PO TABS: 80.0000 mg | ORAL_TABLET | Freq: Every day | ORAL | 2 refills | Status: AC

## 2024-03-09 ENCOUNTER — Inpatient Hospital Stay

## 2024-03-09 ENCOUNTER — Inpatient Hospital Stay: Admitting: Hematology & Oncology
